# Patient Record
Sex: Female | Born: 1965
Health system: Southern US, Community
[De-identification: ages and names within clinical notes are randomized; demographics above are authoritative.]

## PROBLEM LIST (undated history)

## (undated) DIAGNOSIS — F419 Anxiety disorder, unspecified: Secondary | ICD-10-CM

## (undated) DIAGNOSIS — R059 Cough, unspecified: Secondary | ICD-10-CM

## (undated) DIAGNOSIS — E559 Vitamin D deficiency, unspecified: Secondary | ICD-10-CM

## (undated) DIAGNOSIS — R52 Pain, unspecified: Secondary | ICD-10-CM

## (undated) DIAGNOSIS — R1011 Right upper quadrant pain: Secondary | ICD-10-CM

## (undated) DIAGNOSIS — R599 Enlarged lymph nodes, unspecified: Secondary | ICD-10-CM

## (undated) DIAGNOSIS — R0602 Shortness of breath: Secondary | ICD-10-CM

## (undated) DIAGNOSIS — L299 Pruritus, unspecified: Secondary | ICD-10-CM

## (undated) DIAGNOSIS — M25511 Pain in right shoulder: Secondary | ICD-10-CM

## (undated) DIAGNOSIS — I2510 Atherosclerotic heart disease of native coronary artery without angina pectoris: Secondary | ICD-10-CM

## (undated) DIAGNOSIS — J4520 Mild intermittent asthma, uncomplicated: Secondary | ICD-10-CM

## (undated) DIAGNOSIS — E119 Type 2 diabetes mellitus without complications: Secondary | ICD-10-CM

## (undated) DIAGNOSIS — I749 Embolism and thrombosis of unspecified artery: Secondary | ICD-10-CM

## (undated) DIAGNOSIS — C569 Malignant neoplasm of unspecified ovary: Secondary | ICD-10-CM

## (undated) DIAGNOSIS — I219 Acute myocardial infarction, unspecified: Secondary | ICD-10-CM

## (undated) DIAGNOSIS — N182 Chronic kidney disease, stage 2 (mild): Secondary | ICD-10-CM

## (undated) DIAGNOSIS — J45909 Unspecified asthma, uncomplicated: Secondary | ICD-10-CM

## (undated) HISTORY — PX: APPENDECTOMY: SHX54

## (undated) HISTORY — PX: ABDOMINAL HYSTERECTOMY: SHX81

## (undated) HISTORY — DX: Chronic kidney disease, stage 2 (mild): N18.2

## (undated) HISTORY — DX: Atherosclerotic heart disease of native coronary artery without angina pectoris: I25.10

## (undated) HISTORY — DX: Malignant neoplasm of unspecified ovary: C56.9

---

## 2009-01-09 LAB — STREP THROAT SCREEN: Strep Screen: NEGATIVE

## 2009-10-01 LAB — URINALYSIS W/ RFLX MICROSCOPIC
Bilirubin: NEGATIVE
Glucose: NEGATIVE MG/DL
Ketone: NEGATIVE MG/DL
Leukocyte Esterase: NEGATIVE
Nitrites: NEGATIVE
Protein: NEGATIVE MG/DL
Specific gravity: >1.03 — ABNORMAL HIGH (ref 1.003–1.030)
Urobilinogen: 0.2 EU/DL (ref 0.2–1.0)
pH (UA): 6 (ref 5.0–8.0)

## 2009-10-01 LAB — DRUG SCREEN, URINE
AMPHETAMINES: NEGATIVE
BARBITURATES: NEGATIVE
BENZODIAZEPINES: POSITIVE — AB
COCAINE: NEGATIVE
METHADONE: NEGATIVE
OPIATES: NEGATIVE
PCP(PHENCYCLIDINE): NEGATIVE
THC (TH-CANNABINOL): NEGATIVE

## 2009-10-01 LAB — URINE MICROSCOPIC ONLY
RBC: 4 /HPF (ref 0–5)
WBC: NEGATIVE /HPF (ref 0–4)

## 2011-07-27 ENCOUNTER — Encounter

## 2011-07-27 NOTE — Procedures (Signed)
[facility]  *** FINAL REPORT ***    Name: CASSADEE, VANZANDT  MRN: [id number]  DOB: 1965-12-13  Visit: [id number]  Date: 27 Jul 2011    TYPE OF TEST: Peripheral Venous Testing    REASON FOR TEST  Pain in limb, Limb swelling    Right Leg:-  Deep venous thrombosis:           No  Superficial venous thrombosis:    No  Deep venous insufficiency:        No  Superficial venous insufficiency: No      INTERPRETATION/FINDINGS  PROCEDURE:  Evaluation of lower extremity veins with ultrasound  (B-mode imaging, pulsed Doppler, color Doppler).  Includes the common  femoral, deep femoral, femoral, popliteal, posterior tibial, peroneal,   and great saphenous veins.  Other veins, for example the  gastrocnemius and soleal veins, may also be visualized.    FINDINGS:       Right: Veins are compressible and there is no narrowing of the  flow channel on color Doppler imaging.  Phasic venous flow is  demonstrated.       Left:   The common femoral vein is patent, and phasic venous flow   is demonstrated.  This extremity was not otherwise evaluated.    IMPRESSION:  No evidence of right lower extremity vein  thrombosis.There is an incidental finding of a popliteal fossa fluid  collection.    ADDITIONAL COMMENTS    I have personally reviewed the data relevant to the interpretation of  this  study.    TECHNOLOGIST:  Zenaida Deed. Toone    PHYSICIAN:  Electronically signed by:  Gaetana Michaelis, MD  07/28/2011 06:42 AM

## 2011-07-28 NOTE — Procedures (Signed)
[facility]  *** FINAL REPORT ***    Name: Cathy [name], Cathy [name]  MRN: [id number]  DOB: 14 Sep 1965  Visit: [id number]  Date: 27 Jul 2011    TYPE OF TEST: Peripheral Venous Testing    REASON FOR TEST  Pain in limb, Limb swelling    Right Leg:-  Deep venous thrombosis:           No  Superficial venous thrombosis:    No  Deep venous insufficiency:        No  Superficial venous insufficiency: No      INTERPRETATION/FINDINGS  PROCEDURE:  Evaluation of lower extremity veins with ultrasound  (B-mode imaging, pulsed Doppler, color Doppler).  Includes the common  femoral, deep femoral, femoral, popliteal, posterior tibial, peroneal,   and great saphenous veins.  Other veins, for example the  gastrocnemius and soleal veins, may also be visualized.    FINDINGS:       Right: Veins are compressible and there is no narrowing of the  flow channel on color Doppler imaging.  Phasic venous flow is  demonstrated.       Left:   The common femoral vein is patent, and phasic venous flow   is demonstrated.  This extremity was not otherwise evaluated.    IMPRESSION:  No evidence of right lower extremity vein  thrombosis.There is an incidental finding of a popliteal fossa fluid  collection.    ADDITIONAL COMMENTS    I have personally reviewed the data relevant to the interpretation of  this  study.    TECHNOLOGIST:  Julie A. Toone    PHYSICIAN:  Electronically signed by:  Teddrick Mallari, MD  07/28/2011 06:42 AM

## 2011-10-29 ENCOUNTER — Encounter

## 2011-10-30 NOTE — Procedures (Signed)
[facility]  *** FINAL REPORT ***    Name: Cathy [name], Cathy [name]  MRN: [id number]  DOB: Apr 19, 1965  Visit: [id number]  Date: 29 Oct 2011    TYPE OF TEST: Peripheral Venous Testing    REASON FOR TEST  Pain in limb    Right Leg:-  Deep venous thrombosis:           No  Superficial venous thrombosis:    No  Deep venous insufficiency:        No  Superficial venous insufficiency: No      INTERPRETATION/FINDINGS  PROCEDURE:  Evaluation of lower extremity veins with ultrasound  (B-mode imaging, pulsed Doppler, color Doppler).  Includes the common  femoral, deep femoral, femoral, popliteal, posterior tibial, peroneal,   and great saphenous veins.  Other veins, for example the  gastrocnemius and soleal veins, may also be visualized.    FINDINGS:       Right: Veins are compressible and there is no narrowing of the  flow channel on color Doppler imaging.  Phasic venous flow is  demonstrated.       Left:   The common femoral vein is patent, and phasic venous flow   is demonstrated.  This extremity was not otherwise evaluated.    IMPRESSION:  No evidence of right lower extremity vein  thrombosis.There is an incidental finding of a popliteal fossa fluid  collection.    ADDITIONAL COMMENTS    I have personally reviewed the data relevant to the interpretation of  this  study.    TECHNOLOGIST:  Zenaida Deed. Toone    PHYSICIAN:  Electronically signed by:  Roylene Reason., MD  10/30/2011 06:48 AM

## 2011-10-30 NOTE — Procedures (Signed)
[facility]  *** FINAL REPORT ***    Name: [name], Cathy  MRN: [id number]  DOB: 14 Sep 1965  Visit: [id number]  Date: 29 Oct 2011    TYPE OF TEST: Peripheral Venous Testing    REASON FOR TEST  Pain in limb    Right Leg:-  Deep venous thrombosis:           No  Superficial venous thrombosis:    No  Deep venous insufficiency:        No  Superficial venous insufficiency: No      INTERPRETATION/FINDINGS  PROCEDURE:  Evaluation of lower extremity veins with ultrasound  (B-mode imaging, pulsed Doppler, color Doppler).  Includes the common  femoral, deep femoral, femoral, popliteal, posterior tibial, peroneal,   and great saphenous veins.  Other veins, for example the  gastrocnemius and soleal veins, may also be visualized.    FINDINGS:       Right: Veins are compressible and there is no narrowing of the  flow channel on color Doppler imaging.  Phasic venous flow is  demonstrated.       Left:   The common femoral vein is patent, and phasic venous flow   is demonstrated.  This extremity was not otherwise evaluated.    IMPRESSION:  No evidence of right lower extremity vein  thrombosis.There is an incidental finding of a popliteal fossa fluid  collection.    ADDITIONAL COMMENTS    I have personally reviewed the data relevant to the interpretation of  this  study.    TECHNOLOGIST:  Julie A. Toone    PHYSICIAN:  Electronically signed by:  Jakaylah Schlafer Stoneburner, Jr., MD  10/30/2011 06:48 AM

## 2014-01-03 ENCOUNTER — Encounter

## 2014-01-03 ENCOUNTER — Inpatient Hospital Stay: Admit: 2014-01-03 | Payer: Self-pay | Primary: Internal Medicine

## 2014-01-03 DIAGNOSIS — M19011 Primary osteoarthritis, right shoulder: Secondary | ICD-10-CM

## 2014-03-02 ENCOUNTER — Encounter

## 2014-03-02 ENCOUNTER — Inpatient Hospital Stay: Admit: 2014-03-02 | Payer: Charity | Primary: Internal Medicine

## 2014-03-02 DIAGNOSIS — R05 Cough: Secondary | ICD-10-CM

## 2015-04-06 ENCOUNTER — Inpatient Hospital Stay (HOSPITAL_COMMUNITY)
Admission: EM | Admit: 2015-04-06 | Discharge: 2015-04-15 | DRG: 234 | Disposition: A | Discharge status: Home Health | Payer: Self-pay | Attending: Cardiothoracic Surgery | Admitting: Cardiothoracic Surgery

## 2015-04-06 ENCOUNTER — Emergency Department (HOSPITAL_COMMUNITY): Payer: Self-pay

## 2015-04-06 ENCOUNTER — Encounter (HOSPITAL_COMMUNITY): Payer: Self-pay | Admitting: Nurse Practitioner

## 2015-04-06 DIAGNOSIS — I2510 Atherosclerotic heart disease of native coronary artery without angina pectoris: Secondary | ICD-10-CM

## 2015-04-06 DIAGNOSIS — Z720 Tobacco use: Secondary | ICD-10-CM

## 2015-04-06 DIAGNOSIS — T463X5A Adverse effect of coronary vasodilators, initial encounter: Secondary | ICD-10-CM | POA: Diagnosis not present

## 2015-04-06 DIAGNOSIS — Z9109 Other allergy status, other than to drugs and biological substances: Secondary | ICD-10-CM

## 2015-04-06 DIAGNOSIS — Z79899 Other long term (current) drug therapy: Secondary | ICD-10-CM

## 2015-04-06 DIAGNOSIS — I119 Hypertensive heart disease without heart failure: Secondary | ICD-10-CM | POA: Insufficient documentation

## 2015-04-06 DIAGNOSIS — J45991 Cough variant asthma: Secondary | ICD-10-CM

## 2015-04-06 DIAGNOSIS — I252 Old myocardial infarction: Secondary | ICD-10-CM

## 2015-04-06 DIAGNOSIS — F909 Attention-deficit hyperactivity disorder, unspecified type: Secondary | ICD-10-CM

## 2015-04-06 DIAGNOSIS — I214 Non-ST elevation (NSTEMI) myocardial infarction: Principal | ICD-10-CM | POA: Diagnosis present

## 2015-04-06 DIAGNOSIS — Z794 Long term (current) use of insulin: Secondary | ICD-10-CM

## 2015-04-06 DIAGNOSIS — F419 Anxiety disorder, unspecified: Secondary | ICD-10-CM | POA: Diagnosis present

## 2015-04-06 DIAGNOSIS — F17210 Nicotine dependence, cigarettes, uncomplicated: Secondary | ICD-10-CM

## 2015-04-06 DIAGNOSIS — E785 Hyperlipidemia, unspecified: Secondary | ICD-10-CM

## 2015-04-06 DIAGNOSIS — E669 Obesity, unspecified: Secondary | ICD-10-CM | POA: Diagnosis present

## 2015-04-06 DIAGNOSIS — Z90710 Acquired absence of both cervix and uterus: Secondary | ICD-10-CM

## 2015-04-06 DIAGNOSIS — D649 Anemia, unspecified: Secondary | ICD-10-CM | POA: Diagnosis not present

## 2015-04-06 DIAGNOSIS — J45909 Unspecified asthma, uncomplicated: Secondary | ICD-10-CM | POA: Diagnosis present

## 2015-04-06 DIAGNOSIS — E1165 Type 2 diabetes mellitus with hyperglycemia: Secondary | ICD-10-CM | POA: Diagnosis present

## 2015-04-06 DIAGNOSIS — Z6830 Body mass index (BMI) 30.0-30.9, adult: Secondary | ICD-10-CM

## 2015-04-06 DIAGNOSIS — E119 Type 2 diabetes mellitus without complications: Secondary | ICD-10-CM

## 2015-04-06 DIAGNOSIS — R0789 Other chest pain: Secondary | ICD-10-CM

## 2015-04-06 DIAGNOSIS — Z951 Presence of aortocoronary bypass graft: Secondary | ICD-10-CM

## 2015-04-06 DIAGNOSIS — I200 Unstable angina: Secondary | ICD-10-CM | POA: Insufficient documentation

## 2015-04-06 DIAGNOSIS — Y92239 Unspecified place in hospital as the place of occurrence of the external cause: Secondary | ICD-10-CM | POA: Diagnosis not present

## 2015-04-06 DIAGNOSIS — R079 Chest pain, unspecified: Secondary | ICD-10-CM

## 2015-04-06 DIAGNOSIS — I25110 Atherosclerotic heart disease of native coronary artery with unstable angina pectoris: Secondary | ICD-10-CM | POA: Diagnosis present

## 2015-04-06 DIAGNOSIS — R0602 Shortness of breath: Secondary | ICD-10-CM

## 2015-04-06 DIAGNOSIS — R51 Headache: Secondary | ICD-10-CM | POA: Diagnosis not present

## 2015-04-06 HISTORY — DX: Acute myocardial infarction, unspecified: I21.9

## 2015-04-06 HISTORY — DX: Type 2 diabetes mellitus without complications: E11.9

## 2015-04-06 HISTORY — DX: Atherosclerotic heart disease of native coronary artery without angina pectoris: I25.10

## 2015-04-06 HISTORY — DX: Unspecified asthma, uncomplicated: J45.909

## 2015-04-06 LAB — BASIC METABOLIC PANEL
ANION GAP: 10 (ref 5–15)
BUN: 13 mg/dL (ref 6–20)
CO2: 23 mmol/L (ref 22–32)
Calcium: 9.3 mg/dL (ref 8.9–10.3)
Chloride: 104 mmol/L (ref 101–111)
Creatinine, Ser: 0.71 mg/dL (ref 0.44–1.00)
GFR calc Af Amer: >60 mL/min (ref >60)
GFR calc non Af Amer: >60 mL/min (ref >60)
GLUCOSE: 211 mg/dL — AB (ref 65–99)
POTASSIUM: 4.2 mmol/L (ref 3.5–5.1)
Sodium: 137 mmol/L (ref 135–145)

## 2015-04-06 LAB — CBC
HEMATOCRIT: 39.8 % (ref 36.0–46.0)
Hemoglobin: 13.5 g/dL (ref 12.0–15.0)
MCH: 29.2 pg (ref 26.0–34.0)
MCHC: 33.9 g/dL (ref 30.0–36.0)
MCV: 86 fL (ref 78.0–100.0)
Platelets: 315 10*3/uL (ref 150–400)
RBC: 4.63 MIL/uL (ref 3.87–5.11)
RDW: 12.9 % (ref 11.5–15.5)
WBC: 6.2 10*3/uL (ref 4.0–10.5)

## 2015-04-06 LAB — LIPID PANEL
Cholesterol: 204 mg/dL — ABNORMAL HIGH (ref 0–200)
HDL: 48 mg/dL (ref >40)
LDL Cholesterol: 103 mg/dL — ABNORMAL HIGH (ref 0–99)
Total CHOL/HDL Ratio: 4.3 RATIO
Triglycerides: 266 mg/dL — ABNORMAL HIGH (ref <150)
VLDL: 53 mg/dL — ABNORMAL HIGH (ref 0–40)

## 2015-04-06 LAB — I-STAT TROPONIN, ED: Troponin i, poc: 0.03 ng/mL (ref 0.00–0.08)

## 2015-04-06 LAB — GLUCOSE, CAPILLARY: GLUCOSE-CAPILLARY: 224 mg/dL — AB (ref 65–99)

## 2015-04-06 MED ORDER — ONDANSETRON HCL 4 MG/2ML IJ SOLN: 4.0000 mg | Freq: Four times a day (QID) | INTRAMUSCULAR | Status: DC | PRN

## 2015-04-06 MED ORDER — HYDROCODONE-ACETAMINOPHEN 5-325 MG PO TABS
1.0000 | ORAL_TABLET | ORAL | Status: DC | PRN
  Administered 2015-04-07 (×2): 1 via ORAL
  Filled 2015-04-06 (×3): qty 1

## 2015-04-06 MED ORDER — CYCLOBENZAPRINE HCL 10 MG PO TABS
10.0000 mg | ORAL_TABLET | Freq: Three times a day (TID) | ORAL | Status: DC | PRN
  Filled 2015-04-06: qty 1

## 2015-04-06 MED ORDER — ALBUTEROL SULFATE (2.5 MG/3ML) 0.083% IN NEBU: 2.5000 mg | INHALATION_SOLUTION | Freq: Four times a day (QID) | RESPIRATORY_TRACT | Status: DC | PRN

## 2015-04-06 MED ORDER — NITROGLYCERIN 2 % TD OINT
0.5000 [in_us] | TOPICAL_OINTMENT | Freq: Four times a day (QID) | TRANSDERMAL | Status: DC
  Administered 2015-04-06 – 2015-04-09 (×11): 0.5 [in_us] via TOPICAL
  Filled 2015-04-06 (×2): qty 0.5

## 2015-04-06 MED ORDER — MORPHINE SULFATE (PF) 2 MG/ML IV SOLN
2.0000 mg | INTRAVENOUS | Status: DC | PRN
Start: 2015-04-06 — End: 2015-04-11
  Administered 2015-04-07 – 2015-04-10 (×4): 2 mg via INTRAVENOUS
  Filled 2015-04-06 (×4): qty 1

## 2015-04-06 MED ORDER — ASPIRIN 81 MG PO CHEW
324.0000 mg | CHEWABLE_TABLET | Freq: Once | ORAL | Status: AC
  Administered 2015-04-06: 324 mg via ORAL
  Filled 2015-04-06: qty 4

## 2015-04-06 MED ORDER — INSULIN ASPART 100 UNIT/ML ~~LOC~~ SOLN
0.0000 [IU] | Freq: Three times a day (TID) | SUBCUTANEOUS | Status: DC
  Administered 2015-04-07: 3 [IU] via SUBCUTANEOUS

## 2015-04-06 MED ORDER — ROSUVASTATIN CALCIUM 10 MG PO TABS
10.0000 mg | ORAL_TABLET | Freq: Every day | ORAL | Status: DC
  Administered 2015-04-06 – 2015-04-07 (×2): 10 mg via ORAL
  Filled 2015-04-06 (×2): qty 1

## 2015-04-06 MED ORDER — INSULIN GLARGINE 100 UNIT/ML ~~LOC~~ SOLN
20.0000 [IU] | Freq: Every day | SUBCUTANEOUS | Status: DC
  Administered 2015-04-06 – 2015-04-10 (×5): 20 [IU] via SUBCUTANEOUS
  Filled 2015-04-06 (×6): qty 0.2

## 2015-04-06 MED ORDER — ENOXAPARIN SODIUM 100 MG/ML ~~LOC~~ SOLN
1.0000 mg/kg | Freq: Once | SUBCUTANEOUS | Status: AC
  Administered 2015-04-06: 85 mg via SUBCUTANEOUS
  Filled 2015-04-06: qty 1

## 2015-04-06 MED ORDER — ACETAMINOPHEN 325 MG PO TABS
650.0000 mg | ORAL_TABLET | ORAL | Status: DC | PRN
  Administered 2015-04-06: 650 mg via ORAL
  Filled 2015-04-06: qty 2

## 2015-04-06 MED ORDER — INSULIN ASPART 100 UNIT/ML ~~LOC~~ SOLN
5.0000 [IU] | Freq: Once | SUBCUTANEOUS | Status: AC
  Administered 2015-04-06: 5 [IU] via SUBCUTANEOUS

## 2015-04-06 NOTE — ED Provider Notes (Signed)
CSN: [id number]     Arrival date & time 04/06/15  1314 History   First MD Initiated Contact with Patient 04/06/15 1508     Chief Complaint: Chest tightness    HPI 50 year old female with history of asthma, on home inhalers with which he reports compliance, presenting with chest tightness and shortness of breath with exertion for the last 4 days. Patient reports that symptoms only occur when she is "having sexual relations with my husband" or going upstairs. Pain does not radiate. Not accompanied by nausea or vomiting. She does report "having a mild heart attack" at an outside hospital several years ago and is supposed be taking 81 mg of aspirin daily, but does not take it. She has never seen a cardiologist. As never had a stress test or cardiac catheterization. Denies history of DVT or PE. Pain is not pleuritic. Feels similar to the shortness of breath that she experiences with her asthma but is not improved with home inhalers. She was diagnosed with bronchitis 10 days ago but reports that her cough is now resolved.  Past Medical History  Diagnosis Date  . Asthma   . Diabetes mellitus without complication ([place])   . Myocardial infarction [facility])    Past Surgical History  Procedure Laterality Date  . Abdominal hysterectomy     History reviewed. No pertinent family history. Social History  Substance Use Topics  . Smoking status: Current Every Day Smoker    Types: Cigarettes  . Smokeless tobacco: None  . Alcohol Use: Yes   OB History    No data available     Review of Systems  Constitutional: Negative for fever, chills, activity change and appetite change.  HENT: Negative for congestion, rhinorrhea and sore throat.   Eyes: Negative for visual disturbance.  Respiratory: Positive for chest tightness and shortness of breath. Negative for cough and wheezing.   Cardiovascular: Positive for chest pain. Negative for palpitations.  Gastrointestinal: Negative for nausea, vomiting, abdominal  pain, diarrhea, constipation, blood in stool and abdominal distention.  Genitourinary: Negative for dysuria, frequency and flank pain.  Musculoskeletal: Negative for myalgias, back pain, joint swelling, arthralgias, gait problem, neck pain and neck stiffness.  Skin: Negative for rash.  Neurological: Negative for dizziness, tremors, syncope, facial asymmetry, speech difficulty, weakness, numbness and headaches.  Psychiatric/Behavioral: Negative for behavioral problems, confusion and agitation.      Allergies  Pyridium  Home Medications   Prior to Admission medications   Not on File   BP 152/91 mmHg  Pulse 72  Temp(Src) 98.1 F (36.7 C) (Oral)  Resp 16  SpO2 100% Physical Exam  Constitutional: She is oriented to person, place, and time. She appears well-developed and well-nourished. No distress.  HENT:  Head: Normocephalic and atraumatic.  Right Ear: External ear normal.  Left Ear: External ear normal.  Nose: Nose normal.  Mouth/Throat: Oropharynx is clear and moist. No oropharyngeal exudate.  Eyes: Conjunctivae and EOM are normal. Pupils are equal, round, and reactive to light. Right eye exhibits no discharge. Left eye exhibits no discharge.  Neck: Normal range of motion. Neck supple.  Cardiovascular: Normal rate, regular rhythm and normal heart sounds.  Exam reveals no gallop and no friction rub.   No murmur heard. Pulmonary/Chest: Effort normal and breath sounds normal. No respiratory distress. She has no wheezes. She has no rales. She exhibits no tenderness.  Abdominal: Soft. Bowel sounds are normal. She exhibits no distension and no mass. There is no tenderness. There is no rebound and no  guarding.  Musculoskeletal: Normal range of motion. She exhibits no edema or tenderness.  Neurological: She is alert and oriented to person, place, and time. She exhibits normal muscle tone.  Skin: Skin is warm and dry. No rash noted. She is not diaphoretic.  Psychiatric: She has a normal  mood and affect. Her behavior is normal. Judgment and thought content normal.    ED Course  Procedures (including critical care time) Labs Review Labs Reviewed  BASIC METABOLIC PANEL - Abnormal; Notable for the following:    Glucose, Bld 211 (*)    All other components within normal limits  CBC  I-STAT TROPOININ, ED    Imaging Review Dg Chest 2 View  04/06/2015  CLINICAL DATA:  Pt reports chest tightness x 3-4 days; pt reports h/o DM, asthma, and MI 2 yrs ago; smoker EXAM: CHEST  2 VIEW COMPARISON:  None. FINDINGS: The heart size and mediastinal contours are within normal limits. Both lungs are clear. No pleural effusion or pneumothorax. The visualized skeletal structures are unremarkable. IMPRESSION: No active cardiopulmonary disease. Electronically Signed   By: Lajean Manes M.D.   On: 04/06/2015 15:03   I have personally reviewed and evaluated these images and lab results as part of my medical decision-making.   EKG Interpretation   Date/Time:  Saturday April 06 2015 13:21:51 EDT Ventricular Rate:  70 PR Interval:  146 QRS Duration: 80 QT Interval:  404 QTC Calculation: 436 R Axis:   64 Text Interpretation:  Normal sinus rhythm Normal ECG No old tracing to  compare Confirmed by Winfred Leeds  MD, SAM [phone]) on 04/06/2015 3:52:32 PM      MDM   Final diagnoses:  Chest tightness or pressure   Patient is generally well-appearing. Asymptomatic on arrival. Chest pain is present only with exertion and accompanied by shortness of breath. No pleuritic nature of pain, SPO2 100% on room air, she is not tachycardic, and doubt pulmonary embolism at this time. Given her risk factors, she has a HEAR score of 4. She has not had a stress test within the last year. Will admit to the hospitalist service for chest pain observation, delta troponin, and stress testing. Patient was given 324 mg of aspirin while in the emergency department. CXR unremarkable. Remained hemodynamically stable for  admission.   Jenifer Algernon Huxley, MD 04/06/15 [place], MD 04/06/15 [id number]

## 2015-04-06 NOTE — ED Notes (Signed)
Admitting at bedside 

## 2015-04-06 NOTE — ED Provider Notes (Signed)
Complains of tightness at anterior chest, nonradiating brought on by exertion improved with rest onset 4 days ago. Does not feel like asthma she's had in the past. Presently asymptomatic. No treatment prior to coming here can't risk factors include smoker hyperlipidemia diabetes Chest x-ray viewed by me. Heart score equals 4  Orlie Dakin, MD 04/06/15 [phone]

## 2015-04-06 NOTE — ED Notes (Addendum)
She c/o 3 day history chest pain and tightness with exertion. She has asthma, shes tried her breathing txs with no relief. She denies cough, fevers, congestion. Pain increased severely during sex yesterday. She is A&Ox4 and breathing easily now

## 2015-04-06 NOTE — ED Notes (Signed)
MD at bedside. 

## 2015-04-06 NOTE — Consult Note (Signed)
Cardiology Consult    Patient ID: Kimberly [name] MRN: [id number], DOB/AGE: 02-19-1965   Admit date: 04/06/2015 Date of Consult: 04/06/2015  Primary Physician: No primary care provider on file. Primary Cardiologist: None Requesting Provider: Dr. Eulas Post  Patient Profile  The patient is a 50 year old woman with a history of diabetes, hypercholesterolemia, and current tobacco use presenting with episodic chest discomfort 1 week.   Past Medical History   Past Medical History  Diagnosis Date  . Asthma   . Diabetes mellitus without complication ([place])   . Myocardial infarction [facility])     Past Surgical History  Procedure Laterality Date  . Abdominal hysterectomy    . Appendectomy       Allergies  Allergies  Allergen Reactions  . Pyridium [Phenazopyridine Hcl] Itching    History of Present Illness    The patient is a 50 year old woman with a history of diabetes on insulin, hypercholesterolemia, and current smoker who presents with 1 week of episodic chest discomfort.  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  She has noticed episodic chest discomfort, which she describes as squeezing sensation, for the past 6 days or so.  Her symptoms seem to get worse with exertion and improved with rest.  She also has symptoms in the setting of emotional stress as well.  She reports that her worst symptoms occurred yesterday after sexual intercourse.  Given the fact that she continued to have symptoms, she reported to the emergency room for further evaluation and management.  Regarding risk factors, she has a history of diabetes for which she is being treated with insulin, dyslipidemia, and current smoker.  She smokes about half pack per day currently though she has smoked close to a pack a day in the past.  She has a 30 year smoking history.  Given her symptoms, she was admitted to the hospitalist service.  She is being treated with enoxaparin, statin, and  Nitropaste.  Her first set of cardiac biomarkers was negative.  EKG demonstrated normal sinus rhythm without acute ST-T wave changes concerning for ischemia.  We are consulted regarding additional evaluation and management of her chest discomfort.  Inpatient Medications    . [START ON 04/07/2015] insulin aspart  0-9 Units Subcutaneous TID WC  . insulin glargine  20 Units Subcutaneous QHS  . nitroGLYCERIN  0.5 inch Topical 4 times per day  . rosuvastatin  10 mg Oral Daily    Family History    Family History  Problem Relation Age of Onset  . Heart disease      grandfather  . Diabetes      mother    Social History    Social History   Social History  . Marital Status: Single    Spouse Name: N/A  . Number of Children: N/A  . Years of Education: N/A   Occupational History  . Not on file.   Social History Main Topics  . Smoking status: Current Every Day Smoker    Types: Cigarettes  . Smokeless tobacco: Not on file  . Alcohol Use: Yes  . Drug Use: No  . Sexual Activity: Not on file   Other Topics Concern  . Not on file   Social History Narrative  . No narrative on file     Review of Systems    General:  No chills, fever, night sweats or weight changes.  Cardiovascular:  +chest pain,+ dyspnea on exertion, -edema,- orthopnea, -palpitations, -paroxysmal nocturnal dyspnea. Dermatological: No rash,  lesions/masses Respiratory: No cough, dyspnea Urologic: No hematuria, dysuria Abdominal:   No nausea, vomiting, diarrhea, bright red blood per rectum, melena, or hematemesis Neurologic:  No visual changes, wkns, changes in mental status. All other systems reviewed and are otherwise negative except as noted above.  Physical Exam    Blood pressure 115/58, pulse 72, temperature 98.2 F (36.8 C), temperature source Oral, resp. rate 18, weight 188 lb 4.4 oz (85.4 kg), SpO2 100 %.  General: Pleasant, NAD Psych: Normal affect. Neuro: Alert and oriented X 3. Moves all extremities  spontaneously. HEENT: Normal  Neck: Supple without bruits or JVD. Lungs:  Resp regular and unlabored, CTA. Heart: RRR no s3, s4, or murmurs. Abdomen: Soft, non-tender, non-distended, BS + x 4.  Extremities: No clubbing, cyanosis or edema. DP/PT/Radials 2+ and equal bilaterally.  Labs    Troponin [facility] of Care Test)  Recent Labs  04/06/15 1444  TROPIPOC 0.03   No results for input(s): CKTOTAL, CKMB, TROPONINI in the last 72 hours. Lab Results  Component Value Date   WBC 6.2 04/06/2015   HGB 13.5 04/06/2015   HCT 39.8 04/06/2015   MCV 86.0 04/06/2015   PLT 315 04/06/2015    Recent Labs Lab 04/06/15 1420  NA 137  K 4.2  CL 104  CO2 23  BUN 13  CREATININE 0.71  CALCIUM 9.3  GLUCOSE 211*   Lab Results  Component Value Date   CHOL 204* 04/06/2015   HDL 48 04/06/2015   LDLCALC 103* 04/06/2015   TRIG 266* 04/06/2015      Radiology Studies    Dg Chest 2 View  04/06/2015  CLINICAL DATA:  Pt reports chest tightness x 3-4 days; pt reports h/o DM, asthma, and MI 2 yrs ago; smoker EXAM: CHEST  2 VIEW COMPARISON:  None. FINDINGS: The heart size and mediastinal contours are within normal limits. Both lungs are clear. No pleural effusion or pneumothorax. The visualized skeletal structures are unremarkable. IMPRESSION: No active cardiopulmonary disease. Electronically Signed   By: Lajean Manes M.D.   On: 04/06/2015 15:03    ECG & Cardiac Imaging    Normal sinus rhythm  Assessment & Plan   The patient is a 50 year old woman with a history of diabetes, hypercholesterolemia, and current tobacco use presenting with episodic chest discomfort 1 week.  Differential diagnosis includes musculoskeletal pain, GERD, esophageal spasm, coronary artery disease, pneumonia, atypical chest pain, pulmonary embolism, and other misc etiologies. Her symptoms are concerning for cardiac etiology.She does have typical anginal symptoms.  Since her symptoms are brought on more by emotional factors  then by physical stress.  She has multiple risk factors including diabetes, hypercholesterolemia, and current active smoking.  If her biomarkers continue to be negative, can consider noninvasive ischemia assessment first, but may also be reasonable to proceed directly with cath given her multiple risk factors.  - aspirin 81 mg PO daily - please anticoagulate with heparin gtt  - defer P2Y12 inhibitor for now - atorvastatin 80 mg qHS  - tsh, BNP, hba1c, lipid in AM, Mg2+, update CMP - please obtain TTE in AM - NPO after MN tomorrow for possible LHC vs stress testing  - please continue to cycle cardiac biomarkers  Signed, Raliegh Ip, MD MPH 04/06/2015, 9:54 PM

## 2015-04-06 NOTE — H&P (Signed)
[facility] History and Physical  Dary Kretschmer [id number] DOB: 04-07-1965 DOA: 04/06/2015  Referring physician: Emergency Department PCP: No primary care provider on file.   CHIEF COMPLAINT:    Chest pain               HPI: Kimberly [name] is a 50 y.o. female  who recently relocated here from [place]. Patient in the ED with a four-day history of substernal chest pain and tightness radiating through / around to back. Pain occurs with light activity such as walking, it resolves at rest. She has a history of asthma but not having any associated SOB or coughing. Pain not affected by deep inspiration. Never had this chest pain before. No associated nausea or diaphoresis.  Patient gives a history of a "light heart attack" a few years ago.        ED COURSE:           Workup:  Troponin normal Labs normal except glucose of 211. No acute findings on CXR. NSR on EKG.   Medications  aspirin chewable tablet 324 mg (324 mg Oral Given 04/06/15 1606)   Review of Systems  Constitutional: Negative.   HENT: Negative.   Eyes: Negative.   Respiratory: Negative.   Cardiovascular: Positive for chest pain.  Gastrointestinal: Negative.   Genitourinary: Negative.   Musculoskeletal: Negative.   Skin: Negative.   Neurological: Negative.   Endo/Heme/Allergies: Negative.   Psychiatric/Behavioral: Negative.     Past Medical History  Diagnosis Date  . Asthma   . Diabetes mellitus without complication ([place])   . Myocardial infarction [facility])    Past Surgical History  Procedure Laterality Date  . Abdominal hysterectomy      SOCIAL HISTORY:  reports that she has been smoking Cigarettes.  She does not have any smokeless tobacco history on file. She reports that she drinks alcohol. She reports that she does not use illicit drugs. Lives:  At home with husband   Assistive devices:   None needed for ambulation.   Allergies  Allergen Reactions  . Pyridium [Phenazopyridine Hcl] Itching    FMH:   Mother -diabetes.            Grandfather - Heart disease  PHYSICAL EXAM: Filed Vitals:   04/06/15 1402 04/06/15 1542 04/06/15 1543  BP: 154/71 152/91   Pulse: 72  72  Temp: 98.1 F (36.7 C)    TempSrc: Oral    Resp: 18 14 16   SpO2: 100%  100%    Wt Readings from Last 3 Encounters:  No data found for Wt    General:  Pleasant  black  female. Appears calm and comfortable Eyes: PER, normal lids, irises & conjunctiva ENT: grossly normal hearing, lips & tongue Neck: no LAD, no masses Cardiovascular: RRR, no murmurs. No LE edema.  Respiratory: Respirations even and unlabored. Normal respiratory effort. Lungs CTA bilaterally, no wheezes / rales .   Abdomen: soft, non-distended, non-tender, active bowel sounds. No obvious masses.  Skin: no rash seen on limited exam Musculoskeletal: grossly normal tone BUE/BLE Psychiatric: grossly normal mood and affect, speech fluent and appropriate Neurologic: grossly non-focal.         LABS ON ADMISSION:    Basic Metabolic Panel:  Recent Labs Lab 04/06/15 1420  NA 137  K 4.2  CL 104  CO2 23  GLUCOSE 211*  BUN 13  CREATININE 0.71  CALCIUM 9.3   CBC:  Recent Labs Lab 04/06/15 1420  WBC 6.2  HGB 13.5  HCT 39.8  MCV 86.0  PLT 315  Creatinine clearance cannot be calculated (Unknown ideal weight.)  Radiological Exams on Admission: Dg Chest 2 View  04/06/2015  CLINICAL DATA:  Pt reports chest tightness x 3-4 days; pt reports h/o DM, asthma, and MI 2 yrs ago; smoker EXAM: CHEST  2 VIEW COMPARISON:  None. FINDINGS: The heart size and mediastinal contours are within normal limits. Both lungs are clear. No pleural effusion or pneumothorax. The visualized skeletal structures are unremarkable. IMPRESSION: No active cardiopulmonary disease. Electronically Signed   By: Lajean Manes M.D.   On: 04/06/2015 15:03    ASSESSMENT / PLAN   Substernal chest tightness / pain with minimal exertion. Heart score 5. Risk factors for CAD: diabetes,  smoking, hyperlipidemia. CXR, EKG, and initial troponin normal. Rule out ACS. Musculoskeletal pain? GERD less likely.  -admit for Observation - Telemetry. Chest pain order set used.  -Given symptoms / risk factors will give Lovenox 1mg /kg x 1 dose now pending Cardiology   evaluation. She received a full ASA in ED -Nitro paste Q6 hours -Cardiology consult -cycle troponins -repeat EKG in am  Diabetes mellitus, type 2.  -obtain Hgb A1c -Hold home basal insulin in case Cardiology wants to do stress test tomorrow. -CBGs, SSI  Hyperlipidemia, out of home Crestor.  -Resume Crestor today. Her home dose is unknown, will start her at 10mg  daily and get    -fasting am lipids .  ADHD (attention deficit hyperactivity disorder). Out of Ritalin at present.      Tobacco abuse.  -Smoking cessation by RN prior to discharge  Asthma, Stable. Asymptomatic.  -continue home prn Proventil  CONSULTANTS:   Cardiology   Code Status: full code DVT Prophylaxis: Will give dose of Lovenox 1mg /kg for chest pain now. Cards to see. SCDs Family Communication:  Patient alert, oriented and understands plan of care.  Disposition Plan: Discharge to home in 24-48 hours   Time spent: 60 minutes Tye Savoy  NP [facility] Pager [phone]

## 2015-04-06 NOTE — ED Notes (Signed)
Patient came up to me and stated "I'm going to get me something to eat because I haven't eaten today"; Tressia Miners, RN aware

## 2015-04-07 DIAGNOSIS — E785 Hyperlipidemia, unspecified: Secondary | ICD-10-CM

## 2015-04-07 DIAGNOSIS — I209 Angina pectoris, unspecified: Secondary | ICD-10-CM

## 2015-04-07 DIAGNOSIS — I119 Hypertensive heart disease without heart failure: Secondary | ICD-10-CM | POA: Insufficient documentation

## 2015-04-07 DIAGNOSIS — Z720 Tobacco use: Secondary | ICD-10-CM

## 2015-04-07 DIAGNOSIS — E1100 Type 2 diabetes mellitus with hyperosmolarity without nonketotic hyperglycemic-hyperosmolar coma (NKHHC): Secondary | ICD-10-CM

## 2015-04-07 LAB — PROTIME-INR
INR: 1.01 (ref 0.00–1.49)
PROTHROMBIN TIME: 13.5 s (ref 11.6–15.2)

## 2015-04-07 LAB — GLUCOSE, CAPILLARY
GLUCOSE-CAPILLARY: 166 mg/dL — AB (ref 65–99)
GLUCOSE-CAPILLARY: 146 mg/dL — AB (ref 65–99)
Glucose-Capillary: 224 mg/dL — ABNORMAL HIGH (ref 65–99)
Glucose-Capillary: 170 mg/dL — ABNORMAL HIGH (ref 65–99)

## 2015-04-07 LAB — TROPONIN I
TROPONIN I: 0.03 ng/mL (ref <0.031)
TROPONIN I: 0.03 ng/mL (ref <0.031)
Troponin I: <0.03 ng/mL (ref <0.031)

## 2015-04-07 MED ORDER — INSULIN ASPART 100 UNIT/ML ~~LOC~~ SOLN
0.0000 [IU] | Freq: Three times a day (TID) | SUBCUTANEOUS | Status: DC
  Administered 2015-04-07 (×2): 2 [IU] via SUBCUTANEOUS
  Administered 2015-04-08 – 2015-04-09 (×2): 1 [IU] via SUBCUTANEOUS
  Administered 2015-04-09 – 2015-04-10 (×5): 2 [IU] via SUBCUTANEOUS

## 2015-04-07 MED ORDER — SODIUM CHLORIDE 0.9 % WEIGHT BASED INFUSION: 1.0000 mL/kg/h | INTRAVENOUS | Status: DC

## 2015-04-07 MED ORDER — HYDROCODONE-ACETAMINOPHEN 5-325 MG PO TABS
1.0000 | ORAL_TABLET | ORAL | Status: DC | PRN
  Administered 2015-04-07: 1 via ORAL
  Administered 2015-04-07 – 2015-04-09 (×7): 2 via ORAL
  Administered 2015-04-10: 1 via ORAL
  Filled 2015-04-07 (×2): qty 2
  Filled 2015-04-07: qty 1
  Filled 2015-04-07 (×6): qty 2

## 2015-04-07 MED ORDER — INSULIN ASPART 100 UNIT/ML ~~LOC~~ SOLN
5.0000 [IU] | Freq: Three times a day (TID) | SUBCUTANEOUS | Status: DC
  Administered 2015-04-07 – 2015-04-10 (×8): 5 [IU] via SUBCUTANEOUS

## 2015-04-07 MED ORDER — SODIUM CHLORIDE 0.9% FLUSH: 3.0000 mL | INTRAVENOUS | Status: DC | PRN

## 2015-04-07 MED ORDER — SODIUM CHLORIDE 0.9% FLUSH
3.0000 mL | Freq: Two times a day (BID) | INTRAVENOUS | Status: DC
  Administered 2015-04-07: 3 mL via INTRAVENOUS

## 2015-04-07 MED ORDER — SODIUM CHLORIDE 0.9 % IV SOLN: 250.0000 mL | INTRAVENOUS | Status: DC | PRN

## 2015-04-07 MED ORDER — CARVEDILOL 3.125 MG PO TABS
3.1250 mg | ORAL_TABLET | Freq: Two times a day (BID) | ORAL | Status: DC
  Administered 2015-04-07 – 2015-04-09 (×3): 3.125 mg via ORAL
  Filled 2015-04-07 (×4): qty 1

## 2015-04-07 MED ORDER — SODIUM CHLORIDE 0.9 % WEIGHT BASED INFUSION
3.0000 mL/kg/h | INTRAVENOUS | Status: DC
  Administered 2015-04-08: 3 mL/kg/h via INTRAVENOUS

## 2015-04-07 MED ORDER — ASPIRIN 81 MG PO CHEW
81.0000 mg | CHEWABLE_TABLET | Freq: Every day | ORAL | Status: DC
  Administered 2015-04-07 – 2015-04-10 (×4): 81 mg via ORAL
  Filled 2015-04-07 (×4): qty 1

## 2015-04-07 MED ORDER — ROSUVASTATIN CALCIUM 10 MG PO TABS
20.0000 mg | ORAL_TABLET | Freq: Every day | ORAL | Status: DC
  Administered 2015-04-08: 20 mg via ORAL
  Filled 2015-04-07: qty 2

## 2015-04-07 MED ORDER — INSULIN ASPART 100 UNIT/ML ~~LOC~~ SOLN
0.0000 [IU] | Freq: Every day | SUBCUTANEOUS | Status: DC
  Administered 2015-04-10: 2 [IU] via SUBCUTANEOUS

## 2015-04-07 NOTE — Progress Notes (Signed)
Dr. Candiss Norse in to see pt, since she has already finished her breakfast, he advised to resume her previous diet order and to defer to cardiology to update diet as needed for procedures.

## 2015-04-07 NOTE — Progress Notes (Signed)
Dr. Candiss Norse called to notify that he is changing pt's diet status to NPO, she was finishing breakfast when I came into room to let her know.  She is now NPO as of 0755.

## 2015-04-07 NOTE — Progress Notes (Signed)
PATIENT ID: Kimberly [name] is a 77F with hypertension, hyperlipidemia, diabetes and ongoing tobacco abuse who presents with one week of typical angina.   SUBJECTIVE:  Kimberly [name] reports continued episodes of left-sided chest discomfort with ambulation. This is associated with shortness of breath and occasionally with diaphoresis. She denies nausea.    PHYSICAL EXAM Filed Vitals:   04/06/15 1845 04/06/15 1954 04/06/15 2122 04/07/15 0616  BP: 144/86 115/58  138/76  Pulse:  72  67  Temp:  98.2 F (36.8 C)  98.1 F (36.7 C)  TempSrc:  Oral  Oral  Resp:  18  18  Height:   5\' 6"  (1.676 m)   Weight:      SpO2:  100%  100%   General:  Well-appearing. No acute distress.  Neck: No JVD. No carotid bruits. Lungs: Clear to auscultation bilaterally. No crackles, rhonchi, or wheezes.   Heart: Regular rate and rhythm. No murmurs, rubs, or gallops. Normal S1/S2. Abdomen: Soft. Nontender, nondistended. Active bowel sounds. Extremities: Warm and well-perfused. No edema.   LABS: No results found for: TROPONINI Results for orders placed or performed during the hospital encounter of 04/06/15 (from the past 24 hour(s))  Basic metabolic panel     Status: Abnormal   Collection Time: 04/06/15  2:20 PM  Result Value Ref Range   Sodium 137 135 - 145 mmol/L   Potassium 4.2 3.5 - 5.1 mmol/L   Chloride 104 101 - 111 mmol/L   CO2 23 22 - 32 mmol/L   Glucose, Bld 211 (H) 65 - 99 mg/dL   BUN 13 6 - 20 mg/dL   Creatinine, Ser 0.71 0.44 - 1.00 mg/dL   Calcium 9.3 8.9 - 10.3 mg/dL   GFR calc non Af Amer >60 >60 mL/min   GFR calc Af Amer >60 >60 mL/min   Anion gap 10 5 - 15  CBC     Status: None   Collection Time: 04/06/15  2:20 PM  Result Value Ref Range   WBC 6.2 4.0 - 10.5 K/uL   RBC 4.63 3.87 - 5.11 MIL/uL   Hemoglobin 13.5 12.0 - 15.0 g/dL   HCT 39.8 36.0 - 46.0 %   MCV 86.0 78.0 - 100.0 fL   MCH 29.2 26.0 - 34.0 pg   MCHC 33.9 30.0 - 36.0 g/dL   RDW 12.9 11.5 - 15.5 %   Platelets 315 150 -  400 K/uL  I-stat troponin, ED (not at [facility], [facility])     Status: None   Collection Time: 04/06/15  2:44 PM  Result Value Ref Range   Troponin i, poc 0.03 0.00 - 0.08 ng/mL   Comment 3          Lipid panel     Status: Abnormal   Collection Time: 04/06/15  6:16 PM  Result Value Ref Range   Cholesterol 204 (H) 0 - 200 mg/dL   Triglycerides 266 (H) <150 mg/dL   HDL 48 >40 mg/dL   Total CHOL/HDL Ratio 4.3 RATIO   VLDL 53 (H) 0 - 40 mg/dL   LDL Cholesterol 103 (H) 0 - 99 mg/dL  Glucose, capillary     Status: Abnormal   Collection Time: 04/06/15  9:01 PM  Result Value Ref Range   Glucose-Capillary 224 (H) 65 - 99 mg/dL  Glucose, capillary     Status: Abnormal   Collection Time: 04/07/15  6:06 AM  Result Value Ref Range   Glucose-Capillary 224 (H) 65 - 99 mg/dL    Intake/Output  Summary (Last 24 hours) at 04/07/15 0924 Last data filed at 04/07/15 0800  Gross per 24 hour  Intake    360 ml  Output      0 ml  Net    360 ml    Telemetry: sinus rhythm. No events  ASSESSMENT AND PLAN:  Active Problems:   Chest tightness or pressure   Chest pain   Diabetes mellitus, type 2 (HCC)   Hyperlipidemia   ADHD (attention deficit hyperactivity disorder)   Tobacco abuse   Asthma   # CAD, unstable angina:  Kimberly [name] continues to have episodes of typical angina. She also reports having elevated cardiac enzymes in the setting of her blood sugars being 700 approximately 3 years ago. She has not ever undergone any cardiac stress testing. Given her typical symptoms and several cardiac risk factors, we will pursue cardiac catheterization tomorrow. She only had one set of cardiac enzymes thus far. We will repeat troponin this morning.  Continue aspirin, carvedilol and increase her simvastatin to 20 mg daily.  Continue nitropaste.   # Hypertensive heart disease: Blood pressure well-controlled. Continue carvedilol.   # Hyperlipidemia:  LDL was 103. She was on rosuvastatin 10 mg daily at home. We will  increase that to 20 today. If she has obstructive coronary disease we will increase it to 40.   # DM: Per primary team.  # Tobacco abuse: I encouraged smoking cessation.     Time spent: 25 minutes-Greater than 50% of this time was spent in counseling, explanation of diagnosis, planning of further management, and coordination of care.    Wael Maestas C. Oval Linsey, MD, [facility] 04/07/2015 9:24 AM

## 2015-04-07 NOTE — Progress Notes (Signed)
[facility] Progress Note                                            Patient Demographics:    Kimberly [name], is a 50 y.o. female, DOB - 12-26-1965, [id number]  Admit date - 04/06/2015   Admitting Physician Lily Kocher, MD  Outpatient Primary MD for the patient is No primary care provider on file.  LOS -   Outpatient Specialists:   No chief complaint on file.       Subjective:    Kimberly [name] today has, No headache, +ve mild chest pressure, No abdominal pain - No Nausea, No new weakness tingling or numbness, No Cough - SOB.     Assessment  & Plan :     1.Chest pain in a patient with multiple risk factors. EKG nonacute, first troponin negative, cycle troponin, seen by cardiology, currently having mild chest pressure, continue aspirin, statin, beta blocker, Nitropaste was secondly prevention. Currently on full dose Lovenox per cardiology, May require left heart catheterization was a stress test will defer to cardiology.  2. Ongoing smoking. Counseled to quit.  3. Essential hypertension. Added Coreg to Nitropaste will monitor.  4. Dyslipidemia. On statin continue.  5. DM type II. Currently on Lantus and sliding scale will continue to titrate.  No results found for: HGBA1C  CBG (last 3)   Recent Labs  04/06/15 2101 04/07/15 0606  GLUCAP 224* 224*      Code Status : Full  Family Communication  : None  Disposition Plan  : Discharge home in 1-2 days  Barriers For Discharge : Chest pain evaluation per cardiology  Consults  :  Cards  Procedures  :    DVT Prophylaxis  :  Lovenox    Lab Results  Component Value Date   PLT 315  04/06/2015    Antibiotics  :    Anti-infectives    None        Objective:   Filed Vitals:   04/06/15 1845 04/06/15 1954 04/06/15 2122 04/07/15 0616  BP: 144/86 115/58  138/76  Pulse:  72  67  Temp:  98.2 F (36.8 C)  98.1 F (36.7 C)  TempSrc:  Oral  Oral  Resp:  18  18  Height:   5\' 6"  (1.676 m)   Weight:      SpO2:  100%  100%    Wt Readings from Last 3 Encounters:  04/06/15 85.4 kg (188 lb 4.4 oz)     Intake/Output Summary (Last 24 hours) at 04/07/15 0939 Last data filed at 04/07/15 0800  Gross per 24 hour  Intake    360 ml  Output      0 ml  Net    360 ml     Physical Exam  Awake Alert, Oriented X 3, No new F.N deficits, Normal affect Kerr.AT,PERRAL Supple Neck,No JVD, No cervical lymphadenopathy appriciated.  Symmetrical Chest wall movement, Good air movement bilaterally, CTAB RRR,No Gallops,Rubs or new Murmurs, No Parasternal Heave +ve B.Sounds, Abd Soft, No tenderness, No organomegaly appriciated, No rebound - guarding or rigidity. No Cyanosis, Clubbing or edema, No new Rash or bruise       Data Review:    CBC  Recent Labs Lab 04/06/15 1420  WBC 6.2  HGB 13.5  HCT 39.8  PLT 315  MCV 86.0  MCH 29.2  MCHC 33.9  RDW 12.9    Chemistries   Recent Labs Lab 04/06/15 1420  NA 137  K 4.2  CL 104  CO2 23  GLUCOSE 211*  BUN 13  CREATININE 0.71  CALCIUM 9.3   ------------------------------------------------------------------------------------------------------------------  Recent Labs  04/06/15 1816  CHOL 204*  HDL 48  LDLCALC 103*  TRIG 266*  CHOLHDL 4.3    No results found for: HGBA1C ------------------------------------------------------------------------------------------------------------------ No results for input(s): TSH, T4TOTAL, T3FREE, THYROIDAB in the last 72 hours.  Invalid input(s): FREET3 ------------------------------------------------------------------------------------------------------------------ No  results for input(s): VITAMINB12, FOLATE, FERRITIN, TIBC, IRON, RETICCTPCT in the last 72 hours.  Coagulation profile No results for input(s): INR, PROTIME in the last 168 hours.  No results for input(s): DDIMER in the last 72 hours.  Cardiac Enzymes No results for input(s): CKMB, TROPONINI, MYOGLOBIN in the last 168 hours.  Invalid input(s): CK ------------------------------------------------------------------------------------------------------------------ No results found for: BNP  Inpatient Medications  Scheduled Meds: . aspirin  81 mg Oral Daily  . carvedilol  3.125 mg Oral BID WC  . insulin aspart  0-9 Units Subcutaneous TID WC  . insulin glargine  20 Units Subcutaneous QHS  . nitroGLYCERIN  0.5 inch Topical 4 times per day  . rosuvastatin  10 mg Oral Daily   Continuous Infusions:  PRN Meds:.acetaminophen, albuterol, cyclobenzaprine, HYDROcodone-acetaminophen, morphine injection, ondansetron (ZOFRAN) IV  Micro Results No results found for this or any previous visit (from the past 240 hour(s)).  Radiology Reports Dg Chest 2 View  04/06/2015  CLINICAL DATA:  Pt reports chest tightness x 3-4 days; pt reports h/o DM, asthma, and MI 2 yrs ago; smoker EXAM: CHEST  2 VIEW COMPARISON:  None. FINDINGS: The heart size and mediastinal contours are within normal limits. Both lungs are clear. No pleural effusion or pneumothorax. The visualized skeletal structures are unremarkable. IMPRESSION: No active cardiopulmonary disease. Electronically Signed   By: Lajean Manes M.D.   On: 04/06/2015 15:03    Time Spent in minutes 35   SINGH,PRASHANT K M.D on 04/07/2015 at 9:39 AM  Between 7am to 7pm - Pager - [phone]  After 7pm go to [email or web address] - password [facility]  [facility] -  Office  [phone]

## 2015-04-08 ENCOUNTER — Other Ambulatory Visit: Payer: Self-pay | Admitting: *Deleted

## 2015-04-08 ENCOUNTER — Inpatient Hospital Stay (HOSPITAL_COMMUNITY): Payer: Self-pay

## 2015-04-08 ENCOUNTER — Encounter (HOSPITAL_COMMUNITY)
Admission: EM | Disposition: A | Discharge status: Home Health | Payer: Self-pay | Source: Home / Self Care | Attending: Cardiothoracic Surgery

## 2015-04-08 DIAGNOSIS — I2510 Atherosclerotic heart disease of native coronary artery without angina pectoris: Secondary | ICD-10-CM

## 2015-04-08 DIAGNOSIS — I25110 Atherosclerotic heart disease of native coronary artery with unstable angina pectoris: Secondary | ICD-10-CM

## 2015-04-08 DIAGNOSIS — R0789 Other chest pain: Secondary | ICD-10-CM

## 2015-04-08 DIAGNOSIS — I200 Unstable angina: Secondary | ICD-10-CM | POA: Insufficient documentation

## 2015-04-08 HISTORY — PX: CARDIAC CATHETERIZATION: SHX172

## 2015-04-08 LAB — URINALYSIS, ROUTINE W REFLEX MICROSCOPIC
Bilirubin Urine: NEGATIVE
Glucose, UA: NEGATIVE mg/dL
Hgb urine dipstick: NEGATIVE
Ketones, ur: NEGATIVE mg/dL
Leukocytes, UA: NEGATIVE
Nitrite: NEGATIVE
Protein, ur: NEGATIVE mg/dL
Specific Gravity, Urine: 1.035 — ABNORMAL HIGH (ref 1.005–1.030)
pH: 6 (ref 5.0–8.0)

## 2015-04-08 LAB — CBC
HEMATOCRIT: 38 % (ref 36.0–46.0)
Hemoglobin: 13 g/dL (ref 12.0–15.0)
MCH: 28.9 pg (ref 26.0–34.0)
MCHC: 34.2 g/dL (ref 30.0–36.0)
MCV: 84.4 fL (ref 78.0–100.0)
PLATELETS: 268 10*3/uL (ref 150–400)
RBC: 4.5 MIL/uL (ref 3.87–5.11)
RDW: 12.6 % (ref 11.5–15.5)
WBC: 5.9 10*3/uL (ref 4.0–10.5)

## 2015-04-08 LAB — GLUCOSE, CAPILLARY
GLUCOSE-CAPILLARY: 99 mg/dL (ref 65–99)
GLUCOSE-CAPILLARY: 170 mg/dL — AB (ref 65–99)
Glucose-Capillary: 134 mg/dL — ABNORMAL HIGH (ref 65–99)
Glucose-Capillary: 114 mg/dL — ABNORMAL HIGH (ref 65–99)
Glucose-Capillary: 86 mg/dL (ref 65–99)
Glucose-Capillary: 139 mg/dL — ABNORMAL HIGH (ref 65–99)

## 2015-04-08 LAB — ECHOCARDIOGRAM COMPLETE
HEIGHTINCHES: 66 in
Weight: 2988.8 oz

## 2015-04-08 LAB — HEMOGLOBIN A1C
HEMOGLOBIN A1C: 7.9 % — AB (ref 4.8–5.6)
HEMOGLOBIN A1C: 8 % — AB (ref 4.8–5.6)
MEAN PLASMA GLUCOSE: 180 mg/dL
MEAN PLASMA GLUCOSE: 183 mg/dL

## 2015-04-08 LAB — MRSA PCR SCREENING: MRSA BY PCR: NEGATIVE

## 2015-04-08 LAB — SURGICAL PCR SCREEN
MRSA, PCR: NEGATIVE
Staphylococcus aureus: NEGATIVE

## 2015-04-08 LAB — HEPARIN LEVEL (UNFRACTIONATED): Heparin Unfractionated: 0.55 IU/mL (ref 0.30–0.70)

## 2015-04-08 SURGERY — LEFT HEART CATH AND CORONARY ANGIOGRAPHY

## 2015-04-08 MED ORDER — HEPARIN (PORCINE) IN NACL 2-0.9 UNIT/ML-% IJ SOLN
INTRAMUSCULAR | Status: DC | PRN
  Administered 2015-04-08: 1000 mL

## 2015-04-08 MED ORDER — ALPRAZOLAM 0.5 MG PO TABS
1.0000 mg | ORAL_TABLET | Freq: Two times a day (BID) | ORAL | Status: DC
  Administered 2015-04-08 – 2015-04-15 (×11): 1 mg via ORAL
  Filled 2015-04-08 (×11): qty 2

## 2015-04-08 MED ORDER — FENTANYL CITRATE (PF) 100 MCG/2ML IJ SOLN
INTRAMUSCULAR | Status: AC
  Filled 2015-04-08: qty 2

## 2015-04-08 MED ORDER — FLUTICASONE FUROATE-VILANTEROL 200-25 MCG/INH IN AEPB
1.0000 | INHALATION_SPRAY | Freq: Every day | RESPIRATORY_TRACT | Status: DC
  Administered 2015-04-09 – 2015-04-15 (×4): 1 via RESPIRATORY_TRACT
  Filled 2015-04-08 (×2): qty 28

## 2015-04-08 MED ORDER — ASPIRIN 81 MG PO CHEW: 81.0000 mg | CHEWABLE_TABLET | Freq: Every day | ORAL | Status: DC

## 2015-04-08 MED ORDER — IOPAMIDOL (ISOVUE-370) INJECTION 76%
INTRAVENOUS | Status: DC | PRN
  Administered 2015-04-08: 75 mL via INTRAVENOUS

## 2015-04-08 MED ORDER — TRAZODONE HCL 100 MG PO TABS: 100.0000 mg | ORAL_TABLET | Freq: Every evening | ORAL | Status: DC | PRN

## 2015-04-08 MED ORDER — ATORVASTATIN CALCIUM 80 MG PO TABS
80.0000 mg | ORAL_TABLET | Freq: Every day | ORAL | Status: DC
  Administered 2015-04-08 – 2015-04-14 (×6): 80 mg via ORAL
  Filled 2015-04-08 (×6): qty 1

## 2015-04-08 MED ORDER — LIDOCAINE HCL (PF) 1 % IJ SOLN
INTRAMUSCULAR | Status: DC | PRN
  Administered 2015-04-08: 2 mL

## 2015-04-08 MED ORDER — HEPARIN (PORCINE) IN NACL 100-0.45 UNIT/ML-% IJ SOLN
1250.0000 [IU]/h | INTRAMUSCULAR | Status: DC
  Administered 2015-04-08 (×2): 1100 [IU]/h via INTRAVENOUS
  Administered 2015-04-10: 1250 [IU]/h via INTRAVENOUS
  Filled 2015-04-08 (×4): qty 250

## 2015-04-08 MED ORDER — SODIUM CHLORIDE 0.9% FLUSH
3.0000 mL | Freq: Two times a day (BID) | INTRAVENOUS | Status: DC
  Administered 2015-04-08 – 2015-04-09 (×2): 3 mL via INTRAVENOUS

## 2015-04-08 MED ORDER — HYDROXYZINE HCL 25 MG PO TABS: 25.0000 mg | ORAL_TABLET | Freq: Three times a day (TID) | ORAL | Status: DC | PRN

## 2015-04-08 MED ORDER — HEPARIN SODIUM (PORCINE) 1000 UNIT/ML IJ SOLN
INTRAMUSCULAR | Status: AC
  Filled 2015-04-08: qty 1

## 2015-04-08 MED ORDER — SODIUM CHLORIDE 0.9 % WEIGHT BASED INFUSION: 1.0000 mL/kg/h | INTRAVENOUS | Status: AC

## 2015-04-08 MED ORDER — HEPARIN SODIUM (PORCINE) 1000 UNIT/ML IJ SOLN
INTRAMUSCULAR | Status: DC | PRN
  Administered 2015-04-08: 4500 [IU] via INTRAVENOUS

## 2015-04-08 MED ORDER — ATORVASTATIN CALCIUM 40 MG PO TABS: 40.0000 mg | ORAL_TABLET | Freq: Every day | ORAL | Status: DC

## 2015-04-08 MED ORDER — ALPRAZOLAM 0.25 MG PO TABS
0.2500 mg | ORAL_TABLET | Freq: Two times a day (BID) | ORAL | Status: DC | PRN
  Administered 2015-04-08: 0.25 mg via ORAL
  Filled 2015-04-08: qty 1

## 2015-04-08 MED ORDER — HEPARIN BOLUS VIA INFUSION
4000.0000 [IU] | Freq: Once | INTRAVENOUS | Status: AC
  Administered 2015-04-08: 4000 [IU] via INTRAVENOUS
  Filled 2015-04-08: qty 4000

## 2015-04-08 MED ORDER — HEPARIN (PORCINE) IN NACL 2-0.9 UNIT/ML-% IJ SOLN
INTRAMUSCULAR | Status: AC
  Filled 2015-04-08: qty 1500

## 2015-04-08 MED ORDER — ALPRAZOLAM 0.5 MG PO TABS: 1.0000 mg | ORAL_TABLET | Freq: Three times a day (TID) | ORAL | Status: DC | PRN

## 2015-04-08 MED ORDER — LIDOCAINE HCL (PF) 1 % IJ SOLN
INTRAMUSCULAR | Status: AC
  Filled 2015-04-08: qty 30

## 2015-04-08 MED ORDER — ACETAMINOPHEN 325 MG PO TABS
650.0000 mg | ORAL_TABLET | ORAL | Status: DC | PRN
  Administered 2015-04-09 – 2015-04-10 (×2): 650 mg via ORAL
  Filled 2015-04-08 (×2): qty 2

## 2015-04-08 MED ORDER — SODIUM CHLORIDE 0.9% FLUSH: 3.0000 mL | INTRAVENOUS | Status: DC | PRN

## 2015-04-08 MED ORDER — IOPAMIDOL (ISOVUE-370) INJECTION 76%
INTRAVENOUS | Status: AC
  Filled 2015-04-08: qty 50

## 2015-04-08 MED ORDER — NICOTINE 14 MG/24HR TD PT24
14.0000 mg | MEDICATED_PATCH | Freq: Every day | TRANSDERMAL | Status: DC
  Administered 2015-04-08 – 2015-04-10 (×3): 14 mg via TRANSDERMAL
  Filled 2015-04-08 (×3): qty 1

## 2015-04-08 MED ORDER — IOPAMIDOL (ISOVUE-370) INJECTION 76%
INTRAVENOUS | Status: AC
  Filled 2015-04-08: qty 100

## 2015-04-08 MED ORDER — FENTANYL CITRATE (PF) 100 MCG/2ML IJ SOLN
INTRAMUSCULAR | Status: DC | PRN
  Administered 2015-04-08: 25 ug via INTRAVENOUS
  Administered 2015-04-08: 50 ug via INTRAVENOUS

## 2015-04-08 MED ORDER — VERAPAMIL HCL 2.5 MG/ML IV SOLN
INTRAVENOUS | Status: DC | PRN
  Administered 2015-04-08: 10 mL via INTRA_ARTERIAL

## 2015-04-08 MED ORDER — HEPARIN (PORCINE) IN NACL 100-0.45 UNIT/ML-% IJ SOLN
1100.0000 [IU]/h | INTRAMUSCULAR | Status: DC
  Administered 2015-04-08: 1100 [IU]/h via INTRAVENOUS
  Filled 2015-04-08: qty 250

## 2015-04-08 MED ORDER — MIDAZOLAM HCL 2 MG/2ML IJ SOLN
INTRAMUSCULAR | Status: AC
  Filled 2015-04-08: qty 2

## 2015-04-08 MED ORDER — VERAPAMIL HCL 2.5 MG/ML IV SOLN
INTRAVENOUS | Status: AC
  Filled 2015-04-08: qty 2

## 2015-04-08 MED ORDER — SODIUM CHLORIDE 0.9 % IV SOLN: 250.0000 mL | INTRAVENOUS | Status: DC | PRN

## 2015-04-08 MED ORDER — ASPIRIN EC 81 MG PO TBEC: 81.0000 mg | DELAYED_RELEASE_TABLET | Freq: Every day | ORAL | Status: DC

## 2015-04-08 MED ORDER — MIDAZOLAM HCL 2 MG/2ML IJ SOLN
INTRAMUSCULAR | Status: DC | PRN
  Administered 2015-04-08: 1 mg via INTRAVENOUS
  Administered 2015-04-08: 2 mg via INTRAVENOUS

## 2015-04-08 MED ORDER — ONDANSETRON HCL 4 MG/2ML IJ SOLN: 4.0000 mg | Freq: Four times a day (QID) | INTRAMUSCULAR | Status: DC | PRN

## 2015-04-08 SURGICAL SUPPLY — 12 items
CATH INFINITI 5 FR JL3.5 (CATHETERS) ×2 IMPLANT
CATH INFINITI 5FR ANG PIGTAIL (CATHETERS) ×2 IMPLANT
CATH INFINITI JR4 5F (CATHETERS) ×2 IMPLANT
CATH LAUNCHER 5F EBU3.0 (CATHETERS) ×1 IMPLANT
CATHETER LAUNCHER 5F EBU3.0 (CATHETERS) ×2
DEVICE RAD COMP TR BAND LRG (VASCULAR PRODUCTS) ×2 IMPLANT
GLIDESHEATH SLEND SS 6F .021 (SHEATH) ×2 IMPLANT
KIT HEART LEFT (KITS) ×2 IMPLANT
PACK CARDIAC CATHETERIZATION (CUSTOM PROCEDURE TRAY) ×2 IMPLANT
TRANSDUCER W/STOPCOCK (MISCELLANEOUS) ×2 IMPLANT
TUBING CIL FLEX 10 FLL-RA (TUBING) ×2 IMPLANT
WIRE J 3MM .035X260CM (WIRE) ×2 IMPLANT

## 2015-04-08 NOTE — Progress Notes (Signed)
ANTICOAGULATION CONSULT NOTE - Follow Up Consult  Pharmacy Consult for heparin Indication: chest pain/ACS  Allergies  Allergen Reactions  . Pyridium [Phenazopyridine Hcl] Itching    Patient Measurements: Height: 5\' 6"  (167.6 cm) Weight: 186 lb 12.8 oz (84.732 kg) IBW/kg (Calculated) : 59.3 Heparin Dosing Weight: 80kg  Vital Signs: Temp: 98.6 F (37 C) (03/27 0510) Temp Source: Oral (03/27 0510) BP: 149/65 mmHg (03/27 0510) Pulse Rate: 73 (03/27 0510)  Labs:  Recent Labs  04/06/15 1420 04/07/15 1042 04/07/15 1503 04/07/15 2135 04/08/15 0708  HGB 13.5  --   --   --  13.0  HCT 39.8  --   --   --  38.0  PLT 315  --   --   --  268  LABPROT  --   --  13.5  --   --   INR  --   --  1.01  --   --   HEPARINUNFRC  --   --   --   --  0.55  CREATININE 0.71  --   --   --   --   TROPONINI  --  0.03 0.03 <0.03  --     Estimated Creatinine Clearance: 91.3 mL/min (by C-G formula based on Cr of 0.71).   Medical History: Past Medical History  Diagnosis Date  . Asthma   . Diabetes mellitus without complication ([place])   . Myocardial infarction [facility])     Medications:  Prescriptions prior to admission  Medication Sig Dispense Refill Last Dose  . albuterol (PROVENTIL HFA;VENTOLIN HFA) 108 (90 Base) MCG/ACT inhaler Inhale 1 puff into the lungs every 6 (six) hours as needed for wheezing or shortness of breath.   04/06/2015 at am  . albuterol (PROVENTIL) (2.5 MG/3ML) 0.083% nebulizer solution Take 2.5 mg by nebulization every 6 (six) hours as needed for wheezing or shortness of breath.   04/05/2015 at Unknown time  . amphetamine-dextroamphetamine (ADDERALL) 20 MG tablet Take 20 mg by mouth 2 (two) times daily.   couple days ago  . cyclobenzaprine (FLEXERIL) 10 MG tablet Take 10 mg by mouth 3 (three) times daily as needed for muscle spasms.   couple days ago  . HYDROXYZINE HCL PO Take 1 tablet by mouth.   few days ago  . insulin glargine (LANTUS) 100 unit/mL SOPN Inject 30 Units into the  skin daily after breakfast.   04/05/2015 at Unknown time  . Insulin Glulisine (APIDRA SOLOSTAR) 100 UNIT/ML Solostar Pen Inject 22 Units into the skin 2 (two) times daily after a meal.   04/05/2015 at Unknown time  . Menthol-Methyl Salicylate (MUSCLE RUB EX) Apply 1 application topically 2 (two) times daily as needed (pain).   couple days ago  . Rosuvastatin Calcium (CRESTOR PO) Take 1 tablet by mouth daily.   few days ago   Scheduled:  . aspirin  81 mg Oral Daily  . carvedilol  3.125 mg Oral BID WC  . insulin aspart  0-5 Units Subcutaneous QHS  . insulin aspart  0-9 Units Subcutaneous TID WC  . insulin aspart  5 Units Subcutaneous TID WC  . insulin glargine  20 Units Subcutaneous QHS  . nitroGLYCERIN  0.5 inch Topical 4 times per day  . rosuvastatin  20 mg Oral Daily  . sodium chloride flush  3 mL Intravenous Q12H   Infusions:  . sodium chloride 1 mL/kg/hr (04/08/15 0646)  . heparin 1,100 Units/hr (04/08/15 0038)    Assessment: 50yo female c/o chest discomfort x1wk, awaiting LHC  planned for noon. On IV heparin at 1100 units/hr. Initial HL therapeutic at 0.55. CBC wnl.    Goal of Therapy:  Heparin level 0.3-0.7 units/ml Monitor platelets by anticoagulation protocol: Yes   Plan:  -Continue IV heparin at 1100 units/hr -F/u after cath   Albertina Parr, PharmD., BCPS Clinical Pharmacist Pager [phone]

## 2015-04-08 NOTE — Progress Notes (Signed)
Day of Surgery Procedure(s) (LRB): Left Heart Cath and Coronary Angiography (N/A) Subjective: Unstable angina 90% ostial LAD, good LV fx Plan CABG 3-30  0600  Objective: Vital signs in last 24 hours: Temp:  [97.7 F (36.5 C)-98.6 F (37 C)] 97.7 F (36.5 C) (03/27 1744) Pulse Rate:  [52-81] 71 (03/27 1800) Cardiac Rhythm:  [-] Normal sinus rhythm (03/27 1601) Resp:  [10-35] 10 (03/27 1800) BP: (129-167)/(65-97) 147/79 mmHg (03/27 1800) SpO2:  [0 %-100 %] 98 % (03/27 1800) Weight:  [186 lb 12.8 oz (84.732 kg)] 186 lb 12.8 oz (84.732 kg) (03/27 0510)  Hemodynamic parameters for last 24 hours:  nsr  Intake/Output from previous day: 03/26 0701 - 03/27 0700 In: 840 [P.O.:840] Out: -  Intake/Output this shift:    No hematoma R wrist  Lab Results:  Recent Labs  04/06/15 1420 04/08/15 0708  WBC 6.2 5.9  HGB 13.5 13.0  HCT 39.8 38.0  PLT 315 268   BMET:  Recent Labs  04/06/15 1420  NA 137  K 4.2  CL 104  CO2 23  GLUCOSE 211*  BUN 13  CREATININE 0.71  CALCIUM 9.3    PT/INR:  Recent Labs  04/07/15 1503  LABPROT 13.5  INR 1.01   ABG No results found for: PHART, HCO3, TCO2, ACIDBASEDEF, O2SAT CBG (last 3)   Recent Labs  04/08/15 1329 04/08/15 1512 04/08/15 1747  GLUCAP 114* 86 170*    Assessment/Plan: S/P Procedure(s) (LRB): Left Heart Cath and Coronary Angiography (N/A) CABG this thurs am Full consult to follow   LOS: 0 days    Kimberly [name] 04/08/2015

## 2015-04-08 NOTE — H&P (View-Only) (Signed)
Patient Name: Kimberly [name] Date of Encounter: 04/08/2015  Active Problems:   Chest tightness or pressure   Chest pain   Diabetes mellitus, type 2 ([place])   Hyperlipidemia   ADHD (attention deficit hyperactivity disorder)   Tobacco abuse   Asthma   Hypertensive heart disease without heart failure   Primary Cardiologist: New  Patient Profile: Ms. Kimberly [name] is a 73F with hypertension, hyperlipidemia, diabetes and ongoing tobacco abuse who presents with one week of typical angina.  Her troponin has been negative x 3, but she continues to have pain associated with SOB and occasionally diaphoresis. She has a left heart cath scheduled for today.   SUBJECTIVE: She is feeling well, she endorses chest pain with walking. No chest pain at rest.  Very anxious about upcoming heart cath.   OBJECTIVE Filed Vitals:   04/07/15 0616 04/07/15 1356 04/07/15 2039 04/08/15 0510  BP: 138/76 143/76 139/73 149/65  Pulse: 67 59 72 73  Temp: 98.1 F (36.7 C) 98 F (36.7 C) 98.4 F (36.9 C) 98.6 F (37 C)  TempSrc: Oral Oral Oral Oral  Resp: 18 20 18    Height:      Weight:    186 lb 12.8 oz (84.732 kg)  SpO2: 100% 99% 97% 100%    Intake/Output Summary (Last 24 hours) at 04/08/15 0738 Last data filed at 04/07/15 2300  Gross per 24 hour  Intake    840 ml  Output      0 ml  Net    840 ml   Filed Weights   04/06/15 1807 04/08/15 0510  Weight: 188 lb 4.4 oz (85.4 kg) 186 lb 12.8 oz (84.732 kg)    PHYSICAL EXAM General: Well developed, well nourished, female in no acute distress. Head: Normocephalic, atraumatic.  Neck: Supple without bruits, No JVD. Lungs:  Resp regular and unlabored, CTA. Heart: RRR, S1, S2, no S3, S4, or murmur; no rub. Abdomen: Soft, non-tender, non-distended, BS + x 4.  Extremities: No clubbing, cyanosis, No edema.  Neuro: Alert and oriented X 3. Moves all extremities spontaneously. Psych: Normal affect.  LABS: CBC: Recent Labs  04/06/15 1420 04/08/15 0708    WBC 6.2 5.9  HGB 13.5 13.0  HCT 39.8 38.0  MCV 86.0 84.4  PLT 315 268   INR: Recent Labs  04/07/15 1503  INR [id number]   Basic Metabolic Panel: Recent Labs  04/06/15 1420  NA 137  K 4.2  CL 104  CO2 23  GLUCOSE 211*  BUN 13  CREATININE 0.71  CALCIUM 9.3   Cardiac Enzymes: Recent Labs  04/07/15 1042 04/07/15 1503 04/07/15 2135  TROPONINI 0.03 0.03 <0.03    Recent Labs  04/06/15 1444  TROPIPOC 0.03   Fasting Lipid Panel: Recent Labs  04/06/15 1816  CHOL 204*  HDL 48  LDLCALC 103*  TRIG 266*  CHOLHDL 4.3    TELE:  NSR       ECG:  NSR  Radiology/Studies: Dg Chest 2 View  04/06/2015  CLINICAL DATA:  Pt reports chest tightness x 3-4 days; pt reports h/o DM, asthma, and MI 2 yrs ago; smoker EXAM: CHEST  2 VIEW COMPARISON:  None. FINDINGS: The heart size and mediastinal contours are within normal limits. Both lungs are clear. No pleural effusion or pneumothorax. The visualized skeletal structures are unremarkable. IMPRESSION: No active cardiopulmonary disease. Electronically Signed   By: Lajean Manes M.D.   On: 04/06/2015 15:03     Current Medications:  . aspirin  81  mg Oral Daily  . carvedilol  3.125 mg Oral BID WC  . insulin aspart  0-5 Units Subcutaneous QHS  . insulin aspart  0-9 Units Subcutaneous TID WC  . insulin aspart  5 Units Subcutaneous TID WC  . insulin glargine  20 Units Subcutaneous QHS  . nitroGLYCERIN  0.5 inch Topical 4 times per day  . rosuvastatin  20 mg Oral Daily  . sodium chloride flush  3 mL Intravenous Q12H   . sodium chloride 1 mL/kg/hr (04/08/15 0646)  . heparin 1,100 Units/hr (04/08/15 0038)    ASSESSMENT AND PLAN: Active Problems:   Chest tightness or pressure   Chest pain   Diabetes mellitus, type 2 ([facility])   Hyperlipidemia   ADHD (attention deficit hyperactivity disorder)   Tobacco abuse   Asthma   Hypertensive heart disease without heart failure  1. Unstable Angina - Kimberly [name] has several cardiac risk  factors, and continues to have unstable angina. She is for cath today.  She is on a beta blocker, ASA, and moderate dose statin.    2. Hypertensive heart disease-  BP well controlled with beta blocker.   3. Hyperlipidemia - LDL was 103. She was on rosuvastatin 10 mg daily at home, her dose has been increased to 20mg .  If she has obstructive coronary disease we will increase it to 40.   4. DM - Per primary team.  A1C pending.   5. Tobacco abuse- Cessation encouraged.    6. Anxiety - Patient is anxious about procedure and reports serious anxiety issues.  Will give Alprazolam 0.25mg  for anxiety pre cath.    Signed, Arbutus Leas , NP 7:38 AM 04/08/2015 Pager [phone]  Patient seen and examined. Agree with assessment and plan. No recurrent chest pain. Symptom complex worrisome for ischemic chest pain with exertional precipitation of vice like chest pressure.  I have reviewed the risks, indications, and alternatives to cardiac catheterization, possible angioplasty, and stenting with the patient. Risks include but are not limited to bleeding, infection, vascular injury, stroke, myocardial infection, arrhythmia, kidney injury, radiation-related injury in the case of prolonged fluoroscopy use, emergency cardiac surgery, and death. The patient understands the risks of serious complication is 1-2 in 123XX123 with diagnostic cardiac cath and 1-2% or less with angioplasty/stenting.  Pt agrees to undergo procedure today. I discussed smoking cessation.    Troy Sine, MD, [facility] 04/08/2015 9:28 AM

## 2015-04-08 NOTE — Progress Notes (Signed)
ANTICOAGULATION CONSULT NOTE - Follow Up Consult  Pharmacy Consult for heparin Indication: chest pain/ACS  Allergies  Allergen Reactions  . Pyridium [Phenazopyridine Hcl] Itching    Patient Measurements: Height: 5\' 6"  (167.6 cm) Weight: 186 lb 12.8 oz (84.732 kg) IBW/kg (Calculated) : 59.3 Heparin Dosing Weight: 80kg  Vital Signs: Temp: 98 F (36.7 C) (03/27 1318) Temp Source: Oral (03/27 1318) BP: 164/86 mmHg (03/27 1530) Pulse Rate: 65 (03/27 1530)  Labs:  Recent Labs  04/06/15 1420 04/07/15 1042 04/07/15 1503 04/07/15 2135 04/08/15 0708  HGB 13.5  --   --   --  13.0  HCT 39.8  --   --   --  38.0  PLT 315  --   --   --  268  LABPROT  --   --  13.5  --   --   INR  --   --  1.01  --   --   HEPARINUNFRC  --   --   --   --  0.55  CREATININE 0.71  --   --   --   --   TROPONINI  --  0.03 0.03 <0.03  --     Estimated Creatinine Clearance: 91.3 mL/min (by C-G formula based on Cr of 0.71).   Medical History: Past Medical History  Diagnosis Date  . Asthma   . Diabetes mellitus without complication ([place])   . Myocardial infarction [facility])     Medications:  Prescriptions prior to admission  Medication Sig Dispense Refill Last Dose  . albuterol (PROVENTIL HFA;VENTOLIN HFA) 108 (90 Base) MCG/ACT inhaler Inhale 1 puff into the lungs every 6 (six) hours as needed for wheezing or shortness of breath.   04/06/2015 at am  . albuterol (PROVENTIL) (2.5 MG/3ML) 0.083% nebulizer solution Take 2.5 mg by nebulization every 6 (six) hours as needed for wheezing or shortness of breath.   04/05/2015 at Unknown time  . amphetamine-dextroamphetamine (ADDERALL) 20 MG tablet Take 20 mg by mouth 2 (two) times daily.   couple days ago  . aspirin EC 81 MG tablet Take 81 mg by mouth daily.   several weeks ago  . cyclobenzaprine (FLEXERIL) 10 MG tablet Take 10 mg by mouth 3 (three) times daily as needed for muscle spasms.   couple days ago  . hydrOXYzine (ATARAX/VISTARIL) 25 MG tablet Take 25 mg  by mouth 3 (three) times daily as needed for anxiety.   few days ago  . insulin glargine (LANTUS) 100 unit/mL SOPN Inject 30 Units into the skin daily after breakfast.   04/05/2015 at Unknown time  . Insulin Glulisine (APIDRA SOLOSTAR) 100 UNIT/ML Solostar Pen Inject 22 Units into the skin 2 (two) times daily after a meal.   04/05/2015 at Unknown time  . lovastatin (MEVACOR) 20 MG tablet Take 20 mg by mouth daily at 12 noon.   2 weeks ago  . Menthol-Methyl Salicylate (MUSCLE RUB EX) Apply 1 application topically 2 (two) times daily as needed (pain).   couple days ago  . traZODone (DESYREL) 100 MG tablet Take 100 mg by mouth at bedtime as needed for sleep.   couple weeks ago   Scheduled:  . aspirin  81 mg Oral Daily  . atorvastatin  80 mg Oral q1800  . carvedilol  3.125 mg Oral BID WC  . insulin aspart  0-5 Units Subcutaneous QHS  . insulin aspart  0-9 Units Subcutaneous TID WC  . insulin aspart  5 Units Subcutaneous TID WC  . insulin glargine  20 Units Subcutaneous QHS  . nitroGLYCERIN  0.5 inch Topical 4 times per day   Infusions:  . sodium chloride 1 mL/kg/hr (04/08/15 1534)  . heparin      Assessment: 49yo female c/o chest discomfort x1wk,  S/p LHC - with 1 vessel disease but unable to stent due to location - needs single vessel CABG.  Was previously therapeutic at 1100 units/hr  Sheath pulled at 1452 - resume in 6 hrs  Goal of Therapy:  Heparin level 0.3-0.7 units/ml Monitor platelets by anticoagulation protocol: Yes   Plan:  -Resume IV heparin at 1100 units/hr @ 2100 -Initial HL 0300 -Daily HL, CBC -Monitor for s/sx of bleeding  Levester Fresh, PharmD, BCPS, [facility] Clinical Pharmacist Pager [phone] 04/08/2015 3:54 PM

## 2015-04-08 NOTE — Progress Notes (Signed)
ANTICOAGULATION CONSULT NOTE - Initial Consult  Pharmacy Consult for heparin Indication: chest pain/ACS  Allergies  Allergen Reactions  . Pyridium [Phenazopyridine Hcl] Itching    Patient Measurements: Height: 5\' 6"  (167.6 cm) Weight: 188 lb 4.4 oz (85.4 kg) IBW/kg (Calculated) : 59.3 Heparin Dosing Weight: 80kg  Vital Signs: Temp: 98.4 F (36.9 C) (03/26 2039) Temp Source: Oral (03/26 2039) BP: 139/73 mmHg (03/26 2039) Pulse Rate: 72 (03/26 2039)  Labs:  Recent Labs  04/06/15 1420 04/07/15 1042 04/07/15 1503 04/07/15 2135  HGB 13.5  --   --   --   HCT 39.8  --   --   --   PLT 315  --   --   --   LABPROT  --   --  13.5  --   INR  --   --  1.01  --   CREATININE 0.71  --   --   --   TROPONINI  --  0.03 0.03 <0.03    Estimated Creatinine Clearance: 91.5 mL/min (by C-G formula based on Cr of 0.71).   Medical History: Past Medical History  Diagnosis Date  . Asthma   . Diabetes mellitus without complication ([place])   . Myocardial infarction [facility])     Medications:  Prescriptions prior to admission  Medication Sig Dispense Refill Last Dose  . albuterol (PROVENTIL HFA;VENTOLIN HFA) 108 (90 Base) MCG/ACT inhaler Inhale 1 puff into the lungs every 6 (six) hours as needed for wheezing or shortness of breath.   04/06/2015 at am  . albuterol (PROVENTIL) (2.5 MG/3ML) 0.083% nebulizer solution Take 2.5 mg by nebulization every 6 (six) hours as needed for wheezing or shortness of breath.   04/05/2015 at Unknown time  . amphetamine-dextroamphetamine (ADDERALL) 20 MG tablet Take 20 mg by mouth 2 (two) times daily.   couple days ago  . cyclobenzaprine (FLEXERIL) 10 MG tablet Take 10 mg by mouth 3 (three) times daily as needed for muscle spasms.   couple days ago  . HYDROXYZINE HCL PO Take 1 tablet by mouth.   few days ago  . insulin glargine (LANTUS) 100 unit/mL SOPN Inject 30 Units into the skin daily after breakfast.   04/05/2015 at Unknown time  . Insulin Glulisine (APIDRA  SOLOSTAR) 100 UNIT/ML Solostar Pen Inject 22 Units into the skin 2 (two) times daily after a meal.   04/05/2015 at Unknown time  . Menthol-Methyl Salicylate (MUSCLE RUB EX) Apply 1 application topically 2 (two) times daily as needed (pain).   couple days ago  . Rosuvastatin Calcium (CRESTOR PO) Take 1 tablet by mouth daily.   few days ago   Scheduled:  . aspirin  81 mg Oral Daily  . carvedilol  3.125 mg Oral BID WC  . insulin aspart  0-5 Units Subcutaneous QHS  . insulin aspart  0-9 Units Subcutaneous TID WC  . insulin aspart  5 Units Subcutaneous TID WC  . insulin glargine  20 Units Subcutaneous QHS  . nitroGLYCERIN  0.5 inch Topical 4 times per day  . rosuvastatin  20 mg Oral Daily  . sodium chloride flush  3 mL Intravenous Q12H   Infusions:  . sodium chloride     Followed by  . sodium chloride      Assessment: 50yo female c/o chest discomfort x1wk, awaiting LHC, to begin heparin.  Goal of Therapy:  Heparin level 0.3-0.7 units/ml Monitor platelets by anticoagulation protocol: Yes   Plan:  Will give heparin 4000 units IV bolus x1 followed by  gtt at 1100 units/hr and monitor heparin levels and CBC.  Wynona Neat, PharmD, BCPS  04/08/2015,12:18 AM

## 2015-04-08 NOTE — Progress Notes (Addendum)
Patient Name: Kimberly [name] Date of Encounter: 04/08/2015  Active Problems:   Chest tightness or pressure   Chest pain   Diabetes mellitus, type 2 ([place])   Hyperlipidemia   ADHD (attention deficit hyperactivity disorder)   Tobacco abuse   Asthma   Hypertensive heart disease without heart failure   Primary Cardiologist: New  Patient Profile: Kimberly [name] is a 71F with hypertension, hyperlipidemia, diabetes and ongoing tobacco abuse who presents with one week of typical angina.  Her troponin has been negative x 3, but she continues to have pain associated with SOB and occasionally diaphoresis. She has a left heart cath scheduled for today.   SUBJECTIVE: She is feeling well, she endorses chest pain with walking. No chest pain at rest.  Very anxious about upcoming heart cath.   OBJECTIVE Filed Vitals:   04/07/15 0616 04/07/15 1356 04/07/15 2039 04/08/15 0510  BP: 138/76 143/76 139/73 149/65  Pulse: 67 59 72 73  Temp: 98.1 F (36.7 C) 98 F (36.7 C) 98.4 F (36.9 C) 98.6 F (37 C)  TempSrc: Oral Oral Oral Oral  Resp: 18 20 18    Height:      Weight:    186 lb 12.8 oz (84.732 kg)  SpO2: 100% 99% 97% 100%    Intake/Output Summary (Last 24 hours) at 04/08/15 0738 Last data filed at 04/07/15 2300  Gross per 24 hour  Intake    840 ml  Output      0 ml  Net    840 ml   Filed Weights   04/06/15 1807 04/08/15 0510  Weight: 188 lb 4.4 oz (85.4 kg) 186 lb 12.8 oz (84.732 kg)    PHYSICAL EXAM General: Well developed, well nourished, female in no acute distress. Head: Normocephalic, atraumatic.  Neck: Supple without bruits, No JVD. Lungs:  Resp regular and unlabored, CTA. Heart: RRR, S1, S2, no S3, S4, or murmur; no rub. Abdomen: Soft, non-tender, non-distended, BS + x 4.  Extremities: No clubbing, cyanosis, No edema.  Neuro: Alert and oriented X 3. Moves all extremities spontaneously. Psych: Normal affect.  LABS: CBC: Recent Labs  04/06/15 1420 04/08/15 0708    WBC 6.2 5.9  HGB 13.5 13.0  HCT 39.8 38.0  MCV 86.0 84.4  PLT 315 268   INR: Recent Labs  04/07/15 1503  INR [id number]   Basic Metabolic Panel: Recent Labs  04/06/15 1420  NA 137  K 4.2  CL 104  CO2 23  GLUCOSE 211*  BUN 13  CREATININE 0.71  CALCIUM 9.3   Cardiac Enzymes: Recent Labs  04/07/15 1042 04/07/15 1503 04/07/15 2135  TROPONINI 0.03 0.03 <0.03    Recent Labs  04/06/15 1444  TROPIPOC 0.03   Fasting Lipid Panel: Recent Labs  04/06/15 1816  CHOL 204*  HDL 48  LDLCALC 103*  TRIG 266*  CHOLHDL 4.3    TELE:  NSR       ECG:  NSR  Radiology/Studies: Dg Chest 2 View  04/06/2015  CLINICAL DATA:  Pt reports chest tightness x 3-4 days; pt reports h/o DM, asthma, and MI 2 yrs ago; smoker EXAM: CHEST  2 VIEW COMPARISON:  None. FINDINGS: The heart size and mediastinal contours are within normal limits. Both lungs are clear. No pleural effusion or pneumothorax. The visualized skeletal structures are unremarkable. IMPRESSION: No active cardiopulmonary disease. Electronically Signed   By: Lajean Manes M.D.   On: 04/06/2015 15:03     Current Medications:  . aspirin  81  mg Oral Daily  . carvedilol  3.125 mg Oral BID WC  . insulin aspart  0-5 Units Subcutaneous QHS  . insulin aspart  0-9 Units Subcutaneous TID WC  . insulin aspart  5 Units Subcutaneous TID WC  . insulin glargine  20 Units Subcutaneous QHS  . nitroGLYCERIN  0.5 inch Topical 4 times per day  . rosuvastatin  20 mg Oral Daily  . sodium chloride flush  3 mL Intravenous Q12H   . sodium chloride 1 mL/kg/hr (04/08/15 0646)  . heparin 1,100 Units/hr (04/08/15 0038)    ASSESSMENT AND PLAN: Active Problems:   Chest tightness or pressure   Chest pain   Diabetes mellitus, type 2 ([facility])   Hyperlipidemia   ADHD (attention deficit hyperactivity disorder)   Tobacco abuse   Asthma   Hypertensive heart disease without heart failure  1. Unstable Angina - Kimberly [name] has several cardiac risk  factors, and continues to have unstable angina. She is for cath today.  She is on a beta blocker, ASA, and moderate dose statin.    2. Hypertensive heart disease-  BP well controlled with beta blocker.   3. Hyperlipidemia - LDL was 103. She was on rosuvastatin 10 mg daily at home, her dose has been increased to 20mg .  If she has obstructive coronary disease we will increase it to 40.   4. DM - Per primary team.  A1C pending.   5. Tobacco abuse- Cessation encouraged.    6. Anxiety - Patient is anxious about procedure and reports serious anxiety issues.  Will give Alprazolam 0.25mg  for anxiety pre cath.    Signed, Arbutus Leas , NP 7:38 AM 04/08/2015 Pager [phone]  Patient seen and examined. Agree with assessment and plan. No recurrent chest pain. Symptom complex worrisome for ischemic chest pain with exertional precipitation of vice like chest pressure.  I have reviewed the risks, indications, and alternatives to cardiac catheterization, possible angioplasty, and stenting with the patient. Risks include but are not limited to bleeding, infection, vascular injury, stroke, myocardial infection, arrhythmia, kidney injury, radiation-related injury in the case of prolonged fluoroscopy use, emergency cardiac surgery, and death. The patient understands the risks of serious complication is 1-2 in 123XX123 with diagnostic cardiac cath and 1-2% or less with angioplasty/stenting.  Pt agrees to undergo procedure today. I discussed smoking cessation.    Troy Sine, MD, [facility] 04/08/2015 9:28 AM

## 2015-04-08 NOTE — Progress Notes (Signed)
TR[facility] Progress Note                                            Patient Demographics:    Kimberly [name], is a 50 y.o. female, DOB - 07/10/1965, [id number]  Admit date - 04/06/2015   Admitting Physician Lily Kocher, MD  Outpatient Primary MD for the patient is No primary care provider on file.  LOS -   Outpatient Specialists:   No chief complaint on file.       Subjective:    Stormy Card today has, No headache, +ve mild chest pressure, No abdominal pain - No Nausea, No new weakness tingling or numbness, No Cough - SOB.     Assessment  & Plan :     1.Chest pain in a patient with multiple risk factors. EKG nonacute, -ve troponin x 3, seen by cardiology, L Heart cath with 90% LAD lesion, now on Hep gtt, continue aspirin, statin, beta blocker, Nitropaste was secondly prevention. Further Rx per Cards.  2. Ongoing smoking. Counseled to quit.  3. Essential hypertension. Added Coreg to Nitropaste will monitor.  4. Dyslipidemia. On statin dose increased as LDL > 70.  5. DM type II. Currently on Lantus and sliding scale will continue to titrate.  Lab Results  Component Value Date   HGBA1C 8.0* 04/07/2015    CBG (last 3)   Recent Labs  04/08/15 1114 04/08/15 1329 04/08/15 1512  GLUCAP 99 114* 86      Code Status : Full  Family Communication  : None  Disposition Plan  : Discharge home in 1-2 days  Barriers For Discharge : Chest pain evaluation per cardiology  Consults  :  Cards  Procedures  :    L Heart Cath   Ost LAD to Prox LAD lesion, 95% stenosed.  Severe tortuosity in the right subclavian made torquing catheters  quite difficult. Would not use the right radial in the future for access site for cardiac cath.  Severe ostial LAD stenosis. There is no landing zone for stent. Fixing this percutaneously would require leaving stents in the distal left main and putting a large ramus and circumflex at risk to be jailed. She would also likely have to be on lifelong clopidogrel. I think a better option would be to have a surgical consult for possible LIMA to LAD. She has a good target vessel in the mid to distal LAD.  We'll restart heparin in 6 hours. Transfer to stepdown unit. Obtain echocardiogram. This was discussed with Dr. Claiborne Billings.  DVT Prophylaxis  :  Lovenox    Lab Results  Component Value Date   PLT 268 04/08/2015    Antibiotics  :    Anti-infectives  None        Objective:   Filed Vitals:   04/08/15 1447 04/08/15 1452 04/08/15 1456 04/08/15 1530  BP: 134/90 162/95  164/86  Pulse: 62 64 58 65  Temp:      TempSrc:      Resp: 10 14 35 16  Height:      Weight:      SpO2: 99% 100% 0% 100%    Wt Readings from Last 3 Encounters:  04/08/15 84.732 kg (186 lb 12.8 oz)     Intake/Output Summary (Last 24 hours) at 04/08/15 1545 Last data filed at 04/08/15 0700  Gross per 24 hour  Intake    480 ml  Output      0 ml  Net    480 ml     Physical Exam  Awake Alert, Oriented X 3, No new F.N deficits, Normal affect [place].AT,PERRAL Supple Neck,No JVD, No cervical lymphadenopathy appriciated.  Symmetrical Chest wall movement, Good air movement bilaterally, CTAB RRR,No Gallops,Rubs or new Murmurs, No Parasternal Heave +ve B.Sounds, Abd Soft, No tenderness, No organomegaly appriciated, No rebound - guarding or rigidity. No Cyanosis, Clubbing or edema, No new Rash or bruise       Data Review:    CBC  Recent Labs Lab 04/06/15 1420 04/08/15 0708  WBC 6.2 5.9  HGB 13.5 13.0  HCT 39.8 38.0  PLT 315 268  MCV 86.0 84.4  MCH 29.2 28.9  MCHC 33.9 34.2  RDW 12.9 12.6    Chemistries    Recent Labs Lab 04/06/15 1420  NA 137  K 4.2  CL 104  CO2 23  GLUCOSE 211*  BUN 13  CREATININE 0.71  CALCIUM 9.3   ------------------------------------------------------------------------------------------------------------------  Recent Labs  04/06/15 1816  CHOL 204*  HDL 48  LDLCALC 103*  TRIG 266*  CHOLHDL 4.3    Lab Results  Component Value Date   HGBA1C 8.0* 04/07/2015   ------------------------------------------------------------------------------------------------------------------ No results for input(s): TSH, T4TOTAL, T3FREE, THYROIDAB in the last 72 hours.  Invalid input(s): FREET3 ------------------------------------------------------------------------------------------------------------------ No results for input(s): VITAMINB12, FOLATE, FERRITIN, TIBC, IRON, RETICCTPCT in the last 72 hours.  Coagulation profile  Recent Labs Lab 04/07/15 1503  INR 1.01    No results for input(s): DDIMER in the last 72 hours.  Cardiac Enzymes  Recent Labs Lab 04/07/15 1042 04/07/15 1503 04/07/15 2135  TROPONINI 0.03 0.03 <0.03   ------------------------------------------------------------------------------------------------------------------ No results found for: BNP  Inpatient Medications  Scheduled Meds: . [MAR Hold] aspirin  81 mg Oral Daily  . [MAR Hold] carvedilol  3.125 mg Oral BID WC  . [MAR Hold] insulin aspart  0-5 Units Subcutaneous QHS  . [MAR Hold] insulin aspart  0-9 Units Subcutaneous TID WC  . [MAR Hold] insulin aspart  5 Units Subcutaneous TID WC  . [MAR Hold] insulin glargine  20 Units Subcutaneous QHS  . [MAR Hold] nitroGLYCERIN  0.5 inch Topical 4 times per day  . sodium chloride flush  3 mL Intravenous Q12H   Continuous Infusions: . sodium chloride 1 mL/kg/hr (04/08/15 0646)  . sodium chloride 1 mL/kg/hr (04/08/15 1534)  . heparin 1,100 Units/hr (04/08/15 0038)   PRN Meds:.sodium chloride, [MAR Hold] acetaminophen, [MAR Hold]  albuterol, [MAR Hold] ALPRAZolam, [MAR Hold] cyclobenzaprine, [MAR Hold] HYDROcodone-acetaminophen, [MAR Hold]  morphine injection, [MAR Hold] ondansetron (ZOFRAN) IV, sodium chloride flush  Micro Results No results found for this or any previous visit (from the past 240 hour(s)).  Radiology Reports Dg Chest 2 View  04/06/2015  CLINICAL DATA:  Pt reports chest tightness x 3-4 days; pt reports h/o DM, asthma, and MI 2 yrs ago; smoker EXAM: CHEST  2 VIEW COMPARISON:  None. FINDINGS: The heart size and mediastinal contours are within normal limits. Both lungs are clear. No pleural effusion or pneumothorax. The visualized skeletal structures are unremarkable. IMPRESSION: No active cardiopulmonary disease. Electronically Signed   By: Lajean Manes M.D.   On: 04/06/2015 15:03    Time Spent in minutes 35   Orly Quimby K M.D on 04/08/2015 at 3:45 PM  Between 7am to 7pm - Pager - [phone]  After 7pm go to [email or web address] - password [facility]  [facility] -  Office  [phone]

## 2015-04-08 NOTE — Interval H&P Note (Signed)
Cath Lab Visit (complete for each Cath Lab visit)  Clinical Evaluation Leading to the Procedure:   ACS: Yes.    Non-ACS:    Anginal Classification: CCS IV  Anti-ischemic medical therapy: Maximal Therapy (2 or more classes of medications)  Non-Invasive Test Results: No non-invasive testing performed  Prior CABG: No previous CABG      History and Physical Interval Note:  04/08/2015 2:16 PM  Kimberly [name]  has presented today for surgery, with the diagnosis of cp  The various methods of treatment have been discussed with the patient and family. After consideration of risks, benefits and other options for treatment, the patient has consented to  Procedure(s): Left Heart Cath and Coronary Angiography (N/A) as a surgical intervention .  The patient's history has been reviewed, patient examined, no change in status, stable for surgery.  I have reviewed the patient's chart and labs.  Questions were answered to the patient's satisfaction.     Yamin Swingler S.

## 2015-04-08 NOTE — Plan of Care (Signed)
     Varshini Kieu was admitted to the Hospital on 04/06/2015 and Discharged  04/08/2015 and should be excused from work/school   for 5  days starting 04/06/2015 , may return to work/school without any restrictions.  Call Lala Lund MD, [facility]  [phone] with questions.  Thurnell Lose M.D on 04/08/2015,at 8:26 AM  [facility]   Office  [phone]

## 2015-04-08 NOTE — Progress Notes (Signed)
Echocardiogram 2D Echocardiogram has been performed.  Tresa Res 04/08/2015, 4:39 PM

## 2015-04-09 ENCOUNTER — Inpatient Hospital Stay (HOSPITAL_COMMUNITY): Payer: Self-pay

## 2015-04-09 ENCOUNTER — Encounter (HOSPITAL_COMMUNITY): Payer: Self-pay | Admitting: Interventional Cardiology

## 2015-04-09 DIAGNOSIS — I200 Unstable angina: Secondary | ICD-10-CM

## 2015-04-09 DIAGNOSIS — I2510 Atherosclerotic heart disease of native coronary artery without angina pectoris: Secondary | ICD-10-CM

## 2015-04-09 LAB — CBC
HCT: 38.7 % (ref 36.0–46.0)
Hemoglobin: 12.9 g/dL (ref 12.0–15.0)
MCH: 28.7 pg (ref 26.0–34.0)
MCHC: 33.3 g/dL (ref 30.0–36.0)
MCV: 86 fL (ref 78.0–100.0)
PLATELETS: 274 10*3/uL (ref 150–400)
RBC: 4.5 MIL/uL (ref 3.87–5.11)
RDW: 12.8 % (ref 11.5–15.5)
WBC: 6.2 10*3/uL (ref 4.0–10.5)

## 2015-04-09 LAB — PULMONARY FUNCTION TEST
FEF 25-75 Post: 0.34 L/sec
FEF 25-75 Pre: 1.85 L/sec
FEF2575-%Change-Post: -81 %
FEF2575-%Pred-Post: 13 %
FEF2575-%Pred-Pre: 73 %
FEV1-%Change-Post: -64 %
FEV1-%Pred-Post: 30 %
FEV1-%Pred-Pre: 84 %
FEV1-Post: 0.74 L
FEV1-Pre: 2.1 L
FEV1FVC-%Change-Post: -65 %
FEV1FVC-%Pred-Pre: 93 %
FEV6-%Change-Post: -15 %
FEV6-%Pred-Post: 78 %
FEV6-%Pred-Pre: 92 %
FEV6-Post: 2.35 L
FEV6-Pre: 2.78 L
FEV6FVC-%Change-Post: -17 %
FEV6FVC-%Pred-Post: 85 %
FEV6FVC-%Pred-Pre: 103 %
FVC-%Change-Post: 3 %
FVC-%Pred-Post: 92 %
FVC-%Pred-Pre: 90 %
FVC-Post: 2.87 L
FVC-Pre: 2.78 L
Post FEV1/FVC ratio: 26 %
Post FEV6/FVC ratio: 83 %
Pre FEV1/FVC ratio: 75 %
Pre FEV6/FVC Ratio: 100 %

## 2015-04-09 LAB — PROTIME-INR
INR: 1 (ref 0.00–1.49)
PROTHROMBIN TIME: 13.4 s (ref 11.6–15.2)

## 2015-04-09 LAB — GLUCOSE, CAPILLARY
GLUCOSE-CAPILLARY: 185 mg/dL — AB (ref 65–99)
GLUCOSE-CAPILLARY: 165 mg/dL — AB (ref 65–99)
Glucose-Capillary: 125 mg/dL — ABNORMAL HIGH (ref 65–99)
Glucose-Capillary: 151 mg/dL — ABNORMAL HIGH (ref 65–99)

## 2015-04-09 LAB — TSH: TSH: 0.654 u[IU]/mL (ref 0.350–4.500)

## 2015-04-09 LAB — HEPARIN LEVEL (UNFRACTIONATED)
HEPARIN UNFRACTIONATED: 0.27 [IU]/mL — AB (ref 0.30–0.70)
HEPARIN UNFRACTIONATED: 0.21 [IU]/mL — AB (ref 0.30–0.70)
HEPARIN UNFRACTIONATED: 0.47 [IU]/mL (ref 0.30–0.70)

## 2015-04-09 MED ORDER — ALBUTEROL SULFATE (2.5 MG/3ML) 0.083% IN NEBU
2.5000 mg | INHALATION_SOLUTION | Freq: Once | RESPIRATORY_TRACT | Status: AC
  Administered 2015-04-09: 2.5 mg via RESPIRATORY_TRACT

## 2015-04-09 MED ORDER — CARVEDILOL 6.25 MG PO TABS
6.2500 mg | ORAL_TABLET | Freq: Two times a day (BID) | ORAL | Status: DC
  Administered 2015-04-09 – 2015-04-10 (×3): 6.25 mg via ORAL
  Filled 2015-04-09 (×3): qty 1

## 2015-04-09 MED ORDER — NITROGLYCERIN IN D5W 200-5 MCG/ML-% IV SOLN
0.0000 ug/min | INTRAVENOUS | Status: DC
  Administered 2015-04-09 – 2015-04-10 (×2): 20 ug/min via INTRAVENOUS
  Filled 2015-04-09 (×2): qty 250

## 2015-04-09 NOTE — Progress Notes (Signed)
Subjective:  No chest pain now but had some recurrent discomfort earlier.  Objective:   Vital Signs : Filed Vitals:   04/08/15 2000 04/08/15 2328 04/09/15 0312 04/09/15 0755  BP: 135/101 120/71 149/96 145/83  Pulse: 91 89 74 67  Temp: 98.1 F (36.7 C) 97.7 F (36.5 C) 97.9 F (36.6 C) 97.7 F (36.5 C)  TempSrc: Oral Oral Oral Oral  Resp: 18 24 22 21   Height:      Weight:      SpO2: 97% 94% 100% 94%    Intake/Output from previous day:  Intake/Output Summary (Last 24 hours) at 04/09/15 0846 Last data filed at 04/09/15 0800  Gross per 24 hour  Intake 433.97 ml  Output    150 ml  Net 283.97 ml    I/O since admission: +1364  Wt Readings from Last 3 Encounters:  04/08/15 186 lb 12.8 oz (84.732 kg)    Medications: . ALPRAZolam  1 mg Oral BID  . aspirin  81 mg Oral Daily  . atorvastatin  80 mg Oral q1800  . carvedilol  3.125 mg Oral BID WC  . fluticasone furoate-vilanterol  1 puff Inhalation Daily  . insulin aspart  0-5 Units Subcutaneous QHS  . insulin aspart  0-9 Units Subcutaneous TID WC  . insulin aspart  5 Units Subcutaneous TID WC  . insulin glargine  20 Units Subcutaneous QHS  . nicotine  14 mg Transdermal Daily  . nitroGLYCERIN  0.5 inch Topical 4 times per day  . sodium chloride flush  3 mL Intravenous Q12H    . heparin 1,100 Units/hr (04/08/15 2334)    Physical Exam:   General appearance: alert, cooperative and no distress Neck: no adenopathy, no carotid bruit, no JVD, supple, symmetrical, trachea midline and thyroid not enlarged, symmetric, no tenderness/mass/nodules Lungs: slightly decreased BS at bases Heart: regular rate and rhythm; 1/6 Abdomen: soft, non-tender; bowel sounds normal; no masses,  no organomegaly Extremities: extremities normal, atraumatic, no cyanosis or edema Pulses: 2+ and symmetric Skin: Skin color, texture, turgor normal. No rashes or lesions Neurologic: Grossly normal   Rate: 63-70  Rhythm: normal sinus rhythm  ECG  (independently read by me): NSR at 64 withiout significant STT changes  Lab Results:   Recent Labs  04/06/15 1420  NA 137  K 4.2  CL 104  CO2 23  GLUCOSE 211*  BUN 13  CREATININE 0.71  CALCIUM 9.3    No flowsheet data found.   Recent Labs  04/06/15 1420 04/08/15 0708 04/09/15 0250  WBC 6.2 5.9 6.2  HGB 13.5 13.0 12.9  HCT 39.8 38.0 38.7  MCV 86.0 84.4 86.0  PLT 315 268 274     Recent Labs  04/07/15 1042 04/07/15 1503 04/07/15 2135  TROPONINI 0.03 0.03 <0.03    Lab Results  Component Value Date   TSH 0.654 04/09/2015    Recent Labs  04/07/15 1042  HGBA1C 8.0*    No results for input(s): PROT, ALBUMIN, AST, ALT, ALKPHOS, BILITOT, BILIDIR, IBILI in the last 72 hours.  Recent Labs  04/09/15 0250  INR 1.00   BNP (last 3 results) No results for input(s): BNP in the last 8760 hours.  ProBNP (last 3 results) No results for input(s): PROBNP in the last 8760 hours.   Lipid Panel     Component Value Date/Time   CHOL 204* 04/06/2015 1816   TRIG 266* 04/06/2015 1816   HDL 48 04/06/2015 1816   CHOLHDL 4.3 04/06/2015 1816   VLDL 53*  04/06/2015 1816   LDLCALC 103* 04/06/2015 1816      Imaging:  Cardiac Cath 04/08/15: Procedures    Left Heart Cath and Coronary Angiography    Conclusion     Ost LAD to Prox LAD lesion, 95% stenosed.  Severe tortuosity in the right subclavian made torquing catheters quite difficult. Would not use the right radial in the future for access site for cardiac cath.  Severe ostial LAD stenosis. There is no landing zone for stent. Fixing this percutaneously would require leaving stents in the distal left main and putting a large ramus and circumflex at risk to be jailed. She would also likely have to be on lifelong clopidogrel. I think a better option would be to have a surgical consult for possible LIMA to LAD. She has a good target vessel in the mid to distal LAD.    ------------------------------------------------------------------- ECHO Study Conclusions  - Left ventricle: Distal septal hypokinesis The cavity size was  mildly dilated. Wall thickness was normal. Systolic function was  normal. The estimated ejection fraction was in the range of 50%  to 55%. Doppler parameters are consistent with elevated  ventricular end-diastolic filling pressure. - Aortic valve: There was mild regurgitation. - Atrial septum: No defect or patent foramen ovale was identified.   Assessment/Plan:   Active Problems:   Chest tightness or pressure   Chest pain   Diabetes mellitus, type 2 (HCC)   Hyperlipidemia   ADHD (attention deficit hyperactivity disorder)   Tobacco abuse   Asthma   Hypertensive heart disease without heart failure   Unstable angina ([place])  1. Unstable Angina secondary to 95% ostial LAD stenosis: Reviewed cath data in detail with patient. Now on iv heparin. With some recurrent chest pain earlier, repeat ECG, dc topical nitrates and start iv NTG.  Will also increase carvedilol to 6.25 mg bid. No good landing site for PCI.  Pt was seen by Dr. Lawson Fiscal who tentatively plans CABG Thursday am.   2. Hypertensive heart disease- Now on BB, nitrates.  3. Hyperlipidemia - LDL was 103. She was on rosuvastatin 10 mg daily at home; now on atorvastatin at 80 mg.  4. DM -   5. Tobacco abuse- Another long discussion concerning discontinuance    Troy Sine, MD, [facility] 04/09/2015, 8:46 AM

## 2015-04-09 NOTE — Progress Notes (Signed)
ANTICOAGULATION CONSULT NOTE - Follow Up Consult  Pharmacy Consult for heparin Indication: CAD awaiting CABG   Labs:  Recent Labs  04/06/15 1420 04/07/15 1042 04/07/15 1503 04/07/15 2135 04/08/15 0708 04/09/15 0250  HGB 13.5  --   --   --  13.0  --   HCT 39.8  --   --   --  38.0  --   PLT 315  --   --   --  268  --   LABPROT  --   --  13.5  --   --  13.4  INR  --   --  1.01  --   --  1.00  HEPARINUNFRC  --   --   --   --  0.55 0.27*  CREATININE 0.71  --   --   --   --   --   TROPONINI  --  0.03 0.03 <0.03  --   --      Assessment/Plan: 50yo female slightly subtherapeutic on heparin after resumed post cath though was previously therapeutic at this rate, may need more time to accumulate.  Will continue heparin gtt at current rate for now and check another level.   Wynona Neat, PharmD, BCPS  04/09/2015,3:34 AM

## 2015-04-09 NOTE — Progress Notes (Signed)
CARDIAC REHAB PHASE I   Pt in bed, dopplers being performed at bedside. Pt reports chest discomfort with ambulation today to and from bathroom. MD to please advise if pt appropriate for ambulation in hallway pre surgery. Will follow-up tomorrow for pre-op education.  Lenna Sciara, RN, BSN 04/09/2015 3:06 PM

## 2015-04-09 NOTE — Progress Notes (Deleted)
Penis is swollen and puffy, elevated with rolled towel. Wife refused condom cath to be inserted.

## 2015-04-09 NOTE — Progress Notes (Signed)
[facility] Progress Note                                            Patient Demographics:    Kimberly [name], is a 50 y.o. female, DOB - 04-Nov-1965, [id number]  Admit date - 04/06/2015   Admitting Physician Lily Kocher, MD  Outpatient Primary MD for the patient is No primary care provider on file.  LOS -   Outpatient Specialists:   No chief complaint on file.       Subjective:    Stormy Card today has, No headache, +ve mild chest pressure, No abdominal pain - No Nausea, No new weakness tingling or numbness, No Cough - SOB.     Assessment  & Plan :     1.Chest pain in a patient with multiple risk factors. EKG nonacute, -ve troponin x 3, seen by cardiology, L Heart cath with 90% LAD lesion, now on Hep gtt, continue aspirin, statin, beta blocker, Nitropaste Her cardiology cardiothoracic surgery has been consulted, patient due for CABG on 04/11/2015.  2. Ongoing smoking. Counseled to quit.  3. Essential hypertension. Added Coreg to Nitropaste will monitor.  4. Dyslipidemia. On statin dose increased as LDL > 70.  5. DM type II. Currently on Lantus and sliding scale will continue to titrate.  Lab Results  Component Value Date   HGBA1C 8.0* 04/07/2015    CBG (last 3)   Recent Labs  04/08/15 1747 04/08/15 2139 04/09/15 0754  GLUCAP 170* 139* 125*      Code Status : Full  Family Communication  : husband  Disposition Plan  : stay inpt  Barriers For Discharge : CABG due  Consults  :  Cards, TCTS  Procedures  :    L Heart Cath   Ost LAD to Prox LAD lesion, 95% stenosed.  Severe tortuosity in the right subclavian made  torquing catheters quite difficult. Would not use the right radial in the future for access site for cardiac cath.  Severe ostial LAD stenosis. There is no landing zone for stent. Fixing this percutaneously would require leaving stents in the distal left main and putting a large ramus and circumflex at risk to be jailed. She would also likely have to be on lifelong clopidogrel. I think a better option would be to have a surgical consult for possible LIMA to LAD. She has a good target vessel in the mid to distal LAD.  We'll restart heparin in 6 hours. Transfer to stepdown unit. Obtain echocardiogram. This was discussed with Dr. Claiborne Billings.   DVT Prophylaxis  :  Heparin  Lab Results  Component Value Date   PLT 274 04/09/2015    Antibiotics  :    Anti-infectives  None        Objective:   Filed Vitals:   04/09/15 0312 04/09/15 0755 04/09/15 0928 04/09/15 1000  BP: 149/96 145/83  110/71  Pulse: 74 67    Temp: 97.9 F (36.6 C) 97.7 F (36.5 C)    TempSrc: Oral Oral    Resp: 22 21  25   Height:      Weight:      SpO2: 100% 94% 98%     Wt Readings from Last 3 Encounters:  04/08/15 84.732 kg (186 lb 12.8 oz)     Intake/Output Summary (Last 24 hours) at 04/09/15 1052 Last data filed at 04/09/15 1000  Gross per 24 hour  Intake 450.97 ml  Output    150 ml  Net 300.97 ml     Physical Exam  Awake Alert, Oriented X 3, No new F.N deficits, Normal affect [place].AT,PERRAL Supple Neck,No JVD, No cervical lymphadenopathy appriciated.  Symmetrical Chest wall movement, Good air movement bilaterally, CTAB RRR,No Gallops,Rubs or new Murmurs, No Parasternal Heave +ve B.Sounds, Abd Soft, No tenderness, No organomegaly appriciated, No rebound - guarding or rigidity. No Cyanosis, Clubbing or edema, No new Rash or bruise       Data Review:    CBC  Recent Labs Lab 04/06/15 1420 04/08/15 0708 04/09/15 0250  WBC 6.2 5.9 6.2  HGB 13.5 13.0 12.9  HCT 39.8 38.0 38.7  PLT 315 268  274  MCV 86.0 84.4 86.0  MCH 29.2 28.9 28.7  MCHC 33.9 34.2 33.3  RDW 12.9 12.6 12.8    Chemistries   Recent Labs Lab 04/06/15 1420  NA 137  K 4.2  CL 104  CO2 23  GLUCOSE 211*  BUN 13  CREATININE 0.71  CALCIUM 9.3   ------------------------------------------------------------------------------------------------------------------  Recent Labs  04/06/15 1816  CHOL 204*  HDL 48  LDLCALC 103*  TRIG 266*  CHOLHDL 4.3    Lab Results  Component Value Date   HGBA1C 8.0* 04/07/2015   ------------------------------------------------------------------------------------------------------------------  Recent Labs  04/09/15 0250  TSH 0.654   ------------------------------------------------------------------------------------------------------------------ No results for input(s): VITAMINB12, FOLATE, FERRITIN, TIBC, IRON, RETICCTPCT in the last 72 hours.  Coagulation profile  Recent Labs Lab 04/07/15 1503 04/09/15 0250  INR 1.01 1.00    No results for input(s): DDIMER in the last 72 hours.  Cardiac Enzymes  Recent Labs Lab 04/07/15 1042 04/07/15 1503 04/07/15 2135  TROPONINI 0.03 0.03 <0.03   ------------------------------------------------------------------------------------------------------------------ No results found for: BNP  Inpatient Medications  Scheduled Meds: . ALPRAZolam  1 mg Oral BID  . aspirin  81 mg Oral Daily  . atorvastatin  80 mg Oral q1800  . carvedilol  6.25 mg Oral BID WC  . fluticasone furoate-vilanterol  1 puff Inhalation Daily  . insulin aspart  0-5 Units Subcutaneous QHS  . insulin aspart  0-9 Units Subcutaneous TID WC  . insulin aspart  5 Units Subcutaneous TID WC  . insulin glargine  20 Units Subcutaneous QHS  . nicotine  14 mg Transdermal Daily  . sodium chloride flush  3 mL Intravenous Q12H   Continuous Infusions: . heparin 1,250 Units/hr (04/09/15 1036)  . nitroGLYCERIN 20 mcg/min (04/09/15 0934)   PRN  Meds:.sodium chloride, acetaminophen, albuterol, cyclobenzaprine, HYDROcodone-acetaminophen, hydrOXYzine, morphine injection, ondansetron (ZOFRAN) IV, sodium chloride flush, traZODone  Micro Results Recent Results (from the past 240 hour(s))  MRSA PCR Screening     Status: None   Collection Time: 04/08/15  4:00 PM  Result Value Ref Range Status   MRSA by PCR  NEGATIVE NEGATIVE Final    Comment:        The GeneXpert MRSA Assay (FDA approved for NASAL specimens only), is one component of a comprehensive MRSA colonization surveillance program. It is not intended to diagnose MRSA infection nor to guide or monitor treatment for MRSA infections.   Surgical pcr screen     Status: None   Collection Time: 04/08/15  7:43 PM  Result Value Ref Range Status   MRSA, PCR NEGATIVE NEGATIVE Final   Staphylococcus aureus NEGATIVE NEGATIVE Final    Comment:        The Xpert SA Assay (FDA approved for NASAL specimens in patients over 86 years of age), is one component of a comprehensive surveillance program.  Test performance has been validated by [facility] for patients greater than or equal to 28 year old. It is not intended to diagnose infection nor to guide or monitor treatment.     Radiology Reports Dg Chest 2 View  04/06/2015  CLINICAL DATA:  Pt reports chest tightness x 3-4 days; pt reports h/o DM, asthma, and MI 2 yrs ago; smoker EXAM: CHEST  2 VIEW COMPARISON:  None. FINDINGS: The heart size and mediastinal contours are within normal limits. Both lungs are clear. No pleural effusion or pneumothorax. The visualized skeletal structures are unremarkable. IMPRESSION: No active cardiopulmonary disease. Electronically Signed   By: Lajean Manes M.D.   On: 04/06/2015 15:03    Time Spent in minutes 35   Bernardine Langworthy K M.D on 04/09/2015 at 10:52 AM  Between 7am to 7pm - Pager - [phone]  After 7pm go to [email or web address] - password [facility]  [facility] -  Office   [phone]

## 2015-04-09 NOTE — Progress Notes (Signed)
ANTICOAGULATION CONSULT NOTE - Follow Up Consult  Pharmacy Consult for heparin Indication: chest pain/ACS  Allergies  Allergen Reactions  . Pyridium [Phenazopyridine Hcl] Itching    Patient Measurements: Height: 5\' 6"  (167.6 cm) Weight: 186 lb 12.8 oz (84.732 kg) IBW/kg (Calculated) : 59.3 Heparin Dosing Weight: 80kg  Vital Signs: Temp: 97.7 F (36.5 C) (03/28 0755) Temp Source: Oral (03/28 0755) BP: 110/71 mmHg (03/28 1000) Pulse Rate: 67 (03/28 0755)  Labs:  Recent Labs  04/06/15 1420 04/07/15 1042 04/07/15 1503 04/07/15 2135 04/08/15 0708 04/09/15 0250 04/09/15 0808  HGB 13.5  --   --   --  13.0 12.9  --   HCT 39.8  --   --   --  38.0 38.7  --   PLT 315  --   --   --  268 274  --   LABPROT  --   --  13.5  --   --  13.4  --   INR  --   --  1.01  --   --  1.00  --   HEPARINUNFRC  --   --   --   --  0.55 0.27* 0.21*  CREATININE 0.71  --   --   --   --   --   --   TROPONINI  --  0.03 0.03 <0.03  --   --   --     Estimated Creatinine Clearance: 91.3 mL/min (by C-G formula based on Cr of 0.71).   Medical History: Past Medical History  Diagnosis Date  . Asthma   . Diabetes mellitus without complication ([place])   . Myocardial infarction ([place])    Infusions:  . heparin 1,100 Units/hr (04/08/15 2334)  . nitroGLYCERIN 20 mcg/min (04/09/15 0934)    Assessment: 50yo female c/o chest discomfort x1wk,  S/p LHC - with 1 vessel disease but unable to stent due to location - needs single vessel CABG.  Was previously therapeutic at 1100 units/hr, HL low this am at 0.21. CBC appears stable, no bleeding noted.  CABG planned for 04/11/15  Goal of Therapy:  Heparin level 0.3-0.7 units/ml Monitor platelets by anticoagulation protocol: Yes   Plan:  -Increase heparin to 1250 units/hr -Daily HL, CBC -Monitor for s/sx of bleeding  Erin Hearing PharmD., BCPS Clinical Pharmacist Pager [phone] 04/09/2015 10:32 AM

## 2015-04-09 NOTE — Consult Note (Signed)
[address]       [place] [address]             [phone]        Kimberly [name] [place] Medical Record [id number] Date of Birth: [birth year]  Referring: Corky Downs MD Primary Care: No primary care provider on file.  Chief Complaint:   Chest pain  History of Present Illness:     Patient examined, cardiac catheterization in 2-D echocardiogram personally reviewed  50 year old obese diabetic smoker AA female recently moved from [place]. While at work carrying heavy objects she has experienced progressive angina increasing intensity and frequency and relieved by rest. She presented to the emergency department and was admitted with a diagnosis of unstable angina. Cardiac enzymes were minimally elevated. Echocardiogram showed normal LV systolic function with LVH from her history of hypertension. She underwent cardiac catheterization yesterday which shows a 90-95% ostial LAD stenosis. The left main, circumflex was, and RCA had no significant disease. LVEDP is 18. No evidence of valvular disease on cath or echocardiogram. She is recommended for surgical coronary revascularization.   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   Zubrod Score: At the time of surgery this patient's most appropriate activity status/level should be described as: []     0    Normal activity, no symptoms []     1    Restricted in physical strenuous activity but ambulatory, able to do out light work [x]     2    Ambulatory and capable of self care, unable to do work activities, up and about                 more than 50%  Of the time                            []     3    Only limited self care, in bed greater than 50% of waking hours []     4    Completely disabled, no self care, confined to bed or chair []     5    Moribund  Past Medical History  Diagnosis Date  . Asthma   .  Diabetes mellitus without complication ([place])   . Myocardial infarction [facility])     Past Surgical History  Procedure Laterality Date  . Abdominal hysterectomy    . Appendectomy    . Cardiac catheterization N/A 04/08/2015    Procedure: Left Heart Cath and Coronary Angiography;  Surgeon: Jettie Booze, MD;  Location: [facility];  Service: Cardiovascular;  Laterality: N/A;    History  Smoking status  . Current Every Day Smoker  . Types: Cigarettes  Smokeless tobacco  . Not on file    History  Alcohol Use  . Yes    Social History   Social History  . Marital Status: Single    Spouse Name: N/A  . Number of Children: N/A  . Years of Education: N/A   Occupational History  . Not on file.   Social History Main Topics  . Smoking status: Current Every Day Smoker    Types: Cigarettes  . Smokeless tobacco: Not on file  . Alcohol Use: Yes  . Drug Use: No  . Sexual Activity: Not on file   Other Topics Concern  . Not on file   Social History  Narrative    Allergies  Allergen Reactions  . Pyridium [Phenazopyridine Hcl] Itching    Current Facility-Administered Medications  Medication Dose Route Frequency Provider Last Rate Last Dose  . 0.9 %  sodium chloride infusion  250 mL Intravenous PRN Jettie Booze, MD      . acetaminophen (TYLENOL) tablet 650 mg  650 mg Oral Q4H PRN Jettie Booze, MD      . albuterol (PROVENTIL) (2.5 MG/3ML) 0.083% nebulizer solution 2.5 mg  2.5 mg Nebulization Q6H PRN Willia Craze, NP      . ALPRAZolam Duanne Moron) tablet 1 mg  1 mg Oral BID Ivin Poot, MD   1 mg at 04/09/15 0910  . aspirin chewable tablet 81 mg  81 mg Oral Daily Thurnell Lose, MD   81 mg at 04/09/15 0909  . atorvastatin (LIPITOR) tablet 80 mg  80 mg Oral q1800 Thurnell Lose, MD   80 mg at 04/08/15 1758  . carvedilol (COREG) tablet 6.25 mg  6.25 mg Oral BID WC Troy Sine, MD      . cyclobenzaprine (FLEXERIL) tablet 10 mg  10 mg Oral TID PRN Willia Craze, NP      . fluticasone furoate-vilanterol (BREO ELLIPTA) 200-25 MCG/INH 1 puff  1 puff Inhalation Daily Ivin Poot, MD   1 puff at 04/09/15 0927  . heparin ADULT infusion 100 units/mL (25000 units/250 mL)  1,100 Units/hr Intravenous Continuous Wynell Balloon, RPH 11 mL/hr at 04/08/15 2334 1,100 Units/hr at 04/08/15 2334  . HYDROcodone-acetaminophen (NORCO/VICODIN) 5-325 MG per tablet 1-2 tablet  1-2 tablet Oral Q4H PRN Thurnell Lose, MD   2 tablet at 04/09/15 [phone]  . hydrOXYzine (ATARAX/VISTARIL) tablet 25 mg  25 mg Oral TID PRN Jettie Booze, MD      . insulin aspart (novoLOG) injection 0-5 Units  0-5 Units Subcutaneous QHS Thurnell Lose, MD   0 Units at 04/07/15 2131  . insulin aspart (novoLOG) injection 0-9 Units  0-9 Units Subcutaneous TID WC Thurnell Lose, MD   1 Units at 04/09/15 0910  . insulin aspart (novoLOG) injection 5 Units  5 Units Subcutaneous TID WC Thurnell Lose, MD   5 Units at 04/08/15 1857  . insulin glargine (LANTUS) injection 20 Units  20 Units Subcutaneous QHS Lily Kocher, MD   20 Units at 04/08/15 2142  . morphine 2 MG/ML injection 2 mg  2 mg Intravenous Q3H PRN Willia Craze, NP   2 mg at 04/07/15 1038  . nicotine (NICODERM CQ - dosed in mg/24 hours) patch 14 mg  14 mg Transdermal Daily Ivin Poot, MD   14 mg at 04/09/15 0913  . nitroGLYCERIN 50 mg in dextrose 5 % 250 mL (0.2 mg/mL) infusion  0-200 mcg/min Intravenous Titrated Troy Sine, MD      . ondansetron [facility]) injection 4 mg  4 mg Intravenous Q6H PRN Jettie Booze, MD      . sodium chloride flush (NS) 0.9 % injection 3 mL  3 mL Intravenous Q12H Jettie Booze, MD   3 mL at 04/09/15 1000  . sodium chloride flush (NS) 0.9 % injection 3 mL  3 mL Intravenous PRN Jettie Booze, MD      . traZODone (DESYREL) tablet 100 mg  100 mg Oral QHS PRN Jettie Booze, MD        Prescriptions prior to admission  Medication Sig Dispense Refill Last Dose  .  albuterol (PROVENTIL HFA;VENTOLIN HFA) 108 (90 Base) MCG/ACT inhaler Inhale 1 puff into the lungs every 6 (six) hours as needed for wheezing or shortness of breath.   04/06/2015 at am  . albuterol (PROVENTIL) (2.5 MG/3ML) 0.083% nebulizer solution Take 2.5 mg by nebulization every 6 (six) hours as needed for wheezing or shortness of breath.   04/05/2015 at Unknown time  . amphetamine-dextroamphetamine (ADDERALL) 20 MG tablet Take 20 mg by mouth 2 (two) times daily.   couple days ago  . aspirin EC 81 MG tablet Take 81 mg by mouth daily.   several weeks ago  . cyclobenzaprine (FLEXERIL) 10 MG tablet Take 10 mg by mouth 3 (three) times daily as needed for muscle spasms.   couple days ago  . hydrOXYzine (ATARAX/VISTARIL) 25 MG tablet Take 25 mg by mouth 3 (three) times daily as needed for anxiety.   few days ago  . insulin glargine (LANTUS) 100 unit/mL SOPN Inject 30 Units into the skin daily after breakfast.   04/05/2015 at Unknown time  . Insulin Glulisine (APIDRA SOLOSTAR) 100 UNIT/ML Solostar Pen Inject 22 Units into the skin 2 (two) times daily after a meal.   04/05/2015 at Unknown time  . lovastatin (MEVACOR) 20 MG tablet Take 20 mg by mouth daily at 12 noon.   2 weeks ago  . Menthol-Methyl Salicylate (MUSCLE RUB EX) Apply 1 application topically 2 (two) times daily as needed (pain).   couple days ago  . traZODone (DESYREL) 100 MG tablet Take 100 mg by mouth at bedtime as needed for sleep.   couple weeks ago    Family History  Problem Relation Age of Onset  . Heart disease      grandfather  . Diabetes      mother     Review of Systems:       Cardiac Review of Systems: Y or N  Chest Pain [   yes ]  Resting SOB [  no ] Exertional SOB  [place]  ]  Orthopnea [no  ]   Pedal Edema [ no  ]    Palpitations [ no ] Syncope  [no  ]   Presyncope [  no]  General Review of Systems: [Y] = yes [  ]=no Constitional: recent weight change [  ]; anorexia [  ]; fatigue [  ]; nausea [  ]; night sweats [  ]; fever  [  ]; or chills [  ]                                                               Dental: poor dentition[ yes, no loose teeth ]; Last Dentist visit: Greater than one year  Eye : blurred vision [  ]; diplopia [   ]; vision changes [  ];  Amaurosis fugax[  ]; Resp: cough [  ];  wheezing[yes  ];  hemoptysis[  ]; shortness of breath[  ]; paroxysmal nocturnal dyspnea[  ]; dyspnea on exertion[yes  ]; or orthopnea[  ];  GI:  gallstones[  ], vomiting[  ];  dysphagia[  ]; melena[  ];  hematochezia [  ]; heartburn[  ];   Hx of  Colonoscopy[  ]; GU: kidney stones [  ]; hematuria[  ];   dysuria [  ];  nocturia[  ];  history of     obstruction [  ]; urinary frequency [  ]             Skin: rash, swelling[  ];, hair loss[  ];  peripheral edema[  ];  or itching[  ]; Musculosketetal: myalgias[  ];  joint swelling[  ];  joint erythema[  ];  joint pain[  ];  back pain[  ];  Heme/Lymph: bruising[  ];  bleeding[  ];  anemia[  ];  Neuro: TIA[  ];  headaches[  ];  stroke[  ];  vertigo[  ];  seizures[  ];   paresthesias[  ];  difficulty walking[  ];  Psych:depression[  ]; anxiety[  ];  Endocrine: diabetes[ yes ];  thyroid dysfunction[  ];  Immunizations: Flu [  ]; Pneumococcal[  ];  Other: Left-hand-dominant, no history of thoracic trauma pneumothorax or rib fracture  Physical Exam: BP 145/83 mmHg  Pulse 67  Temp(Src) 97.7 F (36.5 C) (Oral)  Resp 21  Ht 5\' 6"  (1.676 m)  Wt 186 lb 12.8 oz (84.732 kg)  BMI 30.16 kg/m2  SpO2 98%       Physical Exam  General: Middle-aged overweight AA female the CCU no acute distress HEENT: Normocephalic pupils equal , dentition suboptimal Neck: Supple without JVD, adenopathy, or bruit Chest: Clear to auscultation, symmetrical breath sounds, no rhonchi, no tenderness             or deformity Cardiovascular: Regular rate and rhythm, no murmur, no gallop, peripheral pulses             palpable in all extremities Abdomen:  Soft, nontender, no palpable mass or  organomegaly Extremities: Warm, well-perfused, no clubbing cyanosis edema or tenderness, no hematoma and right wrist radial artery cath site              no venous stasis changes of the legs Rectal/GU: Deferred Neuro: Grossly non--focal and symmetrical throughout Skin: Clean and dry without rash or ulceration   Diagnostic Studies & Laboratory data:     Recent Radiology Findings:   No results found.   I have independently reviewed the above radiologic studies.  Recent Lab Findings: Lab Results  Component Value Date   WBC 6.2 04/09/2015   HGB 12.9 04/09/2015   HCT 38.7 04/09/2015   PLT 274 04/09/2015   GLUCOSE 211* 04/06/2015   CHOL 204* 04/06/2015   TRIG 266* 04/06/2015   HDL 48 04/06/2015   LDLCALC 103* 04/06/2015   NA 137 04/06/2015   K 4.2 04/06/2015   CL 104 04/06/2015   CREATININE 0.71 04/06/2015   BUN 13 04/06/2015   CO2 23 04/06/2015   TSH 0.654 04/09/2015   INR 1.00 04/09/2015   HGBA1C 8.0* 04/07/2015      Assessment / Plan:     Unstable angina with severe ostial LAD stenosis greater than 90%, non-ST elevation MI with preserved LV systolic function  Poorly controlled diabetes with a Hb A1c greater than 8.0 Active smoking, bronchitis  Plan: Surgical coronary revascularization with left IMA to LAD planned a.m. March 30. Procedure indications benefits and risks discussed with patient and she indicates her understanding and agrees to proceed with surgery.

## 2015-04-09 NOTE — Progress Notes (Signed)
Pre-op Cardiac Surgery  Carotid Findings:  Bilateral: No significant (1-39%) ICA stenosis. Antegrade vertebral flow.    Upper Extremity Right Left  Brachial Pressures 127 115  Radial Waveforms Triphasic Triphasic  Ulnar Waveforms Triphasic Triphasic  Palmar Arch (Allen's Test) Decreases >50% with radial compression, obliterates with ulnar compression Decreases >50% with radial compression, normal with ulnar compression       Lower  Extremity Right Left      Anterior Tibial Triphasic  Triphasic  Posterior Tibial Triphasic Triphasic        Landry Mellow, RDMS, RVT 04/09/2015

## 2015-04-09 NOTE — Progress Notes (Signed)
ANTICOAGULATION CONSULT NOTE - Follow Up Consult  Pharmacy Consult for heparin Indication: chest pain/ACS  Allergies  Allergen Reactions  . Pyridium [Phenazopyridine Hcl] Itching    Patient Measurements: Height: 5\' 6"  (167.6 cm) Weight: 186 lb 12.8 oz (84.732 kg) IBW/kg (Calculated) : 59.3 Heparin Dosing Weight: 80kg  Vital Signs: Temp: 97.6 F (36.4 C) (03/28 1608) Temp Source: Oral (03/28 1608) BP: 127/86 mmHg (03/28 1608) Pulse Rate: 89 (03/28 1608)  Labs:  Recent Labs  04/07/15 1042 04/07/15 1503 04/07/15 2135  04/08/15 0708 04/09/15 0250 04/09/15 0808 04/09/15 1650  HGB  --   --   --   --  13.0 12.9  --   --   HCT  --   --   --   --  38.0 38.7  --   --   PLT  --   --   --   --  268 274  --   --   LABPROT  --  13.5  --   --   --  13.4  --   --   INR  --  1.01  --   --   --  1.00  --   --   HEPARINUNFRC  --   --   --   < > 0.55 0.27* 0.21* 0.47  TROPONINI 0.03 0.03 <0.03  --   --   --   --   --   < > = values in this interval not displayed.  Estimated Creatinine Clearance: 91.3 mL/min (by C-G formula based on Cr of 0.71).   Medical History: Past Medical History  Diagnosis Date  . Asthma   . Diabetes mellitus without complication ([place])   . Myocardial infarction ([place])    Infusions:  . heparin 1,250 Units/hr (04/09/15 1036)  . nitroGLYCERIN 20 mcg/min (04/09/15 0934)   Assessment: 50yo female c/o chest discomfort x1wk,  S/p LHC - with 1 vessel disease but unable to stent due to location - needs single vessel CABG.  HL 0.47, therapeutic on 1250 units/hr  CABG planned for 04/11/15  Goal of Therapy:  Heparin level 0.3-0.7 units/ml Monitor platelets by anticoagulation protocol: Yes   Plan:  - Continue heparin to 1250 units/hr - Daily HL, CBC - Monitor for s/sx of bleeding  Vincenza Hews, PharmD, BCPS 04/09/2015, 8:28 PM Pager: [phone]

## 2015-04-10 DIAGNOSIS — G4440 Drug-induced headache, not elsewhere classified, not intractable: Secondary | ICD-10-CM

## 2015-04-10 LAB — PREPARE RBC (CROSSMATCH)

## 2015-04-10 LAB — GLUCOSE, CAPILLARY
GLUCOSE-CAPILLARY: 193 mg/dL — AB (ref 65–99)
Glucose-Capillary: 198 mg/dL — ABNORMAL HIGH (ref 65–99)
Glucose-Capillary: 180 mg/dL — ABNORMAL HIGH (ref 65–99)
Glucose-Capillary: 211 mg/dL — ABNORMAL HIGH (ref 65–99)

## 2015-04-10 LAB — CBC
HEMATOCRIT: 37.5 % (ref 36.0–46.0)
HEMOGLOBIN: 13.3 g/dL (ref 12.0–15.0)
MCH: 30 pg (ref 26.0–34.0)
MCHC: 35.5 g/dL (ref 30.0–36.0)
MCV: 84.7 fL (ref 78.0–100.0)
Platelets: 262 10*3/uL (ref 150–400)
RBC: 4.43 MIL/uL (ref 3.87–5.11)
RDW: 12.6 % (ref 11.5–15.5)
WBC: 5.6 10*3/uL (ref 4.0–10.5)

## 2015-04-10 LAB — HEPARIN LEVEL (UNFRACTIONATED): Heparin Unfractionated: 0.55 IU/mL (ref 0.30–0.70)

## 2015-04-10 LAB — ABO/RH: ABO/RH(D): B POS

## 2015-04-10 MED ORDER — PHENYLEPHRINE HCL 10 MG/ML IJ SOLN
30.0000 ug/min | INTRAVENOUS | Status: DC
  Filled 2015-04-10: qty 2

## 2015-04-10 MED ORDER — METOPROLOL TARTRATE 12.5 MG HALF TABLET
12.5000 mg | ORAL_TABLET | Freq: Once | ORAL | Status: AC
  Administered 2015-04-11: 12.5 mg via ORAL
  Filled 2015-04-10: qty 1

## 2015-04-10 MED ORDER — PLASMA-LYTE 148 IV SOLN
INTRAVENOUS | Status: AC
  Administered 2015-04-11: 500 mL
  Filled 2015-04-10: qty 2.5

## 2015-04-10 MED ORDER — CHLORHEXIDINE GLUCONATE CLOTH 2 % EX PADS: 6.0000 | MEDICATED_PAD | Freq: Once | CUTANEOUS | Status: DC

## 2015-04-10 MED ORDER — SODIUM CHLORIDE 0.9 % IV SOLN
INTRAVENOUS | Status: AC
  Administered 2015-04-11: 1.9 [IU]/h via INTRAVENOUS
  Filled 2015-04-10: qty 2.5

## 2015-04-10 MED ORDER — CHLORHEXIDINE GLUCONATE 0.12 % MT SOLN
15.0000 mL | Freq: Once | OROMUCOSAL | Status: AC
  Administered 2015-04-11: 15 mL via OROMUCOSAL
  Filled 2015-04-10: qty 15

## 2015-04-10 MED ORDER — CEFUROXIME SODIUM 1.5 G IJ SOLR
1.5000 g | INTRAMUSCULAR | Status: AC
  Administered 2015-04-11: 1.5 mg via INTRAVENOUS
  Administered 2015-04-11: 750 mg via INTRAVENOUS
  Filled 2015-04-10: qty 1.5

## 2015-04-10 MED ORDER — POTASSIUM CHLORIDE 2 MEQ/ML IV SOLN
80.0000 meq | INTRAVENOUS | Status: DC
  Filled 2015-04-10: qty 40

## 2015-04-10 MED ORDER — SODIUM CHLORIDE 0.9 % IV SOLN
INTRAVENOUS | Status: DC
  Filled 2015-04-10: qty 30

## 2015-04-10 MED ORDER — MAGNESIUM SULFATE 50 % IJ SOLN
40.0000 meq | INTRAMUSCULAR | Status: DC
  Filled 2015-04-10: qty 10

## 2015-04-10 MED ORDER — NITROGLYCERIN IN D5W 200-5 MCG/ML-% IV SOLN
2.0000 ug/min | INTRAVENOUS | Status: AC
  Administered 2015-04-11: 15 ug/min via INTRAVENOUS
  Filled 2015-04-10: qty 250

## 2015-04-10 MED ORDER — DIAZEPAM 5 MG PO TABS
5.0000 mg | ORAL_TABLET | Freq: Once | ORAL | Status: AC
  Administered 2015-04-11: 5 mg via ORAL
  Filled 2015-04-10: qty 1

## 2015-04-10 MED ORDER — VANCOMYCIN HCL 10 G IV SOLR
1250.0000 mg | INTRAVENOUS | Status: AC
  Administered 2015-04-11: 1250 mg via INTRAVENOUS
  Filled 2015-04-10: qty 1250

## 2015-04-10 MED ORDER — CHLORHEXIDINE GLUCONATE CLOTH 2 % EX PADS
6.0000 | MEDICATED_PAD | Freq: Once | CUTANEOUS | Status: AC
  Administered 2015-04-10: 6 via TOPICAL

## 2015-04-10 MED ORDER — DEXTROSE 5 % IV SOLN
750.0000 mg | INTRAVENOUS | Status: DC
  Filled 2015-04-10: qty 750

## 2015-04-10 MED ORDER — SORBITOL 70 % SOLN
60.0000 mL | Freq: Once | Status: AC
  Administered 2015-04-10: 60 mL via ORAL

## 2015-04-10 MED ORDER — DEXTROSE 5 % IV SOLN
0.0000 ug/min | INTRAVENOUS | Status: DC
  Filled 2015-04-10: qty 4

## 2015-04-10 MED ORDER — DOPAMINE-DEXTROSE 3.2-5 MG/ML-% IV SOLN
0.0000 ug/kg/min | INTRAVENOUS | Status: DC
  Filled 2015-04-10: qty 250

## 2015-04-10 MED ORDER — SODIUM CHLORIDE 0.9 % IV SOLN
INTRAVENOUS | Status: AC
  Administered 2015-04-11: 69.8 mL/h via INTRAVENOUS
  Filled 2015-04-10: qty 40

## 2015-04-10 MED ORDER — ACETAMINOPHEN 325 MG PO TABS: 650.0000 mg | ORAL_TABLET | Freq: Four times a day (QID) | ORAL | Status: DC | PRN

## 2015-04-10 MED ORDER — TEMAZEPAM 15 MG PO CAPS: 15.0000 mg | ORAL_CAPSULE | Freq: Once | ORAL | Status: DC | PRN

## 2015-04-10 MED ORDER — BISACODYL 5 MG PO TBEC
5.0000 mg | DELAYED_RELEASE_TABLET | Freq: Once | ORAL | Status: AC
  Administered 2015-04-10: 5 mg via ORAL
  Filled 2015-04-10: qty 1

## 2015-04-10 MED ORDER — DEXMEDETOMIDINE HCL IN NACL 400 MCG/100ML IV SOLN
0.1000 ug/kg/h | INTRAVENOUS | Status: AC
  Administered 2015-04-11: .3 ug/kg/h via INTRAVENOUS
  Filled 2015-04-10: qty 100

## 2015-04-10 NOTE — Anesthesia Preprocedure Evaluation (Addendum)
Anesthesia Evaluation  Patient identified by MRN, date of birth, ID band Patient awake    Reviewed: Allergy & Precautions, NPO status , Patient's Chart, lab work & pertinent test results  Airway Mallampati: II  TM Distance: >3 FB Neck ROM: Full    Dental  (+) Missing, Poor Dentition, Chipped, Dental Advisory Given,    Pulmonary asthma , Current Smoker,    breath sounds clear to auscultation       Cardiovascular hypertension, + angina + Past MI   Rhythm:Regular Rate:Normal     Neuro/Psych    GI/Hepatic   Endo/Other  diabetes, Poorly Controlled, Type 2, Insulin Dependent  Renal/GU      Musculoskeletal   Abdominal   Peds  Hematology   Anesthesia Other Findings   Reproductive/Obstetrics                           Anesthesia Physical Anesthesia Plan  ASA: IV  Anesthesia Plan: General   Post-op Pain Management:    Induction: Intravenous  Airway Management Planned: Oral ETT  Additional Equipment: Arterial line, CVP, PA Cath, 3D TEE and Ultrasound Guidance Line Placement  Intra-op Plan:   Post-operative Plan: Post-operative intubation/ventilation  Informed Consent: I have reviewed the patients History and Physical, chart, labs and discussed the procedure including the risks, benefits and alternatives for the proposed anesthesia with the patient or authorized representative who has indicated his/her understanding and acceptance.   Dental advisory given  Plan Discussed with: CRNA, Anesthesiologist and Surgeon  Anesthesia Plan Comments:         Anesthesia Quick Evaluation

## 2015-04-10 NOTE — Progress Notes (Signed)
2 Days Post-Op Procedure(s) (LRB): Left Heart Cath and Coronary Angiography (N/A) Subjective: Patient remained stable without angina on IV heparin and nitroglycerin for 95% ostial LAD stenosis. Preoperative Dopplers and PFTs reviewed and are satisfactory Plan CABG in a.m. Procedure discussed in detail with patient including indications benefits alternatives and risks.  Objective: Vital signs in last 24 hours: Temp:  [97.4 F (36.3 C)-98.3 F (36.8 C)] 97.9 F (36.6 C) (03/29 0800) Pulse Rate:  [77-89] 77 (03/29 0800) Cardiac Rhythm:  [-] Normal sinus rhythm (03/29 0701) Resp:  [9-23] 16 (03/29 0800) BP: (107-127)/(66-86) 107/70 mmHg (03/29 0800) SpO2:  [98 %] 98 % (03/29 0912)  Hemodynamic parameters for last 24 hours:  stable  Intake/Output from previous day: 03/28 0701 - 03/29 0700 In: 779.5 [P.O.:400; I.V.:379.5] Out: -  Intake/Output this shift: Total I/O In: 120 [P.O.:120] Out: -        Exam    General- alert and comfortable   Lungs- clear without rales, wheezes   Cor- regular rate and rhythm, no murmur , gallop   Abdomen- soft, non-tender   Extremities - warm, non-tender, minimal edema   Neuro- oriented, appropriate, no focal weakness   Lab Results:  Recent Labs  04/09/15 0250 04/10/15 0420  WBC 6.2 5.6  HGB 12.9 13.3  HCT 38.7 37.5  PLT 274 262   BMET: No results for input(s): NA, K, CL, CO2, GLUCOSE, BUN, CREATININE, CALCIUM in the last 72 hours.  PT/INR:  Recent Labs  04/09/15 0250  LABPROT 13.4  INR 1.00   ABG No results found for: PHART, HCO3, TCO2, ACIDBASEDEF, O2SAT CBG (last 3)   Recent Labs  04/09/15 1607 04/09/15 2149 04/10/15 0804  GLUCAP 151* 165* 193*    Assessment/Plan: S/P Procedure(s) (LRB): Left Heart Cath and Coronary Angiography (N/A) CABG in a.m.   LOS: 2 days    [name] 04/10/2015

## 2015-04-10 NOTE — Progress Notes (Addendum)
Spoke w husband. Pt and husband moving to [place] from [place]. husb to start new job.they are staying at motel at present. Husband not sure who will be with pt after dsich. 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 Will speak w pt again later today. Pt sleeping on return visit. Left [facility]  List.

## 2015-04-10 NOTE — Progress Notes (Signed)
CARDIAC REHAB PHASE I   Pt with intermittent chest pain, holding ambulation until after surgery per MD. Pre-op education completed with pt and husband at bedside. Reviewed IS, sternal precautions, activity progression, cardiac surgery booklet and cardiac surgery guidelines. Provided instructions to view cardiac surgery videos. Pt verbalized understanding. Pt states she is not certain she will have 24 hour care upon discharge from the hospital and is concerned as she is currently living in a [address]. CM and CSW have been consulted per orders. Pt also requesting information to complete advanced directive prior to surgery, RN notified. Pt in bed, call bell within reach. Will follow post-op.   [place], RN, BSN 04/10/2015 12:12 PM

## 2015-04-10 NOTE — Progress Notes (Signed)
   04/10/15 1703  Clinical Encounter Type  Visited With Patient and family together  Visit Type Initial;Critical Care  Referral From [place] responded to and complete a request for an advanced directive. Copy of advance directive is now in patient's chart.

## 2015-04-10 NOTE — Progress Notes (Signed)
ANTICOAGULATION CONSULT NOTE - Follow Up Consult  Pharmacy Consult for heparin Indication: chest pain/ACS  Allergies  Allergen Reactions  . Pyridium [Phenazopyridine Hcl] Itching    Patient Measurements: Height: 5\' 6"  (167.6 cm) Weight: 186 lb 12.8 oz (84.732 kg) IBW/kg (Calculated) : 59.3 Heparin Dosing Weight: 80kg  Vital Signs: Temp: 97.3 F (36.3 C) (03/29 1149) Temp Source: Oral (03/29 1149) BP: 107/86 mmHg (03/29 1149) Pulse Rate: 86 (03/29 1149)  Labs:  Recent Labs  04/07/15 1503 04/07/15 2135  04/08/15 0708 04/09/15 0250 04/09/15 0808 04/09/15 1650 04/10/15 0420  HGB  --   --   < > 13.0 12.9  --   --  13.3  HCT  --   --   --  38.0 38.7  --   --  37.5  PLT  --   --   --  268 274  --   --  262  LABPROT 13.5  --   --   --  13.4  --   --   --   INR 1.01  --   --   --  1.00  --   --   --   HEPARINUNFRC  --   --   < > 0.55 0.27* 0.21* 0.47 0.55  TROPONINI 0.03 <0.03  --   --   --   --   --   --   < > = values in this interval not displayed.  Estimated Creatinine Clearance: 91.3 mL/min (by C-G formula based on Cr of 0.71).   Medical History: Past Medical History  Diagnosis Date  . Asthma   . Diabetes mellitus without complication ([place])   . Myocardial infarction ([place])    Infusions:  . heparin 1,250 Units (04/09/15 2000)  . nitroGLYCERIN 6 mcg/min (04/10/15 1000)   Assessment: 50yo female c/o chest discomfort x1wk,  S/p LHC - with 1 vessel disease but unable to stent due to location - needs single vessel CABG (planned for 3/30) -HL 0.55, therapeutic on 1250 units/hr   Goal of Therapy:  Heparin level 0.3-0.7 units/ml Monitor platelets by anticoagulation protocol: Yes   Plan:  - Continue heparin to 1250 units/hr - Daily HL, CBC  Hildred Laser, Pharm D 04/10/2015 1:17 PM

## 2015-04-10 NOTE — Progress Notes (Signed)
Subjective:  No chest pain on iv NTG and heparin; c/o mild headache  Objective:   Vital Signs : Filed Vitals:   04/10/15 0447 04/10/15 0500 04/10/15 0600 04/10/15 0800  BP: 121/79   107/70  Pulse:    77  Temp: 97.8 F (36.6 C)   97.9 F (36.6 C)  TempSrc: Oral   Oral  Resp: 12 23 11 16   Height:      Weight:      SpO2: 98%   98%    Intake/Output from previous day:  Intake/Output Summary (Last 24 hours) at 04/10/15 0833 Last data filed at 04/10/15 0600  Gross per 24 hour  Intake  768.5 ml  Output      0 ml  Net  768.5 ml    I/O since admission: +2132     Wt Readings from Last 3 Encounters:  04/08/15 186 lb 12.8 oz (84.732 kg)    Medications: . ALPRAZolam  1 mg Oral BID  . aspirin  81 mg Oral Daily  . atorvastatin  80 mg Oral q1800  . carvedilol  6.25 mg Oral BID WC  . fluticasone furoate-vilanterol  1 puff Inhalation Daily  . insulin aspart  0-5 Units Subcutaneous QHS  . insulin aspart  0-9 Units Subcutaneous TID WC  . insulin aspart  5 Units Subcutaneous TID WC  . insulin glargine  20 Units Subcutaneous QHS  . nicotine  14 mg Transdermal Daily  . sodium chloride flush  3 mL Intravenous Q12H    . heparin 1,250 Units (04/09/15 2000)  . nitroGLYCERIN 6 mcg/min (04/09/15 2000)    Physical Exam:   General appearance: alert, cooperative and no distress Neck: no adenopathy, no carotid bruit, no JVD, supple, symmetrical, trachea midline and thyroid not enlarged, symmetric, no tenderness/mass/nodules Lungs: slightly decreased BS at bases Heart: regular rate and rhythm; 1/6 Abdomen: soft, non-tender; bowel sounds normal; no masses,  no organomegaly Extremities: extremities normal, atraumatic, no cyanosis or edema Pulses: 2+ and symmetric Skin: Skin color, texture, turgor normal. No rashes or lesions Neurologic: Grossly normal   Rate: 70  Rhythm: normal sinus rhythm  ECG (independently read by me): NSR at 64 withiout significant STT changes  Lab  Results:  No results for input(s): NA, K, CL, CO2, GLUCOSE, BUN, CREATININE, CALCIUM, MG, PHOS in the last 72 hours.  No flowsheet data found.   Recent Labs  04/08/15 0708 04/09/15 0250 04/10/15 0420  WBC 5.9 6.2 5.6  HGB 13.0 12.9 13.3  HCT 38.0 38.7 37.5  MCV 84.4 86.0 84.7  PLT 268 274 262   BMP Latest Ref Rng 04/06/2015  Glucose 65 - 99 mg/dL 211(H)  BUN 6 - 20 mg/dL 13  Creatinine 0.44 - 1.00 mg/dL 0.71  Sodium 135 - 145 mmol/L 137  Potassium 3.5 - 5.1 mmol/L 4.2  Chloride 101 - 111 mmol/L 104  CO2 22 - 32 mmol/L 23  Calcium 8.9 - 10.3 mg/dL 9.3     Recent Labs  04/07/15 1042 04/07/15 1503 04/07/15 2135  TROPONINI 0.03 0.03 <0.03    Lab Results  Component Value Date   TSH 0.654 04/09/2015    Recent Labs  04/07/15 1042  HGBA1C 8.0*    No results for input(s): PROT, ALBUMIN, AST, ALT, ALKPHOS, BILITOT, BILIDIR, IBILI in the last 72 hours.  Recent Labs  04/09/15 0250  INR 1.00   BNP (last 3 results) No results for input(s): BNP in the last 8760 hours.  ProBNP (last 3 results) No  results for input(s): PROBNP in the last 8760 hours.   Lipid Panel     Component Value Date/Time   CHOL 204* 04/06/2015 1816   TRIG 266* 04/06/2015 1816   HDL 48 04/06/2015 1816   CHOLHDL 4.3 04/06/2015 1816   VLDL 53* 04/06/2015 1816   LDLCALC 103* 04/06/2015 1816      Imaging:  Cardiac Cath 04/08/15: Procedures    Left Heart Cath and Coronary Angiography    Conclusion     Ost LAD to Prox LAD lesion, 95% stenosed.  Severe tortuosity in the right subclavian made torquing catheters quite difficult. Would not use the right radial in the future for access site for cardiac cath.  Severe ostial LAD stenosis. There is no landing zone for stent. Fixing this percutaneously would require leaving stents in the distal left main and putting a large ramus and circumflex at risk to be jailed. She would also likely have to be on lifelong clopidogrel. I think a better  option would be to have a surgical consult for possible LIMA to LAD. She has a good target vessel in the mid to distal LAD.   ------------------------------------------------------------------- ECHO Study Conclusions  - Left ventricle: Distal septal hypokinesis The cavity size was  mildly dilated. Wall thickness was normal. Systolic function was  normal. The estimated ejection fraction was in the range of 50%  to 55%. Doppler parameters are consistent with elevated  ventricular end-diastolic filling pressure. - Aortic valve: There was mild regurgitation. - Atrial septum: No defect or patent foramen ovale was identified.   Assessment/Plan:   Active Problems:   Chest tightness or pressure   Chest pain   Diabetes mellitus, type 2 (HCC)   Hyperlipidemia   ADHD (attention deficit hyperactivity disorder)   Tobacco abuse   Asthma   Hypertensive heart disease without heart failure   Unstable angina ([place])  1. Unstable Angina secondary to 95% ostial LAD stenosis: Reviewed cath data again with patient. No recurrent angina now on now on iv heparin,  iv NTG and increased carvedilol dose yesterday to 6.25 mg bid. No good landing site for PCI.  Dr. Lawson Fiscal plans CABG Thursday am with LIMA to LAD.   2. Hypertensive heart disease- Now on BB, nitrates. BP now 107 70.  3. Hyperlipidemia - LDL was 103. She was on rosuvastatin 10 mg daily at home; now on atorvastatin at 80 mg. No myalgias  4. Headache: nitrate mediated, improved with oral meds  5. DM - per IM  6. Tobacco abuse- Another long discussion concerning discontinuance and increased graft attrition if she resumes. Her husband will also need to dc tobacco  Should initiate cardiac rehab post CABG.    Troy Sine, MD, [facility] 04/10/2015, 8:33 AM

## 2015-04-10 NOTE — Progress Notes (Signed)
TRH Progress Note                                            Patient Demographics:    Kimberly [name], is a 50 y.o. female, DOB - Aug 30, 1965, PH:[id number]  Admit date - 04/06/2015   Admitting Physician Lily Kocher, MD  Outpatient Primary MD for the patient is No primary care provider on file.  LOS -   Outpatient Specialists:   No chief complaint on file.     Summary  Kimberly [name] is a 49 y.o. female who recently relocated here from [place]. Patient in the ED with a four-day history of substernal chest pain and tightness radiating through / around to back. Patient had several risk factors for CAD therefore cardiology was consulted, she underwent left heart catheterization which was positive for 90% LAD lesion, subsequently cardiothoracic surgery was consulted and patient now is due for a CABG on 04/11/2015, currently in stepdown being followed by cardiology on heparin and nitroglycerin drip.    Subjective:    Kimberly [name] today has, Mild generalized headache but no chest pain, No abdominal pain - No Nausea, No new weakness tingling or numbness, No Cough - SOB.     Assessment  & Plan :     1.Chest pain in a patient with multiple risk factors. EKG nonacute, -ve troponin x 3, seen by cardiology, L Heart cath with 90% LAD lesion, now on Hep gtt, continue aspirin, statin, beta blocker, Heparin and nitroglycerin drip per cardiology, cardiothoracic surgery has been consulted, patient due for CABG on 04/11/2015.  2. Ongoing smoking. Counseled to quit.  3. Essential hypertension. Added Coreg to Nitropaste will monitor.  4. Dyslipidemia. On statin dose increased as LDL > 70.  5. DM type II. Currently on Lantus and sliding scale will  continue to titrate.  Lab Results  Component Value Date   HGBA1C 8.0* 04/07/2015    CBG (last 3)   Recent Labs  04/09/15 1607 04/09/15 2149 04/10/15 0804  GLUCAP 151* 165* 193*      Code Status : Full  Family Communication  : husband  Disposition Plan  : stay inpt  Barriers For Discharge : CABG due  Consults  :  Cards, TCTS  Procedures  :    L Heart Cath   Ost LAD to Prox LAD lesion, 95% stenosed.  Severe tortuosity in the right subclavian made torquing catheters quite difficult. Would not use the right radial in the future for access site for cardiac cath.  Severe ostial LAD stenosis. There is no landing zone for stent. Fixing this percutaneously would require leaving stents in the distal left main and putting a large ramus and circumflex at risk to be jailed. She would also likely have to be on lifelong clopidogrel. I think a better option would be to have a surgical consult for possible LIMA to LAD. She has a good target vessel in the mid to distal LAD.  We'll restart heparin in 6 hours. Transfer to stepdown unit. Obtain echocardiogram. This was discussed with Dr. Claiborne Billings.   DVT Prophylaxis  :  Heparin  Lab Results  Component Value Date   PLT 262 04/10/2015    Antibiotics  :    Anti-infectives    None  Objective:   Filed Vitals:   04/10/15 0500 04/10/15 0600 04/10/15 0800 04/10/15 0912  BP:   107/70   Pulse:   77   Temp:   97.9 F (36.6 C)   TempSrc:   Oral   Resp: 23 11 16    Height:      Weight:      SpO2:   98% 98%    Wt Readings from Last 3 Encounters:  04/08/15 84.732 kg (186 lb 12.8 oz)     Intake/Output Summary (Last 24 hours) at 04/10/15 1000 Last data filed at 04/10/15 0900  Gross per 24 hour  Intake  871.5 ml  Output      0 ml  Net  871.5 ml     Physical Exam  Awake Alert, Oriented X 3, No new F.N deficits, Normal affect [place].AT,PERRAL Supple Neck,No JVD, No cervical lymphadenopathy appriciated.  Symmetrical  Chest wall movement, Good air movement bilaterally, CTAB RRR,No Gallops,Rubs or new Murmurs, No Parasternal Heave +ve B.Sounds, Abd Soft, No tenderness, No organomegaly appriciated, No rebound - guarding or rigidity. No Cyanosis, Clubbing or edema, No new Rash or bruise       Data Review:    CBC  Recent Labs Lab 04/06/15 1420 04/08/15 0708 04/09/15 0250 04/10/15 0420  WBC 6.2 5.9 6.2 5.6  HGB 13.5 13.0 12.9 13.3  HCT 39.8 38.0 38.7 37.5  PLT 315 268 274 262  MCV 86.0 84.4 86.0 84.7  MCH 29.2 28.9 28.7 30.0  MCHC 33.9 34.2 33.3 35.5  RDW 12.9 12.6 12.8 12.6    Chemistries   Recent Labs Lab 04/06/15 1420  NA 137  K 4.2  CL 104  CO2 23  GLUCOSE 211*  BUN 13  CREATININE 0.71  CALCIUM 9.3   ------------------------------------------------------------------------------------------------------------------ No results for input(s): CHOL, HDL, LDLCALC, TRIG, CHOLHDL, LDLDIRECT in the last 72 hours.  Lab Results  Component Value Date   HGBA1C 8.0* 04/07/2015   ------------------------------------------------------------------------------------------------------------------  Recent Labs  04/09/15 0250  TSH 0.654   ------------------------------------------------------------------------------------------------------------------ No results for input(s): VITAMINB12, FOLATE, FERRITIN, TIBC, IRON, RETICCTPCT in the last 72 hours.  Coagulation profile  Recent Labs Lab 04/07/15 1503 04/09/15 0250  INR 1.01 1.00    No results for input(s): DDIMER in the last 72 hours.  Cardiac Enzymes  Recent Labs Lab 04/07/15 1042 04/07/15 1503 04/07/15 2135  TROPONINI 0.03 0.03 <0.03   ------------------------------------------------------------------------------------------------------------------ No results found for: BNP  Inpatient Medications  Scheduled Meds: . ALPRAZolam  1 mg Oral BID  . aspirin  81 mg Oral Daily  . atorvastatin  80 mg Oral q1800  .  carvedilol  6.25 mg Oral BID WC  . fluticasone furoate-vilanterol  1 puff Inhalation Daily  . insulin aspart  0-5 Units Subcutaneous QHS  . insulin aspart  0-9 Units Subcutaneous TID WC  . insulin aspart  5 Units Subcutaneous TID WC  . insulin glargine  20 Units Subcutaneous QHS  . nicotine  14 mg Transdermal Daily   Continuous Infusions: . heparin 1,250 Units (04/09/15 2000)  . nitroGLYCERIN 6 mcg/min (04/09/15 2000)   PRN Meds:.acetaminophen, albuterol, cyclobenzaprine, hydrOXYzine, morphine injection, ondansetron (ZOFRAN) IV, traZODone  Micro Results Recent Results (from the past 240 hour(s))  MRSA PCR Screening     Status: None   Collection Time: 04/08/15  4:00 PM  Result Value Ref Range Status   MRSA by PCR NEGATIVE NEGATIVE Final    Comment:        The GeneXpert MRSA Assay (FDA approved for  NASAL specimens only), is one component of a comprehensive MRSA colonization surveillance program. It is not intended to diagnose MRSA infection nor to guide or monitor treatment for MRSA infections.   Surgical pcr screen     Status: None   Collection Time: 04/08/15  7:43 PM  Result Value Ref Range Status   MRSA, PCR NEGATIVE NEGATIVE Final   Staphylococcus aureus NEGATIVE NEGATIVE Final    Comment:        The Xpert SA Assay (FDA approved for NASAL specimens in patients over 73 years of age), is one component of a comprehensive surveillance program.  Test performance has been validated by [facility] for patients greater than or equal to 21 year old. It is not intended to diagnose infection nor to guide or monitor treatment.     Radiology Reports Dg Chest 2 View  04/06/2015  CLINICAL DATA:  Pt reports chest tightness x 3-4 days; pt reports h/o DM, asthma, and MI 2 yrs ago; smoker EXAM: CHEST  2 VIEW COMPARISON:  None. FINDINGS: The heart size and mediastinal contours are within normal limits. Both lungs are clear. No pleural effusion or pneumothorax. The visualized  skeletal structures are unremarkable. IMPRESSION: No active cardiopulmonary disease. Electronically Signed   By: Lajean Manes M.D.   On: 04/06/2015 15:03    Time Spent in minutes 35   SINGH,PRASHANT K M.D on 04/10/2015 at 10:00 AM  Between 7am to 7pm - Pager - [phone]  After 7pm go to [email or web address] - password [facility]  [facility] -  Office  [phone]

## 2015-04-11 ENCOUNTER — Inpatient Hospital Stay (HOSPITAL_COMMUNITY): Payer: Self-pay

## 2015-04-11 ENCOUNTER — Inpatient Hospital Stay (HOSPITAL_COMMUNITY): Payer: MEDICAID | Admitting: Anesthesiology

## 2015-04-11 ENCOUNTER — Encounter (HOSPITAL_COMMUNITY)
Admission: EM | Disposition: A | Discharge status: Home Health | Payer: Self-pay | Source: Home / Self Care | Attending: Cardiothoracic Surgery

## 2015-04-11 ENCOUNTER — Encounter (HOSPITAL_COMMUNITY): Payer: Self-pay | Admitting: Certified Registered Nurse Anesthetist

## 2015-04-11 ENCOUNTER — Inpatient Hospital Stay (HOSPITAL_COMMUNITY): Payer: Self-pay | Admitting: Anesthesiology

## 2015-04-11 DIAGNOSIS — Z951 Presence of aortocoronary bypass graft: Secondary | ICD-10-CM

## 2015-04-11 DIAGNOSIS — I25110 Atherosclerotic heart disease of native coronary artery with unstable angina pectoris: Secondary | ICD-10-CM

## 2015-04-11 HISTORY — PX: TEE WITHOUT CARDIOVERSION: SHX5443

## 2015-04-11 HISTORY — PX: CORONARY ARTERY BYPASS GRAFT: SHX141

## 2015-04-11 LAB — POCT I-STAT, CHEM 8
BUN: 5 mg/dL — ABNORMAL LOW (ref 6–20)
BUN: 4 mg/dL — ABNORMAL LOW (ref 6–20)
BUN: 3 mg/dL — AB (ref 6–20)
BUN: 4 mg/dL — ABNORMAL LOW (ref 6–20)
BUN: 5 mg/dL — AB (ref 6–20)
BUN: 3 mg/dL — ABNORMAL LOW (ref 6–20)
BUN: 4 mg/dL — ABNORMAL LOW (ref 6–20)
CALCIUM ION: 1.24 mmol/L — AB (ref 1.12–1.23)
CALCIUM ION: 1.23 mmol/L (ref 1.12–1.23)
CALCIUM ION: 1.24 mmol/L — AB (ref 1.12–1.23)
CALCIUM ION: 1.04 mmol/L — AB (ref 1.12–1.23)
CALCIUM ION: 1.18 mmol/L (ref 1.12–1.23)
CHLORIDE: 104 mmol/L (ref 101–111)
CHLORIDE: 104 mmol/L (ref 101–111)
CHLORIDE: 99 mmol/L — AB (ref 101–111)
CHLORIDE: 106 mmol/L (ref 101–111)
CREATININE: 0.4 mg/dL — AB (ref 0.44–1.00)
CREATININE: 0.3 mg/dL — AB (ref 0.44–1.00)
CREATININE: 0.4 mg/dL — AB (ref 0.44–1.00)
CREATININE: 0.6 mg/dL (ref 0.44–1.00)
Calcium, Ion: 0.98 mmol/L — ABNORMAL LOW (ref 1.12–1.23)
Calcium, Ion: 1.07 mmol/L — ABNORMAL LOW (ref 1.12–1.23)
Chloride: 103 mmol/L (ref 101–111)
Chloride: 103 mmol/L (ref 101–111)
Chloride: 98 mmol/L — ABNORMAL LOW (ref 101–111)
Creatinine, Ser: 0.6 mg/dL (ref 0.44–1.00)
Creatinine, Ser: 0.4 mg/dL — ABNORMAL LOW (ref 0.44–1.00)
Creatinine, Ser: 0.5 mg/dL (ref 0.44–1.00)
GLUCOSE: 156 mg/dL — AB (ref 65–99)
GLUCOSE: 151 mg/dL — AB (ref 65–99)
GLUCOSE: 117 mg/dL — AB (ref 65–99)
GLUCOSE: 139 mg/dL — AB (ref 65–99)
GLUCOSE: 121 mg/dL — AB (ref 65–99)
Glucose, Bld: 117 mg/dL — ABNORMAL HIGH (ref 65–99)
Glucose, Bld: 141 mg/dL — ABNORMAL HIGH (ref 65–99)
HCT: 40 % (ref 36.0–46.0)
HCT: 35 % — ABNORMAL LOW (ref 36.0–46.0)
HCT: 31 % — ABNORMAL LOW (ref 36.0–46.0)
HCT: 26 % — ABNORMAL LOW (ref 36.0–46.0)
HCT: 37 % (ref 36.0–46.0)
HEMATOCRIT: 28 % — AB (ref 36.0–46.0)
HEMATOCRIT: 36 % (ref 36.0–46.0)
HEMOGLOBIN: 13.6 g/dL (ref 12.0–15.0)
HEMOGLOBIN: 10.5 g/dL — AB (ref 12.0–15.0)
HEMOGLOBIN: 8.8 g/dL — AB (ref 12.0–15.0)
Hemoglobin: 11.9 g/dL — ABNORMAL LOW (ref 12.0–15.0)
Hemoglobin: 12.2 g/dL (ref 12.0–15.0)
Hemoglobin: 9.5 g/dL — ABNORMAL LOW (ref 12.0–15.0)
Hemoglobin: 12.6 g/dL (ref 12.0–15.0)
POTASSIUM: 4.3 mmol/L (ref 3.5–5.1)
POTASSIUM: 4.2 mmol/L (ref 3.5–5.1)
POTASSIUM: 4.5 mmol/L (ref 3.5–5.1)
Potassium: 4.2 mmol/L (ref 3.5–5.1)
Potassium: 4.2 mmol/L (ref 3.5–5.1)
Potassium: 5.1 mmol/L (ref 3.5–5.1)
Potassium: 4.5 mmol/L (ref 3.5–5.1)
SODIUM: 141 mmol/L (ref 135–145)
SODIUM: 140 mmol/L (ref 135–145)
Sodium: 139 mmol/L (ref 135–145)
Sodium: 138 mmol/L (ref 135–145)
Sodium: 139 mmol/L (ref 135–145)
Sodium: 138 mmol/L (ref 135–145)
Sodium: 141 mmol/L (ref 135–145)
TCO2: 25 mmol/L (ref 0–100)
TCO2: 25 mmol/L (ref 0–100)
TCO2: 24 mmol/L (ref 0–100)
TCO2: 25 mmol/L (ref 0–100)
TCO2: 25 mmol/L (ref 0–100)
TCO2: 23 mmol/L (ref 0–100)
TCO2: 22 mmol/L (ref 0–100)

## 2015-04-11 LAB — HEMOGLOBIN AND HEMATOCRIT, BLOOD
HCT: 28.3 % — ABNORMAL LOW (ref 36.0–46.0)
Hemoglobin: 9.6 g/dL — ABNORMAL LOW (ref 12.0–15.0)

## 2015-04-11 LAB — CBC
HCT: 35.7 % — ABNORMAL LOW (ref 36.0–46.0)
HEMATOCRIT: 40.1 % (ref 36.0–46.0)
HEMOGLOBIN: 13.7 g/dL (ref 12.0–15.0)
HEMOGLOBIN: 11.8 g/dL — AB (ref 12.0–15.0)
Hemoglobin: 12.3 g/dL (ref 12.0–15.0)
MCH: 28.9 pg (ref 26.0–34.0)
MCH: 30.1 pg (ref 26.0–34.0)
MCH: 28.9 pg (ref 26.0–34.0)
MCHC: 34.2 g/dL (ref 30.0–36.0)
MCHC: 35.6 g/dL (ref 30.0–36.0)
MCHC: 34.5 g/dL (ref 30.0–36.0)
MCV: 84.6 fL (ref 78.0–100.0)
MCV: 84.4 fL (ref 78.0–100.0)
MCV: 84 fL (ref 78.0–100.0)
Platelets: 233 10*3/uL (ref 150–400)
Platelets: 163 10*3/uL (ref 150–400)
Platelets: 206 10*3/uL (ref 150–400)
RBC: 4.74 MIL/uL (ref 3.87–5.11)
RBC: 3.92 MIL/uL (ref 3.87–5.11)
RBC: 4.25 MIL/uL (ref 3.87–5.11)
RDW: 12.7 % (ref 11.5–15.5)
RDW: 12.8 % (ref 11.5–15.5)
RDW: 12.9 % (ref 11.5–15.5)
WBC: 6.1 10*3/uL (ref 4.0–10.5)
WBC: 7.8 10*3/uL (ref 4.0–10.5)
WBC: 9.9 10*3/uL (ref 4.0–10.5)

## 2015-04-11 LAB — POCT I-STAT 3, ART BLOOD GAS (G3+)
ACID-BASE DEFICIT: 1 mmol/L (ref 0.0–2.0)
ACID-BASE DEFICIT: 3 mmol/L — AB (ref 0.0–2.0)
Acid-base deficit: 3 mmol/L — ABNORMAL HIGH (ref 0.0–2.0)
Acid-base deficit: 3 mmol/L — ABNORMAL HIGH (ref 0.0–2.0)
BICARBONATE: 25.3 meq/L — AB (ref 20.0–24.0)
BICARBONATE: 23.7 meq/L (ref 20.0–24.0)
BICARBONATE: 23.7 meq/L (ref 20.0–24.0)
BICARBONATE: 22.9 meq/L (ref 20.0–24.0)
Bicarbonate: 24.1 mEq/L — ABNORMAL HIGH (ref 20.0–24.0)
O2 SAT: 100 %
O2 SAT: 96 %
O2 SAT: 96 %
O2 Saturation: 99 %
O2 Saturation: 98 %
PCO2 ART: 47.3 mmHg — AB (ref 35.0–45.0)
PCO2 ART: 45.2 mmHg — AB (ref 35.0–45.0)
PH ART: 7.366 (ref 7.350–7.450)
PH ART: 7.313 — AB (ref 7.350–7.450)
PO2 ART: 289 mmHg — AB (ref 80.0–100.0)
PO2 ART: 95 mmHg (ref 80.0–100.0)
Patient temperature: 35.4
Patient temperature: 37.7
Patient temperature: 37.9
TCO2: 27 mmol/L (ref 0–100)
TCO2: 25 mmol/L (ref 0–100)
TCO2: 25 mmol/L (ref 0–100)
TCO2: 25 mmol/L (ref 0–100)
TCO2: 24 mmol/L (ref 0–100)
pCO2 arterial: 45.2 mmHg — ABNORMAL HIGH (ref 35.0–45.0)
pCO2 arterial: 42.1 mmHg (ref 35.0–45.0)
pCO2 arterial: 45.9 mmHg — ABNORMAL HIGH (ref 35.0–45.0)
pH, Arterial: 7.355 (ref 7.350–7.450)
pH, Arterial: 7.31 — ABNORMAL LOW (ref 7.350–7.450)
pH, Arterial: 7.317 — ABNORMAL LOW (ref 7.350–7.450)
pO2, Arterial: 158 mmHg — ABNORMAL HIGH (ref 80.0–100.0)
pO2, Arterial: 108 mmHg — ABNORMAL HIGH (ref 80.0–100.0)
pO2, Arterial: 94 mmHg (ref 80.0–100.0)

## 2015-04-11 LAB — CREATININE, SERUM
Creatinine, Ser: 0.68 mg/dL (ref 0.44–1.00)
GFR calc Af Amer: >60 mL/min (ref >60)
GFR calc non Af Amer: >60 mL/min (ref >60)

## 2015-04-11 LAB — GLUCOSE, CAPILLARY
GLUCOSE-CAPILLARY: 109 mg/dL — AB (ref 65–99)
GLUCOSE-CAPILLARY: 125 mg/dL — AB (ref 65–99)
GLUCOSE-CAPILLARY: 111 mg/dL — AB (ref 65–99)
GLUCOSE-CAPILLARY: 118 mg/dL — AB (ref 65–99)
GLUCOSE-CAPILLARY: 142 mg/dL — AB (ref 65–99)
GLUCOSE-CAPILLARY: 121 mg/dL — AB (ref 65–99)
GLUCOSE-CAPILLARY: 107 mg/dL — AB (ref 65–99)
Glucose-Capillary: 113 mg/dL — ABNORMAL HIGH (ref 65–99)
Glucose-Capillary: 116 mg/dL — ABNORMAL HIGH (ref 65–99)
Glucose-Capillary: 132 mg/dL — ABNORMAL HIGH (ref 65–99)

## 2015-04-11 LAB — APTT: APTT: 29 s (ref 24–37)

## 2015-04-11 LAB — PLATELET COUNT: Platelets: 204 10*3/uL (ref 150–400)

## 2015-04-11 LAB — BASIC METABOLIC PANEL
ANION GAP: 9 (ref 5–15)
BUN: 6 mg/dL (ref 6–20)
CHLORIDE: 106 mmol/L (ref 101–111)
CO2: 23 mmol/L (ref 22–32)
Calcium: 9.2 mg/dL (ref 8.9–10.3)
Creatinine, Ser: 0.66 mg/dL (ref 0.44–1.00)
GFR calc Af Amer: >60 mL/min (ref >60)
GFR calc non Af Amer: >60 mL/min (ref >60)
GLUCOSE: 156 mg/dL — AB (ref 65–99)
POTASSIUM: 4.2 mmol/L (ref 3.5–5.1)
Sodium: 138 mmol/L (ref 135–145)

## 2015-04-11 LAB — POCT I-STAT 4, (NA,K, GLUC, HGB,HCT)
GLUCOSE: 114 mg/dL — AB (ref 65–99)
HCT: 32 % — ABNORMAL LOW (ref 36.0–46.0)
Hemoglobin: 10.9 g/dL — ABNORMAL LOW (ref 12.0–15.0)
Potassium: 3.7 mmol/L (ref 3.5–5.1)
Sodium: 141 mmol/L (ref 135–145)

## 2015-04-11 LAB — PROTIME-INR
INR: 1.31 (ref 0.00–1.49)
Prothrombin Time: 16.4 seconds — ABNORMAL HIGH (ref 11.6–15.2)

## 2015-04-11 LAB — MAGNESIUM: Magnesium: 2.5 mg/dL — ABNORMAL HIGH (ref 1.7–2.4)

## 2015-04-11 LAB — HEPARIN LEVEL (UNFRACTIONATED): HEPARIN UNFRACTIONATED: 0.66 [IU]/mL (ref 0.30–0.70)

## 2015-04-11 SURGERY — CORONARY ARTERY BYPASS GRAFTING (CABG)
Anesthesia: General | Site: Chest

## 2015-04-11 MED ORDER — PROTAMINE SULFATE 10 MG/ML IV SOLN
INTRAVENOUS | Status: DC | PRN
  Administered 2015-04-11: 20 mg via INTRAVENOUS
  Administered 2015-04-11: 30 mg via INTRAVENOUS
  Administered 2015-04-11 (×2): 50 mg via INTRAVENOUS
  Administered 2015-04-11: 70 mg via INTRAVENOUS

## 2015-04-11 MED ORDER — ARTIFICIAL TEARS OP OINT
TOPICAL_OINTMENT | OPHTHALMIC | Status: AC
  Filled 2015-04-11: qty 3.5

## 2015-04-11 MED ORDER — 0.9 % SODIUM CHLORIDE (POUR BTL) OPTIME
TOPICAL | Status: DC | PRN
  Administered 2015-04-11: 6000 mL

## 2015-04-11 MED ORDER — MAGNESIUM SULFATE 4 GM/100ML IV SOLN
4.0000 g | Freq: Once | INTRAVENOUS | Status: AC
  Administered 2015-04-11: 4 g via INTRAVENOUS
  Filled 2015-04-11: qty 100

## 2015-04-11 MED ORDER — HEPARIN SODIUM (PORCINE) 1000 UNIT/ML IJ SOLN
INTRAMUSCULAR | Status: DC | PRN
  Administered 2015-04-11: 25000 [IU] via INTRAVENOUS
  Administered 2015-04-11: 3000 [IU] via INTRAVENOUS

## 2015-04-11 MED ORDER — NITROGLYCERIN IN D5W 200-5 MCG/ML-% IV SOLN
0.0000 ug/min | INTRAVENOUS | Status: DC
  Administered 2015-04-12: 100 ug/min via INTRAVENOUS
  Administered 2015-04-12: 55 ug/min via INTRAVENOUS
  Administered 2015-04-12: 50 ug/min via INTRAVENOUS
  Administered 2015-04-13: 15 ug/min via INTRAVENOUS
  Administered 2015-04-13: 5 ug/min via INTRAVENOUS
  Administered 2015-04-13: 20 ug/min via INTRAVENOUS
  Filled 2015-04-11 (×2): qty 250

## 2015-04-11 MED ORDER — VANCOMYCIN HCL IN DEXTROSE 1-5 GM/200ML-% IV SOLN
1000.0000 mg | Freq: Once | INTRAVENOUS | Status: AC
  Administered 2015-04-11: 1000 mg via INTRAVENOUS
  Filled 2015-04-11: qty 200

## 2015-04-11 MED ORDER — PROPOFOL 10 MG/ML IV BOLUS
INTRAVENOUS | Status: AC
  Filled 2015-04-11: qty 20

## 2015-04-11 MED ORDER — ACETAMINOPHEN 160 MG/5ML PO SOLN: 1000.0000 mg | Freq: Four times a day (QID) | ORAL | Status: DC

## 2015-04-11 MED ORDER — EPHEDRINE SULFATE 50 MG/ML IJ SOLN
INTRAMUSCULAR | Status: AC
  Filled 2015-04-11: qty 1

## 2015-04-11 MED ORDER — ARTIFICIAL TEARS OP OINT
TOPICAL_OINTMENT | OPHTHALMIC | Status: DC | PRN
  Administered 2015-04-11: 1 via OPHTHALMIC

## 2015-04-11 MED ORDER — GELATIN ABSORBABLE MT POWD
OROMUCOSAL | Status: DC | PRN
  Administered 2015-04-11 (×3): 4 mL via TOPICAL

## 2015-04-11 MED ORDER — PHENYLEPHRINE HCL 10 MG/ML IJ SOLN
20.0000 mg | INTRAVENOUS | Status: DC | PRN
  Administered 2015-04-11: 20 ug/min via INTRAVENOUS

## 2015-04-11 MED ORDER — ANTISEPTIC ORAL RINSE SOLUTION (CORINZ)
7.0000 mL | Freq: Four times a day (QID) | OROMUCOSAL | Status: DC
  Administered 2015-04-11 – 2015-04-12 (×3): 7 mL via OROMUCOSAL

## 2015-04-11 MED ORDER — PHENYLEPHRINE HCL 10 MG/ML IJ SOLN
10.0000 mg | INTRAVENOUS | Status: DC | PRN
  Administered 2015-04-11: 20 ug/min via INTRAVENOUS

## 2015-04-11 MED ORDER — GLYCOPYRROLATE 0.2 MG/ML IJ SOLN
INTRAMUSCULAR | Status: AC
  Filled 2015-04-11: qty 1

## 2015-04-11 MED ORDER — LACTATED RINGERS IV SOLN
INTRAVENOUS | Status: DC | PRN
  Administered 2015-04-11: 07:00:00 via INTRAVENOUS

## 2015-04-11 MED ORDER — FENTANYL CITRATE (PF) 250 MCG/5ML IJ SOLN
INTRAMUSCULAR | Status: AC
  Filled 2015-04-11: qty 5

## 2015-04-11 MED ORDER — ACETAMINOPHEN 500 MG PO TABS
1000.0000 mg | ORAL_TABLET | Freq: Four times a day (QID) | ORAL | Status: DC
  Administered 2015-04-12 – 2015-04-15 (×14): 1000 mg via ORAL
  Filled 2015-04-11 (×13): qty 2

## 2015-04-11 MED ORDER — ROCURONIUM BROMIDE 100 MG/10ML IV SOLN
INTRAVENOUS | Status: DC | PRN
  Administered 2015-04-11: 50 mg via INTRAVENOUS

## 2015-04-11 MED ORDER — SODIUM CHLORIDE 0.9 % IV SOLN: INTRAVENOUS | Status: DC

## 2015-04-11 MED ORDER — SODIUM CHLORIDE 0.9% FLUSH
3.0000 mL | Freq: Two times a day (BID) | INTRAVENOUS | Status: DC
Start: 2015-04-12 — End: 2015-04-13
  Administered 2015-04-12 (×2): 3 mL via INTRAVENOUS

## 2015-04-11 MED ORDER — OXYCODONE HCL 5 MG PO TABS
5.0000 mg | ORAL_TABLET | ORAL | Status: DC | PRN
  Administered 2015-04-11 – 2015-04-13 (×6): 10 mg via ORAL
  Administered 2015-04-13 – 2015-04-15 (×5): 5 mg via ORAL
  Filled 2015-04-11 (×2): qty 1
  Filled 2015-04-11: qty 2
  Filled 2015-04-11: qty 1
  Filled 2015-04-11 (×3): qty 2
  Filled 2015-04-11: qty 1
  Filled 2015-04-11 (×2): qty 2
  Filled 2015-04-11: qty 1

## 2015-04-11 MED ORDER — SODIUM CHLORIDE 0.9 % IV SOLN
INTRAVENOUS | Status: DC | PRN
  Administered 2015-04-11: 12:00:00 via INTRAVENOUS

## 2015-04-11 MED ORDER — POTASSIUM CHLORIDE 10 MEQ/50ML IV SOLN
10.0000 meq | INTRAVENOUS | Status: AC
  Administered 2015-04-11 (×3): 10 meq via INTRAVENOUS

## 2015-04-11 MED ORDER — SODIUM CHLORIDE 0.9 % IV SOLN
INTRAVENOUS | Status: DC
  Administered 2015-04-11: 18:00:00 via INTRAVENOUS
  Filled 2015-04-11 (×2): qty 2.5

## 2015-04-11 MED ORDER — METOPROLOL TARTRATE 12.5 MG HALF TABLET
12.5000 mg | ORAL_TABLET | Freq: Two times a day (BID) | ORAL | Status: DC
  Administered 2015-04-11: 12.5 mg via ORAL
  Filled 2015-04-11: qty 1

## 2015-04-11 MED ORDER — METOPROLOL TARTRATE 1 MG/ML IV SOLN
2.5000 mg | INTRAVENOUS | Status: DC | PRN
  Administered 2015-04-12 (×2): 5 mg via INTRAVENOUS
  Filled 2015-04-11 (×2): qty 5

## 2015-04-11 MED ORDER — BISACODYL 10 MG RE SUPP
10.0000 mg | Freq: Every day | RECTAL | Status: DC
  Filled 2015-04-11: qty 1

## 2015-04-11 MED ORDER — ALBUMIN HUMAN 5 % IV SOLN: 250.0000 mL | INTRAVENOUS | Status: AC | PRN

## 2015-04-11 MED ORDER — CHLORHEXIDINE GLUCONATE 0.12% ORAL RINSE (MEDLINE KIT)
15.0000 mL | Freq: Two times a day (BID) | OROMUCOSAL | Status: DC
  Administered 2015-04-11 – 2015-04-12 (×2): 15 mL via OROMUCOSAL

## 2015-04-11 MED ORDER — VECURONIUM BROMIDE 10 MG IV SOLR
INTRAVENOUS | Status: AC
  Filled 2015-04-11: qty 10

## 2015-04-11 MED ORDER — PANTOPRAZOLE SODIUM 40 MG PO TBEC
40.0000 mg | DELAYED_RELEASE_TABLET | Freq: Every day | ORAL | Status: DC
  Administered 2015-04-13 – 2015-04-15 (×3): 40 mg via ORAL
  Filled 2015-04-11 (×3): qty 1

## 2015-04-11 MED ORDER — VECURONIUM BROMIDE 10 MG IV SOLR
INTRAVENOUS | Status: DC | PRN
  Administered 2015-04-11 (×3): 10 mg via INTRAVENOUS

## 2015-04-11 MED ORDER — ACETAMINOPHEN 160 MG/5ML PO SOLN: 650.0000 mg | Freq: Once | ORAL | Status: AC

## 2015-04-11 MED ORDER — DOCUSATE SODIUM 100 MG PO CAPS
200.0000 mg | ORAL_CAPSULE | Freq: Every day | ORAL | Status: DC
  Administered 2015-04-12 – 2015-04-15 (×4): 200 mg via ORAL
  Filled 2015-04-11 (×4): qty 2

## 2015-04-11 MED ORDER — DEXTROSE 5 % IV SOLN
1.5000 g | Freq: Two times a day (BID) | INTRAVENOUS | Status: AC
  Administered 2015-04-11 – 2015-04-13 (×4): 1.5 g via INTRAVENOUS
  Filled 2015-04-11 (×4): qty 1.5

## 2015-04-11 MED ORDER — ONDANSETRON HCL 4 MG/2ML IJ SOLN: 4.0000 mg | Freq: Four times a day (QID) | INTRAMUSCULAR | Status: DC | PRN

## 2015-04-11 MED ORDER — SODIUM CHLORIDE 0.45 % IV SOLN
INTRAVENOUS | Status: DC | PRN
  Administered 2015-04-11: 12:00:00 via INTRAVENOUS

## 2015-04-11 MED ORDER — LACTATED RINGERS IV SOLN: INTRAVENOUS | Status: DC

## 2015-04-11 MED ORDER — DOPAMINE-DEXTROSE 3.2-5 MG/ML-% IV SOLN: 0.0000 ug/kg/min | INTRAVENOUS | Status: DC

## 2015-04-11 MED ORDER — TRAMADOL HCL 50 MG PO TABS: 50.0000 mg | ORAL_TABLET | ORAL | Status: DC | PRN

## 2015-04-11 MED ORDER — METOCLOPRAMIDE HCL 5 MG/ML IJ SOLN
10.0000 mg | Freq: Four times a day (QID) | INTRAMUSCULAR | Status: AC
  Administered 2015-04-11 – 2015-04-12 (×4): 10 mg via INTRAVENOUS
  Filled 2015-04-11 (×4): qty 2

## 2015-04-11 MED ORDER — SODIUM CHLORIDE 0.9% FLUSH: 3.0000 mL | INTRAVENOUS | Status: DC | PRN

## 2015-04-11 MED ORDER — ACETAMINOPHEN 650 MG RE SUPP
650.0000 mg | Freq: Once | RECTAL | Status: AC
  Administered 2015-04-11: 650 mg via RECTAL

## 2015-04-11 MED ORDER — ASPIRIN 81 MG PO CHEW
324.0000 mg | CHEWABLE_TABLET | Freq: Every day | ORAL | Status: DC
  Administered 2015-04-15: 324 mg
  Filled 2015-04-11: qty 4

## 2015-04-11 MED ORDER — HEMOSTATIC AGENTS (NO CHARGE) OPTIME
TOPICAL | Status: DC | PRN
  Administered 2015-04-11: 1 via TOPICAL

## 2015-04-11 MED ORDER — MIDAZOLAM HCL 10 MG/2ML IJ SOLN
INTRAMUSCULAR | Status: AC
  Filled 2015-04-11: qty 2

## 2015-04-11 MED ORDER — CHLORHEXIDINE GLUCONATE 0.12 % MT SOLN
15.0000 mL | OROMUCOSAL | Status: AC
  Administered 2015-04-11: 15 mL via OROMUCOSAL
  Filled 2015-04-11: qty 15

## 2015-04-11 MED ORDER — MIDAZOLAM HCL 2 MG/2ML IJ SOLN: 2.0000 mg | INTRAMUSCULAR | Status: DC | PRN

## 2015-04-11 MED ORDER — DEXMEDETOMIDINE HCL IN NACL 200 MCG/50ML IV SOLN
0.0000 ug/kg/h | INTRAVENOUS | Status: DC
  Filled 2015-04-11: qty 50

## 2015-04-11 MED ORDER — PROPOFOL 10 MG/ML IV BOLUS
INTRAVENOUS | Status: DC | PRN
  Administered 2015-04-11: 20 mg via INTRAVENOUS
  Administered 2015-04-11: 50 mg via INTRAVENOUS
  Administered 2015-04-11: 30 mg via INTRAVENOUS

## 2015-04-11 MED ORDER — HEPARIN SODIUM (PORCINE) 1000 UNIT/ML IJ SOLN
INTRAMUSCULAR | Status: AC
  Filled 2015-04-11: qty 1

## 2015-04-11 MED ORDER — LACTATED RINGERS IV SOLN: 500.0000 mL | Freq: Once | INTRAVENOUS | Status: DC | PRN

## 2015-04-11 MED ORDER — SODIUM CHLORIDE 0.9 % IV SOLN: 250.0000 mL | INTRAVENOUS | Status: DC

## 2015-04-11 MED ORDER — INSULIN REGULAR BOLUS VIA INFUSION
0.0000 [IU] | Freq: Three times a day (TID) | INTRAVENOUS | Status: DC
  Filled 2015-04-11: qty 10

## 2015-04-11 MED ORDER — FENTANYL CITRATE (PF) 100 MCG/2ML IJ SOLN
INTRAMUSCULAR | Status: DC | PRN
  Administered 2015-04-11: 150 ug via INTRAVENOUS
  Administered 2015-04-11: 100 ug via INTRAVENOUS
  Administered 2015-04-11: 50 ug via INTRAVENOUS
  Administered 2015-04-11 (×2): 150 ug via INTRAVENOUS
  Administered 2015-04-11: 50 ug via INTRAVENOUS
  Administered 2015-04-11: 150 ug via INTRAVENOUS
  Administered 2015-04-11: 100 ug via INTRAVENOUS
  Administered 2015-04-11: 250 ug via INTRAVENOUS
  Administered 2015-04-11 (×2): 100 ug via INTRAVENOUS
  Administered 2015-04-11: 50 ug via INTRAVENOUS
  Administered 2015-04-11: 100 ug via INTRAVENOUS

## 2015-04-11 MED ORDER — FENTANYL CITRATE (PF) 250 MCG/5ML IJ SOLN
INTRAMUSCULAR | Status: AC
  Filled 2015-04-11: qty 20

## 2015-04-11 MED ORDER — BISACODYL 5 MG PO TBEC
10.0000 mg | DELAYED_RELEASE_TABLET | Freq: Every day | ORAL | Status: DC
  Administered 2015-04-12 – 2015-04-15 (×4): 10 mg via ORAL
  Filled 2015-04-11 (×4): qty 2

## 2015-04-11 MED ORDER — STERILE WATER FOR INJECTION IJ SOLN
INTRAMUSCULAR | Status: AC
  Filled 2015-04-11: qty 10

## 2015-04-11 MED ORDER — MORPHINE SULFATE (PF) 2 MG/ML IV SOLN
1.0000 mg | INTRAVENOUS | Status: AC | PRN
  Administered 2015-04-11: 2 mg via INTRAVENOUS

## 2015-04-11 MED ORDER — MIDAZOLAM HCL 5 MG/5ML IJ SOLN
INTRAMUSCULAR | Status: DC | PRN
  Administered 2015-04-11: 1 mg via INTRAVENOUS
  Administered 2015-04-11: 3 mg via INTRAVENOUS
  Administered 2015-04-11: 2 mg via INTRAVENOUS
  Administered 2015-04-11: 4 mg via INTRAVENOUS

## 2015-04-11 MED ORDER — MORPHINE SULFATE (PF) 2 MG/ML IV SOLN
2.0000 mg | INTRAVENOUS | Status: DC | PRN
  Administered 2015-04-11 – 2015-04-12 (×5): 2 mg via INTRAVENOUS
  Filled 2015-04-11 (×6): qty 1

## 2015-04-11 MED ORDER — PHENYLEPHRINE HCL 10 MG/ML IJ SOLN
INTRAMUSCULAR | Status: DC | PRN
  Administered 2015-04-11: 80 ug via INTRAVENOUS

## 2015-04-11 MED ORDER — METOPROLOL TARTRATE 25 MG/10 ML ORAL SUSPENSION: 12.5000 mg | Freq: Two times a day (BID) | ORAL | Status: DC

## 2015-04-11 MED ORDER — PROTAMINE SULFATE 10 MG/ML IV SOLN
INTRAVENOUS | Status: AC
  Filled 2015-04-11: qty 25

## 2015-04-11 MED ORDER — FAMOTIDINE IN NACL 20-0.9 MG/50ML-% IV SOLN
20.0000 mg | Freq: Two times a day (BID) | INTRAVENOUS | Status: AC
  Administered 2015-04-11: 20 mg via INTRAVENOUS

## 2015-04-11 MED ORDER — LACTATED RINGERS IV SOLN
INTRAVENOUS | Status: DC | PRN
  Administered 2015-04-11 (×2): via INTRAVENOUS

## 2015-04-11 MED ORDER — PHENYLEPHRINE HCL 10 MG/ML IJ SOLN
0.0000 ug/min | INTRAVENOUS | Status: DC
  Filled 2015-04-11: qty 2

## 2015-04-11 MED ORDER — ALBUMIN HUMAN 5 % IV SOLN
INTRAVENOUS | Status: DC | PRN
  Administered 2015-04-11: 11:00:00 via INTRAVENOUS

## 2015-04-11 MED ORDER — ROCURONIUM BROMIDE 50 MG/5ML IV SOLN
INTRAVENOUS | Status: AC
  Filled 2015-04-11: qty 1

## 2015-04-11 MED ORDER — ASPIRIN EC 325 MG PO TBEC
325.0000 mg | DELAYED_RELEASE_TABLET | Freq: Every day | ORAL | Status: DC
  Administered 2015-04-12 – 2015-04-14 (×3): 325 mg via ORAL
  Filled 2015-04-11 (×4): qty 1

## 2015-04-11 MED ORDER — SODIUM CHLORIDE 0.9 % IJ SOLN
INTRAMUSCULAR | Status: AC
  Filled 2015-04-11: qty 30

## 2015-04-11 MED FILL — Heparin Sodium (Porcine) Inj 1000 Unit/ML: INTRAMUSCULAR | Qty: 30 | Status: AC

## 2015-04-11 MED FILL — Potassium Chloride Inj 2 mEq/ML: INTRAVENOUS | Qty: 40 | Status: AC

## 2015-04-11 MED FILL — Magnesium Sulfate Inj 50%: INTRAMUSCULAR | Qty: 10 | Status: AC

## 2015-04-11 SURGICAL SUPPLY — 88 items
ADAPTER CARDIO PERF ANTE/RETRO (ADAPTER) ×4 IMPLANT
BAG DECANTER FOR FLEXI CONT (MISCELLANEOUS) ×4 IMPLANT
BANDAGE ELASTIC 4 VELCRO ST LF (GAUZE/BANDAGES/DRESSINGS) ×4 IMPLANT
BANDAGE ELASTIC 6 VELCRO ST LF (GAUZE/BANDAGES/DRESSINGS) ×4 IMPLANT
BASKET HEART  (ORDER IN 25'S) (MISCELLANEOUS) ×1
BASKET HEART (ORDER IN 25'S) (MISCELLANEOUS) ×1
BASKET HEART (ORDER IN 25S) (MISCELLANEOUS) ×2 IMPLANT
BLADE STERNUM SYSTEM 6 (BLADE) ×4 IMPLANT
BLADE SURG 12 STRL SS (BLADE) ×4 IMPLANT
BLADE SURG ROTATE 9660 (MISCELLANEOUS) IMPLANT
BNDG GAUZE ELAST 4 BULKY (GAUZE/BANDAGES/DRESSINGS) ×4 IMPLANT
CANISTER SUCTION 2500CC (MISCELLANEOUS) ×4 IMPLANT
CANNULA GUNDRY RCSP 15FR (MISCELLANEOUS) ×4 IMPLANT
CATH CPB KIT VANTRIGT (MISCELLANEOUS) ×4 IMPLANT
CATH ROBINSON RED A/P 18FR (CATHETERS) ×12 IMPLANT
CATH THORACIC 36FR RT ANG (CATHETERS) ×4 IMPLANT
COVER SURGICAL LIGHT HANDLE (MISCELLANEOUS) ×4 IMPLANT
CRADLE DONUT ADULT HEAD (MISCELLANEOUS) ×4 IMPLANT
DRAIN CHANNEL 32F RND 10.7 FF (WOUND CARE) ×4 IMPLANT
DRAPE CARDIOVASCULAR INCISE (DRAPES) ×2
DRAPE SLUSH/WARMER DISC (DRAPES) ×4 IMPLANT
DRAPE SRG 135X102X78XABS (DRAPES) ×2 IMPLANT
DRSG AQUACEL AG ADV 3.5X14 (GAUZE/BANDAGES/DRESSINGS) ×4 IMPLANT
ELECT BLADE 4.0 EZ CLEAN MEGAD (MISCELLANEOUS) ×4
ELECT BLADE 6.5 EXT (BLADE) ×4 IMPLANT
ELECT CAUTERY BLADE 6.4 (BLADE) ×4 IMPLANT
ELECT REM PT RETURN 9FT ADLT (ELECTROSURGICAL) ×8
ELECTRODE BLDE 4.0 EZ CLN MEGD (MISCELLANEOUS) ×2 IMPLANT
ELECTRODE REM PT RTRN 9FT ADLT (ELECTROSURGICAL) ×4 IMPLANT
FELT TEFLON 1X6 (MISCELLANEOUS) ×4 IMPLANT
GAUZE SPONGE 4X4 12PLY STRL (GAUZE/BANDAGES/DRESSINGS) ×8 IMPLANT
GLOVE BIO SURGEON STRL SZ7.5 (GLOVE) ×12 IMPLANT
GOWN STRL REUS W/ TWL LRG LVL3 (GOWN DISPOSABLE) ×8 IMPLANT
GOWN STRL REUS W/TWL LRG LVL3 (GOWN DISPOSABLE) ×8
HEMOSTAT POWDER SURGIFOAM 1G (HEMOSTASIS) ×12 IMPLANT
HEMOSTAT SURGICEL 2X14 (HEMOSTASIS) ×4 IMPLANT
INSERT FOGARTY XLG (MISCELLANEOUS) IMPLANT
KIT BASIN OR (CUSTOM PROCEDURE TRAY) ×4 IMPLANT
KIT ROOM TURNOVER OR (KITS) ×4 IMPLANT
KIT SUCTION CATH 14FR (SUCTIONS) ×4 IMPLANT
KIT VASOVIEW 6 PRO VH 2400 (KITS) ×4 IMPLANT
LEAD PACING MYOCARDI (MISCELLANEOUS) ×4 IMPLANT
MARKER GRAFT CORONARY BYPASS (MISCELLANEOUS) ×12 IMPLANT
NS IRRIG 1000ML POUR BTL (IV SOLUTION) ×20 IMPLANT
PACK OPEN HEART (CUSTOM PROCEDURE TRAY) ×4 IMPLANT
PAD ARMBOARD 7.5X6 YLW CONV (MISCELLANEOUS) ×8 IMPLANT
PAD ELECT DEFIB RADIOL ZOLL (MISCELLANEOUS) ×4 IMPLANT
PENCIL BUTTON HOLSTER BLD 10FT (ELECTRODE) ×4 IMPLANT
PUNCH AORTIC ROTATE 4.0MM (MISCELLANEOUS) IMPLANT
PUNCH AORTIC ROTATE 4.5MM 8IN (MISCELLANEOUS) IMPLANT
PUNCH AORTIC ROTATE 5MM 8IN (MISCELLANEOUS) IMPLANT
SET CARDIOPLEGIA MPS 5001102 (MISCELLANEOUS) ×4 IMPLANT
SPONGE GAUZE 4X4 12PLY STER LF (GAUZE/BANDAGES/DRESSINGS) ×4 IMPLANT
SURGIFLO W/THROMBIN 8M KIT (HEMOSTASIS) ×4 IMPLANT
SUT BONE WAX W31G (SUTURE) ×4 IMPLANT
SUT MNCRL AB 4-0 PS2 18 (SUTURE) IMPLANT
SUT PROLENE 3 0 SH DA (SUTURE) IMPLANT
SUT PROLENE 3 0 SH1 36 (SUTURE) IMPLANT
SUT PROLENE 4 0 RB 1 (SUTURE) ×4
SUT PROLENE 4 0 SH DA (SUTURE) ×8 IMPLANT
SUT PROLENE 4-0 RB1 .5 CRCL 36 (SUTURE) ×4 IMPLANT
SUT PROLENE 5 0 C 1 36 (SUTURE) IMPLANT
SUT PROLENE 6 0 C 1 30 (SUTURE) IMPLANT
SUT PROLENE 6 0 CC (SUTURE) ×12 IMPLANT
SUT PROLENE 8 0 BV175 6 (SUTURE) IMPLANT
SUT PROLENE BLUE 7 0 (SUTURE) ×4 IMPLANT
SUT SILK  1 MH (SUTURE)
SUT SILK 1 MH (SUTURE) IMPLANT
SUT SILK 2 0 SH CR/8 (SUTURE) IMPLANT
SUT SILK 3 0 SH CR/8 (SUTURE) IMPLANT
SUT STEEL 6MS V (SUTURE) ×8 IMPLANT
SUT STEEL SZ 6 DBL 3X14 BALL (SUTURE) ×4 IMPLANT
SUT VIC AB 1 CTX 36 (SUTURE) ×12
SUT VIC AB 1 CTX36XBRD ANBCTR (SUTURE) ×12 IMPLANT
SUT VIC AB 2-0 CT1 27 (SUTURE)
SUT VIC AB 2-0 CT1 TAPERPNT 27 (SUTURE) IMPLANT
SUT VIC AB 2-0 CTX 27 (SUTURE) IMPLANT
SUT VIC AB 3-0 X1 27 (SUTURE) IMPLANT
SUTURE E-PAK OPEN HEART (SUTURE) ×4 IMPLANT
SYSTEM SAHARA CHEST DRAIN ATS (WOUND CARE) ×4 IMPLANT
TAPE CLOTH SURG 4X10 WHT LF (GAUZE/BANDAGES/DRESSINGS) ×4 IMPLANT
TAPE PAPER 2X10 WHT MICROPORE (GAUZE/BANDAGES/DRESSINGS) ×4 IMPLANT
TOWEL OR 17X24 6PK STRL BLUE (TOWEL DISPOSABLE) ×8 IMPLANT
TOWEL OR 17X26 10 PK STRL BLUE (TOWEL DISPOSABLE) ×8 IMPLANT
TRAY FOLEY IC TEMP SENS 16FR (CATHETERS) ×4 IMPLANT
TUBING INSUFFLATION (TUBING) ×4 IMPLANT
UNDERPAD 30X30 INCONTINENT (UNDERPADS AND DIAPERS) ×4 IMPLANT
WATER STERILE IRR 1000ML POUR (IV SOLUTION) ×8 IMPLANT

## 2015-04-11 NOTE — CV Procedure (Signed)
Intra-operative Transesophageal Echocardiography Report:  Jaide Deveaux is a 50 year old female with a history of Type 2 diabetes, smoking, and obesity who was admitted on 04/06/2014  with progressive chest pain and minimally elevated cardiac enzymes. Cardiac catheterization revealed 90-95% ostial LAD stenosis. No other significant coronary artery disease was noted and there was normal left ventricular function and no evidence of valvular heart disease on echocardiogram. This lesion was felt to be not amenable to angioplasty and stenting and she was referred to Dr. Prescott Gum for coronary artery bypass grafting.  Intraoperative transesophageal echocardiography was indicated to evaluate the right and left ventricular function, to serve as a monitor for intraoperative volume status, and to assess for any valvular pathology.  The patient was brought to the operating room at [facility] and general anesthesia was induced without difficulty. Following endotracheal intubation and orogastric suctioning, the transesophageal echocardiography probe was inserted into the esophagus without difficulty.   Pre-bypass Findings:   1. Aortic valve: The aortic valve was trileaflet. The leaflets opened normally without restriction. There was trace aortic insufficiency.  2. Mitral valve: The mitral leaflets appeared to coapt normally without prolapsing or flail segments. There was trace mitral insufficiency. There was mild thickening of the leaflets.   3. Left ventricle: There was normal left ventricular systolic function. There was good contractility in all segments interrogated and the ejection fraction was estimated at 55-60%. There were no regional wall motion abnormalities. Left ventricular size was within normal limits and measured 3.91 cm at end-diastole at the mid-papillary level. The anterior wall measured 1.01 cm in the posterior wall 1.05 cm at end-diastole at the mid-papillary level.   4. Right  ventricle: The right ventricular cavity was of normal size. There was normal right ventricular systolic function.   5. Tricuspid valve: There was 1+ tricuspid insufficiency. The tricuspid leaflets otherwise appeared normal.  6. Interatrial septum: The interatrial septum was intact without evidence of patent foramen ovale or atrial septal defect by color Doppler and bubble study.   7. Left atrium: There was no thrombus noted within the left atrium or left atrial appendage.  8. Ascending aorta: There was a well-defined aortic root and sinotubular ridge without effacement or dilatation. The walls of the ascending aorta were mildly thickened but no protruding atheromata were noted.   9. Descending aorta: The descending aorta appeared free of atheromatous disease and measured 2.1 cm in diameter.   Post bypass Findings:   1. Aortic valve: The aortic valve was unchanged from the pre-bypass study. The leaflets opened normally and there was trace aortic insufficiency.   2. Mitral valve: The mitral valve appeared unchanged from the pre-bypass study. There was trace mitral insufficiency and normal-appearing coaptation of the leaflets.  3. Left ventricle: The left ventricular cavity was of normal size with normal contractility in all segments interrogated and the ejection fraction was estimated at 60%.   4. Right ventricle: The right ventricular cavity was of normal size and there was normal contractility of the right ventricular free wall.   5. Tricuspid valve: The tricuspid valve showed 1+ tricuspid insufficiency which appeared unchanged from the pre-bypass study.   Roberts Gaudy, M.D.

## 2015-04-11 NOTE — Procedures (Signed)
Extubation Procedure Note  Patient Details:   Name: Kimberly [name] DOB: February 12, 1965 MRN: [id number]   Airway Documentation:     Evaluation  O2 sats: stable throughout Complications: No apparent complications Patient did tolerate procedure well. Bilateral Breath Sounds: Rhonchi   Yes   Pt extubated to 4L N/C, no stridor noted, NIF >-20, V/C ~ 1L.  RN at bedside.  Donnetta Hail 04/11/2015, 4:56 PM

## 2015-04-11 NOTE — Progress Notes (Signed)
The patient was examined and preop studies reviewed. There has been no change from the prior exam and the patient is ready for surgery.  Plan CABG on K Tye [place]

## 2015-04-11 NOTE — Progress Notes (Signed)
TCTS BRIEF SICU PROGRESS NOTE  Day of Surgery  S/P Procedure(s) (LRB): CORONARY ARTERY BYPASS GRAFTING (CABG) x one using left internal mamary artery.  (N/A) TRANSESOPHAGEAL ECHOCARDIOGRAM (TEE) (N/A)   Extubated uneventfully NSR w/ stable hemodynamics off all drips Breathing comfortably w/ O2 sats 100% on 4 L/min via  Chest tube output low UOP adequate  Plan: Continue routine early postop  Rexene Alberts, MD 04/11/2015 7:36 PM

## 2015-04-11 NOTE — Brief Op Note (Signed)
04/06/2015 - 04/11/2015  10:28 AM  PATIENT:  Kimberly [name]  50 y.o. female  PRE-OPERATIVE DIAGNOSIS:  CAD  POST-OPERATIVE DIAGNOSIS:  CAD  PROCEDURE:  Procedure(s):  CORONARY ARTERY BYPASS GRAFTING x 1 -LIMA to LAD  TRANSESOPHAGEAL ECHOCARDIOGRAM (TEE) (N/A)  SURGEON:  Surgeon(s) and Role:    * Ivin Poot, MD - Primary  PHYSICIAN ASSISTANT: [name] PA-C  ANESTHESIA:   general  EBL:  Total I/O In: -  Out: 830 [Urine:830]  BLOOD ADMINISTERED:CELLSAVER  DRAINS: Mediastinal Chest Drains   LOCAL MEDICATIONS USED:  NONE  SPECIMEN:  No Specimen  DISPOSITION OF SPECIMEN:  N/A  COUNTS:  YES  TOURNIQUET:  * No tourniquets in log *  DICTATION: .Dragon Dictation  PLAN OF CARE: Admit to inpatient   PATIENT DISPOSITION:  ICU - intubated and hemodynamically stable.   Delay start of Pharmacological VTE agent (>24hrs) due to surgical blood loss or risk of bleeding: yes

## 2015-04-11 NOTE — Anesthesia Postprocedure Evaluation (Signed)
Anesthesia Post Note  Patient: Kimberly [name]  Procedure(s) Performed: Procedure(s) (LRB): CORONARY ARTERY BYPASS GRAFTING (CABG) x one using left internal mamary artery.  (N/A) TRANSESOPHAGEAL ECHOCARDIOGRAM (TEE) (N/A)  Patient location during evaluation: SICU Anesthesia Type: General Level of consciousness: patient remains intubated per anesthesia plan Pain management: pain level controlled Vital Signs Assessment: post-procedure vital signs reviewed and stable Respiratory status: patient on ventilator - see flowsheet for VS and patient remains intubated per anesthesia plan Cardiovascular status: blood pressure returned to baseline Anesthetic complications: no    Last Vitals:  Filed Vitals:   04/11/15 1900 04/11/15 1915  BP: 104/76   Pulse: 101 101  Temp: 37.9 C 37.9 C  Resp: 12 11    Last Pain:  Filed Vitals:   04/11/15 1916  PainSc: 10-Worst pain ever                 Kimberly [name] COKER

## 2015-04-11 NOTE — Anesthesia Procedure Notes (Addendum)
Procedure Name: Intubation Date/Time: 04/11/2015 7:45 AM Performed by: Garrison Columbus T Pre-anesthesia Checklist: Patient identified, Emergency Drugs available, Suction available and Patient being monitored Patient Re-evaluated:Patient Re-evaluated prior to inductionOxygen Delivery Method: Circle System Utilized Preoxygenation: Pre-oxygenation with 100% oxygen Intubation Type: IV induction Ventilation: Mask ventilation without difficulty Laryngoscope Size: Mac and 3 Grade View: Grade II Tube type: Oral Tube size: 8.0 mm Number of attempts: 1 Airway Equipment and Method: Stylet Placement Confirmation: ETT inserted through vocal cords under direct vision,  positive ETCO2 and breath sounds checked- equal and bilateral Secured at: 21 cm Tube secured with: Tape Dental Injury: Teeth and Oropharynx as per pre-operative assessment  Comments: Intubation by [address]

## 2015-04-11 NOTE — Progress Notes (Signed)
Pt taken to OR for CABG this morning and transferred post op to ICU intubated. Pt under care of [facility]. Will sign off. Call for any questions.

## 2015-04-11 NOTE — Transfer of Care (Signed)
Immediate Anesthesia Transfer of Care Note  Patient: Kimberly [name]  Procedure(s) Performed: Procedure(s): CORONARY ARTERY BYPASS GRAFTING (CABG) x one using left internal mamary artery.  (N/A) TRANSESOPHAGEAL ECHOCARDIOGRAM (TEE) (N/A)  Patient Location: SICU  Anesthesia Type:General  Level of Consciousness: sedated and Patient remains intubated per anesthesia plan  Airway & Oxygen Therapy: Patient remains intubated per anesthesia plan and Patient placed on Ventilator (see vital sign flow sheet for setting)  Post-op Assessment: Report given to RN and Post -op Vital signs reviewed and stable  Post vital signs: Reviewed and stable  Last Vitals:  Filed Vitals:   04/11/15 0400 04/11/15 0525  BP: 103/71 142/92  Pulse: 74 72  Temp: 36.6 C   Resp: 15 13    Complications: No apparent anesthesia complications

## 2015-04-11 NOTE — Progress Notes (Signed)
  Echocardiogram Echocardiogram Transesophageal has been performed.  Bobbye Charleston 04/11/2015, 9:49 AM

## 2015-04-12 ENCOUNTER — Encounter (HOSPITAL_COMMUNITY): Payer: Self-pay | Admitting: Cardiothoracic Surgery

## 2015-04-12 ENCOUNTER — Inpatient Hospital Stay (HOSPITAL_COMMUNITY): Payer: Self-pay

## 2015-04-12 LAB — TYPE AND SCREEN
ABO/RH(D): B POS
Antibody Screen: NEGATIVE
Unit division: 0
Unit division: 0

## 2015-04-12 LAB — CBC
HCT: 35.1 % — ABNORMAL LOW (ref 36.0–46.0)
HEMATOCRIT: 32.9 % — AB (ref 36.0–46.0)
Hemoglobin: 11.3 g/dL — ABNORMAL LOW (ref 12.0–15.0)
Hemoglobin: 11.8 g/dL — ABNORMAL LOW (ref 12.0–15.0)
MCH: 29.1 pg (ref 26.0–34.0)
MCH: 28.4 pg (ref 26.0–34.0)
MCHC: 34.3 g/dL (ref 30.0–36.0)
MCHC: 33.6 g/dL (ref 30.0–36.0)
MCV: 84.8 fL (ref 78.0–100.0)
MCV: 84.6 fL (ref 78.0–100.0)
PLATELETS: 199 10*3/uL (ref 150–400)
Platelets: 183 10*3/uL (ref 150–400)
RBC: 3.88 MIL/uL (ref 3.87–5.11)
RBC: 4.15 MIL/uL (ref 3.87–5.11)
RDW: 12.9 % (ref 11.5–15.5)
RDW: 13 % (ref 11.5–15.5)
WBC: 9.8 10*3/uL (ref 4.0–10.5)
WBC: 9.7 10*3/uL (ref 4.0–10.5)

## 2015-04-12 LAB — BASIC METABOLIC PANEL
ANION GAP: 8 (ref 5–15)
BUN: 5 mg/dL — ABNORMAL LOW (ref 6–20)
CALCIUM: 8.4 mg/dL — AB (ref 8.9–10.3)
CO2: 24 mmol/L (ref 22–32)
Chloride: 106 mmol/L (ref 101–111)
Creatinine, Ser: 0.65 mg/dL (ref 0.44–1.00)
GFR calc Af Amer: >60 mL/min (ref >60)
GFR calc non Af Amer: >60 mL/min (ref >60)
GLUCOSE: 110 mg/dL — AB (ref 65–99)
Potassium: 4.3 mmol/L (ref 3.5–5.1)
Sodium: 138 mmol/L (ref 135–145)

## 2015-04-12 LAB — GLUCOSE, CAPILLARY
GLUCOSE-CAPILLARY: 113 mg/dL — AB (ref 65–99)
GLUCOSE-CAPILLARY: 105 mg/dL — AB (ref 65–99)
GLUCOSE-CAPILLARY: 126 mg/dL — AB (ref 65–99)
GLUCOSE-CAPILLARY: 140 mg/dL — AB (ref 65–99)
GLUCOSE-CAPILLARY: 164 mg/dL — AB (ref 65–99)
GLUCOSE-CAPILLARY: 122 mg/dL — AB (ref 65–99)
Glucose-Capillary: 105 mg/dL — ABNORMAL HIGH (ref 65–99)
Glucose-Capillary: 108 mg/dL — ABNORMAL HIGH (ref 65–99)
Glucose-Capillary: 113 mg/dL — ABNORMAL HIGH (ref 65–99)
Glucose-Capillary: 113 mg/dL — ABNORMAL HIGH (ref 65–99)
Glucose-Capillary: 122 mg/dL — ABNORMAL HIGH (ref 65–99)
Glucose-Capillary: 133 mg/dL — ABNORMAL HIGH (ref 65–99)
Glucose-Capillary: 141 mg/dL — ABNORMAL HIGH (ref 65–99)
Glucose-Capillary: 152 mg/dL — ABNORMAL HIGH (ref 65–99)
Glucose-Capillary: 176 mg/dL — ABNORMAL HIGH (ref 65–99)
Glucose-Capillary: 179 mg/dL — ABNORMAL HIGH (ref 65–99)
Glucose-Capillary: 214 mg/dL — ABNORMAL HIGH (ref 65–99)
Glucose-Capillary: 98 mg/dL (ref 65–99)

## 2015-04-12 LAB — CREATININE, SERUM
CREATININE: 0.66 mg/dL (ref 0.44–1.00)
GFR calc Af Amer: >60 mL/min (ref >60)
GFR calc non Af Amer: >60 mL/min (ref >60)

## 2015-04-12 LAB — POCT I-STAT, CHEM 8
BUN: 4 mg/dL — AB (ref 6–20)
BUN: 4 mg/dL — ABNORMAL LOW (ref 6–20)
CALCIUM ION: 1.13 mmol/L (ref 1.12–1.23)
CALCIUM ION: 1.14 mmol/L (ref 1.12–1.23)
CREATININE: 0.6 mg/dL (ref 0.44–1.00)
CREATININE: 0.6 mg/dL (ref 0.44–1.00)
Chloride: 102 mmol/L (ref 101–111)
Chloride: 95 mmol/L — ABNORMAL LOW (ref 101–111)
Glucose, Bld: 115 mg/dL — ABNORMAL HIGH (ref 65–99)
Glucose, Bld: 184 mg/dL — ABNORMAL HIGH (ref 65–99)
HCT: 35 % — ABNORMAL LOW (ref 36.0–46.0)
HEMATOCRIT: 35 % — AB (ref 36.0–46.0)
HEMOGLOBIN: 11.9 g/dL — AB (ref 12.0–15.0)
Hemoglobin: 11.9 g/dL — ABNORMAL LOW (ref 12.0–15.0)
Potassium: 4.1 mmol/L (ref 3.5–5.1)
Potassium: 4.2 mmol/L (ref 3.5–5.1)
Sodium: 134 mmol/L — ABNORMAL LOW (ref 135–145)
Sodium: 139 mmol/L (ref 135–145)
TCO2: 23 mmol/L (ref 0–100)
TCO2: 25 mmol/L (ref 0–100)

## 2015-04-12 LAB — MAGNESIUM
MAGNESIUM: 2 mg/dL (ref 1.7–2.4)
Magnesium: 1.9 mg/dL (ref 1.7–2.4)
Magnesium: 1.6 mg/dL — ABNORMAL LOW (ref 1.7–2.4)

## 2015-04-12 MED ORDER — FUROSEMIDE 10 MG/ML IJ SOLN
20.0000 mg | Freq: Two times a day (BID) | INTRAMUSCULAR | Status: DC
  Administered 2015-04-12 – 2015-04-13 (×3): 20 mg via INTRAVENOUS
  Filled 2015-04-12 (×3): qty 2

## 2015-04-12 MED ORDER — METOPROLOL TARTRATE 25 MG PO TABS
25.0000 mg | ORAL_TABLET | Freq: Two times a day (BID) | ORAL | Status: DC
Start: 2015-04-12 — End: 2015-04-12
  Administered 2015-04-12 (×2): 25 mg via ORAL
  Filled 2015-04-12 (×2): qty 1

## 2015-04-12 MED ORDER — INSULIN DETEMIR 100 UNIT/ML ~~LOC~~ SOLN
12.0000 [IU] | Freq: Two times a day (BID) | SUBCUTANEOUS | Status: DC
  Administered 2015-04-12 – 2015-04-13 (×3): 12 [IU] via SUBCUTANEOUS
  Filled 2015-04-12 (×4): qty 0.12

## 2015-04-12 MED ORDER — INSULIN ASPART 100 UNIT/ML ~~LOC~~ SOLN: 4.0000 [IU] | Freq: Three times a day (TID) | SUBCUTANEOUS | Status: DC

## 2015-04-12 MED ORDER — INSULIN ASPART 100 UNIT/ML ~~LOC~~ SOLN
0.0000 [IU] | SUBCUTANEOUS | Status: DC
  Administered 2015-04-12: 8 [IU] via SUBCUTANEOUS
  Administered 2015-04-12 – 2015-04-13 (×3): 2 [IU] via SUBCUTANEOUS
  Administered 2015-04-13: 4 [IU] via SUBCUTANEOUS

## 2015-04-12 MED ORDER — METOPROLOL TARTRATE 50 MG PO TABS
50.0000 mg | ORAL_TABLET | Freq: Two times a day (BID) | ORAL | Status: DC
  Administered 2015-04-13 – 2015-04-15 (×5): 50 mg via ORAL
  Filled 2015-04-12 (×5): qty 1

## 2015-04-12 MED ORDER — METOCLOPRAMIDE HCL 5 MG/ML IJ SOLN
10.0000 mg | Freq: Four times a day (QID) | INTRAMUSCULAR | Status: AC
  Administered 2015-04-12 – 2015-04-13 (×4): 10 mg via INTRAVENOUS
  Filled 2015-04-12 (×4): qty 2

## 2015-04-12 MED FILL — Electrolyte-R (PH 7.4) Solution: INTRAVENOUS | Qty: 4000 | Status: AC

## 2015-04-12 MED FILL — Sodium Bicarbonate IV Soln 8.4%: INTRAVENOUS | Qty: 50 | Status: AC

## 2015-04-12 MED FILL — Heparin Sodium (Porcine) Inj 1000 Unit/ML: INTRAMUSCULAR | Qty: 10 | Status: AC

## 2015-04-12 MED FILL — Mannitol IV Soln 20%: INTRAVENOUS | Qty: 500 | Status: AC

## 2015-04-12 MED FILL — Lidocaine HCl IV Inj 20 MG/ML: INTRAVENOUS | Qty: 10 | Status: AC

## 2015-04-12 MED FILL — Sodium Chloride IV Soln 0.9%: INTRAVENOUS | Qty: 2000 | Status: AC

## 2015-04-12 NOTE — Progress Notes (Signed)
Chaplain presented to the patient's room to provide information on the [facility]. The patient was sleeping at the time on this visit, but spoke to the Nurse who inquired of the process of placement to the [facility].  Chaplain instructed the Nurse to have the husband of the husband to come to the Spiritual Care Office to complete the paperwork prior to Monday to ensure entrance to the [facility].  The Spiritual Care Office was informed of the same. Chaplain Yaakov Guthrie [phone]

## 2015-04-12 NOTE — Op Note (Signed)
Kimberly [name], [name] NO.:  [id number]  MEDICAL RECORD NO.:  [id number]  LOCATION:  2S11C                        FACILITY:  [facility]  PHYSICIAN:  Ivin Poot, M.D.  DATE OF BIRTH:  10-14-1965  DATE OF PROCEDURE:  04/11/2015 DATE OF DISCHARGE:                              OPERATIVE REPORT   OPERATION:  Coronary artery bypass grafting x1 (left internal mammary artery to left anterior descending).  SURGEON:  Ivin Poot, M.D.  ASSISTANT:  [name], PA-C.  ANESTHESIA:  General by Dr. Roberts Gaudy.  PREOPERATIVE DIAGNOSIS:  Unstable angina, 99% stenosis of ostial left anterior descending artery.  POSTOPERATIVE DIAGNOSIS:  Unstable angina, 99% stenosis of ostial left anterior descending artery.  CLINICAL NOTE:  The patient is an obese, diabetic, smoker 50 year old who presents with symptoms of progressive chest pain with increasing intensity and frequency and positive cardiac enzymes.  Cardiac catheterization demonstrated an ostial LAD stenosis of 99%.  Her other coronaries had insignificant disease.  Overall, systolic function was preserved.  She was recommended for coronary artery bypass grafting.  I examined the patient in her hospital room and reviewed the results of the cardiac catheterization with the patient.  I discussed the role of coronary artery bypass grafting of left IMA to LAD for treatment of her severe single-vessel coronary artery disease as the best long-term option.  I discussed the details of surgery including the use of general anesthesia and cardiopulmonary bypass, the location of the surgical incision, and the expected postoperative recovery.  I discussed the risks of bleeding, blood transfusion requirement, infection, MI, stroke, postoperative pulmonary problems including pleural effusion, and death. She demonstrated her understanding and agreed to proceed with surgery under what I felt was an informed consent.  OPERATIVE  FINDINGS: 1. Small left IMA, but with excellent flow. 2. Adequate LAD target. 3. No blood products required for the surgery.  OPERATIVE PROCEDURE:  The patient was brought to the operating room, placed supine on the operating table where general anesthesia was induced under invasive hemodynamic monitoring.  A transesophageal echo probe was placed by the anesthesiologist.  The patient was prepped and draped as a sterile field.  A proper time-out was performed.  A sternal incision was made.  The sternum was retracted.  The pericardium was exposed.  The left sternal edge was elevated with the sternal retractor to harvest the internal mammary artery as a pedicle graft from its origin at the left subclavian vessels.  It was 1.5-mm vessel with good flow.  The sternal retractor was placed using the deep blades due to the patient's obese body habitus.  The pericardium was opened and suspended. Heparin was administered.  Pursestrings were placed in the ascending aorta and right atrium.  The ACT was documented as being therapeutic, and the patient was cannulated and placed on cardiopulmonary bypass. The LAD was identified for grafting.  It was a 1.5-mm vessel without significant disease except at its origin.  Cardioplegia cannulas were placed both antegrade and retrograde cold blood cardioplegia.  The patient was cooled to 32 degrees.  The aortic crossclamp was applied. An 800 mL of cold blood cardioplegia was delivered in split doses between the antegrade aortic  and retrograde coronary sinus catheters. There was good cardioplegic arrest and septal temperature dropped less than 14 degrees.  The left IMA pedicle was brought through an opening in the left lateral pericardium.  The LAD was opened and was a 1.5-mm vessel.  An end-to- side anastomosis with running 8-0 Prolene was then constructed.  The bulldog pedicle clamp on the mammary pedicle was removed and there was excellent flow through  the anastomosis.  The bulldog was reapplied and the pedicle was secured to the epicardium.  The heart was replaced in its normal position.  The pedicle clamp on the mammary was removed.  The patient was given 200 mL of retrograde warm blood cardioplegia to remove any coronary air and reperfused the heart.  The cross-clamp was then removed.  The heart resumed a spontaneous rhythm.  The patient was reperfused and rewarmed.  Temporary pacing wires were applied.  The lungs were expanded and ventilator was resumed.  After the patient had been adequately rewarmed and reperfused, she was weaned from cardiopulmonary bypass without difficulty.  No inotropes were required.  Cardiac output was 5 L.  Protamine was administered without adverse reaction.  The cannula was removed.  The superior mediastinal fat was closed over the aorta.  The anterior mediastinal and left pleural chest tube were placed and brought out through separate incisions.  The sternum was closed with interrupted steel wire.  The pectoralis fascia was closed using a running #1 Vicryl.  The subcutaneous and skin layers were closed using a running Vicryl and sterile dressings were applied.  Total cardiopulmonary bypass time was 48 minutes.     Ivin Poot, M.D.     PV/MEDQ  D:  04/11/2015  T:  04/12/2015  Job:  [id number]

## 2015-04-12 NOTE — Care Management Note (Signed)
Case Management Note  Patient Details  Name: Jackalynn Lindblom MRN: [id number] Date of Birth: [birth year]  Subjective/Objective:      1 day S/P CABG              Action/Plan:  Pt is from a hotel with husband, does not have 24 hour supervision at discharge -  recent relocated from [place] for job opportunity that fell through.  CM consulted social work.  CM will continue to monitor for disposition needs   Expected Discharge Date:                  Expected Discharge Plan:  [place]  In-House Referral:  Clinical Social Work  Discharge planning Services     Post Acute Care Choice:    Choice offered to:     DME Arranged:    DME Agency:     HH Arranged:    [place] Agency:     Status of Service:  In process, will continue to follow  Medicare Important Message Given:    Date Medicare IM Given:    Medicare IM give by:    Date Additional Medicare IM Given:    Additional Medicare Important Message give by:     If discussed at [place] of Stay Meetings, dates discussed:    Additional Comments:  Maryclare Labrador, RN 04/12/2015, 10:50 AM

## 2015-04-12 NOTE — Progress Notes (Signed)
1 Day Post-Op Procedure(s) (LRB): CORONARY ARTERY BYPASS GRAFTING (CABG) x one using left internal mamary artery.  (N/A) TRANSESOPHAGEAL ECHOCARDIOGRAM (TEE) (N/A) Subjective: Chest incision sore Stable hemodynamics  Objective: Vital signs in last 24 hours: Temp:  [95.7 F (35.4 C)-100.4 F (38 C)] 99 F (37.2 C) (03/31 0745) Pulse Rate:  [72-107] 99 (03/31 0745) Cardiac Rhythm:  [-] Sinus tachycardia (03/31 0745) Resp:  [8-24] 10 (03/31 0745) BP: (90-145)/(59-110) 112/75 mmHg (03/31 0745) SpO2:  [95 %-100 %] 100 % (03/31 0745) Arterial Line BP: (86-164)/(52-89) 137/70 mmHg (03/31 0745) FiO2 (%):  [40 %-50 %] 40 % (03/30 1620) Weight:  [193 lb 5.5 oz (87.7 kg)] 193 lb 5.5 oz (87.7 kg) (03/31 0500)  Hemodynamic parameters for last 24 hours: PAP: (10-30)/(6-22) 16/12 mmHg CO:  [3 L/min-6.8 L/min] 4.6 L/min CI:  [1.8 L/min/m2-5.9 L/min/m2] 2.4 L/min/m2  Intake/Output from previous day: 03/30 0701 - 03/31 0700 In: 5628.5 [I.V.:4453.5; Blood:345; NG/GT:30; IV Piggyback:800] Out: 5100 [Urine:3285; Blood:1415; Chest Tube:400] Intake/Output this shift: Total I/O In: 50 [IV Piggyback:50] Out: -   Lungs clear  abd soft  Lab Results:  Recent Labs  04/11/15 1813 04/12/15 0007 04/12/15 0320  WBC 9.9  --  9.8  HGB 12.3 11.9* 11.3*  HCT 35.7* 35.0* 32.9*  PLT 206  --  183   BMET:  Recent Labs  04/11/15 0519  04/12/15 0007 04/12/15 0320  NA 138  < > 139 138  K 4.2  < > 4.1 4.3  CL 106  < > 102 106  CO2 23  --   --  24  GLUCOSE 156*  < > 115* 110*  BUN 6  < > 4* 5*  CREATININE 0.66  < > 0.60 0.65  CALCIUM 9.2  --   --  8.4*  < > = values in this interval not displayed.  PT/INR:  Recent Labs  04/11/15 1200  LABPROT 16.4*  INR 1.31   ABG    Component Value Date/Time   PHART 7.317* 04/11/2015 1813   HCO3 22.9 04/11/2015 1813   TCO2 23 04/12/2015 0007   ACIDBASEDEF 3.0* 04/11/2015 1813   O2SAT 96.0 04/11/2015 1813   CBG (last 3)   Recent Labs   04/11/15 2210 04/11/15 2304 04/12/15 0002  GLUCAP 107* 98 113*    Assessment/Plan: S/P Procedure(s) (LRB): CORONARY ARTERY BYPASS GRAFTING (CABG) x one using left internal mamary artery.  (N/A) TRANSESOPHAGEAL ECHOCARDIOGRAM (TEE) (N/A) Mobilize d/c tubes/lines See progression orders   LOS: 4 days    Kimberly [name] 04/12/2015

## 2015-04-12 NOTE — Progress Notes (Signed)
      [address]       [place] [address]             [phone]      Resting quietly  BP 124/87 mmHg  Pulse 118  Temp(Src) 98.9 F (37.2 C) (Oral)  Resp 14  Ht 5\' 6"  (1.676 m)  Wt 193 lb 5.5 oz (87.7 kg)  BMI 31.22 kg/m2  SpO2 100%   Intake/Output Summary (Last 24 hours) at 04/12/15 1959 Last data filed at 04/12/15 1900  Gross per 24 hour  Intake 2796.8 ml  Output   3835 ml  Net -1038.2 ml    Still tachycardic, will increase metoprolol  [name] C. Roxan Hockey, MD [facility] [phone]

## 2015-04-13 ENCOUNTER — Inpatient Hospital Stay (HOSPITAL_COMMUNITY): Payer: Self-pay

## 2015-04-13 LAB — CBC
HCT: 33.6 % — ABNORMAL LOW (ref 36.0–46.0)
Hemoglobin: 11.3 g/dL — ABNORMAL LOW (ref 12.0–15.0)
MCH: 28.6 pg (ref 26.0–34.0)
MCHC: 33.6 g/dL (ref 30.0–36.0)
MCV: 85.1 fL (ref 78.0–100.0)
Platelets: 211 10*3/uL (ref 150–400)
RBC: 3.95 MIL/uL (ref 3.87–5.11)
RDW: 12.9 % (ref 11.5–15.5)
WBC: 12.2 10*3/uL — ABNORMAL HIGH (ref 4.0–10.5)

## 2015-04-13 LAB — GLUCOSE, CAPILLARY
GLUCOSE-CAPILLARY: 117 mg/dL — AB (ref 65–99)
GLUCOSE-CAPILLARY: 170 mg/dL — AB (ref 65–99)
GLUCOSE-CAPILLARY: 135 mg/dL — AB (ref 65–99)
GLUCOSE-CAPILLARY: 151 mg/dL — AB (ref 65–99)
Glucose-Capillary: 131 mg/dL — ABNORMAL HIGH (ref 65–99)
Glucose-Capillary: 232 mg/dL — ABNORMAL HIGH (ref 65–99)

## 2015-04-13 LAB — BASIC METABOLIC PANEL
Anion gap: 9 (ref 5–15)
BUN: 5 mg/dL — ABNORMAL LOW (ref 6–20)
CO2: 27 mmol/L (ref 22–32)
Calcium: 8.7 mg/dL — ABNORMAL LOW (ref 8.9–10.3)
Chloride: 99 mmol/L — ABNORMAL LOW (ref 101–111)
Creatinine, Ser: 0.68 mg/dL (ref 0.44–1.00)
GFR calc Af Amer: >60 mL/min (ref >60)
GFR calc non Af Amer: >60 mL/min (ref >60)
Glucose, Bld: 129 mg/dL — ABNORMAL HIGH (ref 65–99)
Potassium: 3.9 mmol/L (ref 3.5–5.1)
Sodium: 135 mmol/L (ref 135–145)

## 2015-04-13 MED ORDER — SODIUM CHLORIDE 0.9 % IV SOLN: 250.0000 mL | INTRAVENOUS | Status: DC | PRN

## 2015-04-13 MED ORDER — INSULIN ASPART 100 UNIT/ML ~~LOC~~ SOLN: 0.0000 [IU] | Freq: Every day | SUBCUTANEOUS | Status: DC

## 2015-04-13 MED ORDER — ALUM & MAG HYDROXIDE-SIMETH 200-200-20 MG/5ML PO SUSP: 15.0000 mL | ORAL | Status: DC | PRN

## 2015-04-13 MED ORDER — ZOLPIDEM TARTRATE 5 MG PO TABS: 5.0000 mg | ORAL_TABLET | Freq: Every evening | ORAL | Status: DC | PRN

## 2015-04-13 MED ORDER — INSULIN DETEMIR 100 UNIT/ML ~~LOC~~ SOLN
15.0000 [IU] | Freq: Two times a day (BID) | SUBCUTANEOUS | Status: DC
  Filled 2015-04-13: qty 0.15

## 2015-04-13 MED ORDER — SODIUM CHLORIDE 0.9% FLUSH: 3.0000 mL | INTRAVENOUS | Status: DC | PRN

## 2015-04-13 MED ORDER — MAGNESIUM HYDROXIDE 400 MG/5ML PO SUSP: 30.0000 mL | Freq: Every day | ORAL | Status: DC | PRN

## 2015-04-13 MED ORDER — INSULIN ASPART 100 UNIT/ML ~~LOC~~ SOLN
0.0000 [IU] | Freq: Three times a day (TID) | SUBCUTANEOUS | Status: DC
  Administered 2015-04-13: 5 [IU] via SUBCUTANEOUS
  Administered 2015-04-14 (×3): 3 [IU] via SUBCUTANEOUS
  Administered 2015-04-15 (×2): 2 [IU] via SUBCUTANEOUS

## 2015-04-13 MED ORDER — MOVING RIGHT ALONG BOOK
Freq: Once | Status: DC
  Filled 2015-04-13: qty 1

## 2015-04-13 MED ORDER — SODIUM CHLORIDE 0.9% FLUSH
3.0000 mL | Freq: Two times a day (BID) | INTRAVENOUS | Status: DC
  Administered 2015-04-13 – 2015-04-15 (×4): 3 mL via INTRAVENOUS

## 2015-04-13 NOTE — Progress Notes (Signed)
2 Days Post-Op Procedure(s) (LRB): CORONARY ARTERY BYPASS GRAFTING (CABG) x one using left internal mamary artery.  (N/A) TRANSESOPHAGEAL ECHOCARDIOGRAM (TEE) (N/A) Subjective: Tired after walk  Objective: Vital signs in last 24 hours: Temp:  [98.8 F (37.1 C)-101.5 F (38.6 C)] 99 F (37.2 C) (04/01 0847) Pulse Rate:  [91-122] 118 (04/01 0600) Cardiac Rhythm:  [-] Sinus tachycardia (04/01 0600) Resp:  [9-26] 18 (04/01 0600) BP: (74-144)/(52-89) 111/60 mmHg (04/01 0600) SpO2:  [88 %-100 %] 100 % (04/01 0826) Weight:  [188 lb 7.9 oz (85.5 kg)] 188 lb 7.9 oz (85.5 kg) (04/01 0600)  Hemodynamic parameters for last 24 hours:    Intake/Output from previous day: 03/31 0701 - 04/01 0700 In: 2213.9 [P.O.:900; I.V.:1213.9; IV Piggyback:100] Out: 4200 [Urine:4170; Chest Tube:30] Intake/Output this shift:    General appearance: alert, cooperative and no distress Neurologic: intact Heart: regular rate and rhythm Lungs: diminished breath sounds bibasilar  Lab Results:  Recent Labs  04/12/15 1600 04/12/15 1602 04/13/15 0510  WBC 9.7  --  12.2*  HGB 11.8* 11.9* 11.3*  HCT 35.1* 35.0* 33.6*  PLT 199  --  211   BMET:  Recent Labs  04/12/15 0320  04/12/15 1602 04/13/15 0510  NA 138  --  134* 135  K 4.3  --  4.2 3.9  CL 106  --  95* 99*  CO2 24  --   --  27  GLUCOSE 110*  --  184* 129*  BUN 5*  --  4* 5*  CREATININE 0.65  < > 0.60 0.68  CALCIUM 8.4*  --   --  8.7*  < > = values in this interval not displayed.  PT/INR:  Recent Labs  04/11/15 1200  LABPROT 16.4*  INR 1.31   ABG    Component Value Date/Time   PHART 7.317* 04/11/2015 1813   HCO3 22.9 04/11/2015 1813   TCO2 25 04/12/2015 1602   ACIDBASEDEF 3.0* 04/11/2015 1813   O2SAT 96.0 04/11/2015 1813   CBG (last 3)   Recent Labs  04/12/15 2344 04/13/15 0357 04/13/15 0842  GLUCAP 131* 117* 170*    Assessment/Plan: S/P Procedure(s) (LRB): CORONARY ARTERY BYPASS GRAFTING (CABG) x one using left  internal mamary artery.  (N/A) TRANSESOPHAGEAL ECHOCARDIOGRAM (TEE) (N/A) -  Doing well  CV- stable  RESP- continue IS  RENAL- diuresed well yesterday, dc IV lasix and foley  Anemia- mild, follow  Transfer to [place]   LOS: 5 days    Melrose Nakayama 04/13/2015

## 2015-04-13 NOTE — Progress Notes (Signed)
04/13/2015 1230 Received pt to room 2W28 from 2S.  Pt is A&O.  No C/O voiced.  Tele applied and CCMD notified.  Oriented to room, call light and bed.  Call bell in reach and family at bedside. Carney Corners

## 2015-04-13 NOTE — Progress Notes (Signed)
CARDIAC REHAB PHASE I   PRE:  Rate/Rhythm: 110 sinus tach  BP:  Supine: 118/60  Sitting:   Standing:    SaO2:   MODE:  Ambulation: 180 ft   POST:  Rate/Rhythem: 112  BP:  Supine:   Sitting: 126/70  Standing:    SaO2: 96% Room Air  Pt ambulated 180 ft with assist x1 using rolling walker.  Pt's gait and steering erratic.  She had said that she as tired prior to starting.  Walk tolerated well without complaint.  Pt voided upon return and then returned to bed with VSS and call bell in reach.  We will f/u on Monday.  Pt encouraged to walk with staff over weekend. Alberteen Sam, MA, ACSM RCEP [phone]  Clotilde Dieter

## 2015-04-14 ENCOUNTER — Inpatient Hospital Stay (HOSPITAL_COMMUNITY): Payer: MEDICAID

## 2015-04-14 LAB — CBC
HEMATOCRIT: 34.3 % — AB (ref 36.0–46.0)
Hemoglobin: 11.4 g/dL — ABNORMAL LOW (ref 12.0–15.0)
MCH: 28.6 pg (ref 26.0–34.0)
MCHC: 33.2 g/dL (ref 30.0–36.0)
MCV: 86 fL (ref 78.0–100.0)
PLATELETS: 216 10*3/uL (ref 150–400)
RBC: 3.99 MIL/uL (ref 3.87–5.11)
RDW: 12.9 % (ref 11.5–15.5)
WBC: 10.9 10*3/uL — AB (ref 4.0–10.5)

## 2015-04-14 LAB — BASIC METABOLIC PANEL
ANION GAP: 12 (ref 5–15)
BUN: 7 mg/dL (ref 6–20)
CALCIUM: 8.5 mg/dL — AB (ref 8.9–10.3)
CO2: 25 mmol/L (ref 22–32)
Chloride: 100 mmol/L — ABNORMAL LOW (ref 101–111)
Creatinine, Ser: 0.68 mg/dL (ref 0.44–1.00)
GLUCOSE: 172 mg/dL — AB (ref 65–99)
POTASSIUM: 3.7 mmol/L (ref 3.5–5.1)
Sodium: 137 mmol/L (ref 135–145)

## 2015-04-14 LAB — GLUCOSE, CAPILLARY
GLUCOSE-CAPILLARY: 154 mg/dL — AB (ref 65–99)
GLUCOSE-CAPILLARY: 155 mg/dL — AB (ref 65–99)
GLUCOSE-CAPILLARY: 133 mg/dL — AB (ref 65–99)
Glucose-Capillary: 188 mg/dL — ABNORMAL HIGH (ref 65–99)

## 2015-04-14 MED ORDER — LISINOPRIL 5 MG PO TABS
5.0000 mg | ORAL_TABLET | Freq: Every day | ORAL | Status: DC
  Administered 2015-04-14 – 2015-04-15 (×2): 5 mg via ORAL
  Filled 2015-04-14 (×2): qty 1

## 2015-04-14 NOTE — Progress Notes (Signed)
04/14/2015 9:06 AM EPW D/C'd per order and per protocol.  Ends intact. Pt. Tolerated well.  Advised bedrest x1hr.  Call bell in reach.  Vital signs collected per protocol. Carney Corners

## 2015-04-14 NOTE — Progress Notes (Addendum)
      [address]       [place] [address]             [phone]      3 Days Post-Op Procedure(s) (LRB): CORONARY ARTERY BYPASS GRAFTING (CABG) x one using left internal mamary artery.  (N/A) TRANSESOPHAGEAL ECHOCARDIOGRAM (TEE) (N/A)   Subjective:  Patient awoken from sleep.  States she doesn't feel good because her chest hurts. + ambulation   Objective: Vital signs in last 24 hours: Temp:  [98.6 F (37 C)-100.2 F (37.9 C)] 100.2 F (37.9 C) (04/02 0413) Pulse Rate:  [95-119] 113 (04/02 0413) Cardiac Rhythm:  [-] Sinus tachycardia (04/02 0755) Resp:  [14-18] 18 (04/02 0413) BP: (105-131)/(57-80) 131/67 mmHg (04/02 0413) SpO2:  [93 %-99 %] 93 % (04/02 0413) Weight:  [185 lb 8 oz (84.142 kg)] 185 lb 8 oz (84.142 kg) (04/02 0413)  Intake/Output from previous day: 04/01 0701 - 04/02 0700 In: 30 [I.V.:30] Out: 550 [Urine:550]  General appearance: alert, cooperative and no distress Heart: regular rate and rhythm and tachy Lungs: clear to auscultation bilaterally Abdomen: soft, non-tender; bowel sounds normal; no masses,  no organomegaly Extremities: edema trace Wound: clean and dry  Lab Results:  Recent Labs  04/13/15 0510 04/14/15 0235  WBC 12.2* 10.9*  HGB 11.3* 11.4*  HCT 33.6* 34.3*  PLT 211 216   BMET:  Recent Labs  04/13/15 0510 04/14/15 0235  NA 135 137  K 3.9 3.7  CL 99* 100*  CO2 27 25  GLUCOSE 129* 172*  BUN 5* 7  CREATININE 0.68 0.68  CALCIUM 8.7* 8.5*    PT/INR:  Recent Labs  04/11/15 1200  LABPROT 16.4*  INR 1.31   ABG    Component Value Date/Time   PHART 7.317* 04/11/2015 1813   HCO3 22.9 04/11/2015 1813   TCO2 25 04/12/2015 1602   ACIDBASEDEF 3.0* 04/11/2015 1813   O2SAT 96.0 04/11/2015 1813   CBG (last 3)   Recent Labs  04/13/15 1611 04/13/15 2107 04/14/15 0612  GLUCAP 232* 151* 188*    Assessment/Plan: S/P Procedure(s) (LRB): CORONARY ARTERY BYPASS GRAFTING (CABG) x one using left internal  mamary artery.  (N/A) TRANSESOPHAGEAL ECHOCARDIOGRAM (TEE) (N/A)  1. CV- Sinus Tach- on Lopressor at 50 mg BID, mild hypertension- will start low dose ACE, will monitor HR and can increase beta blocker if rate doesn't improve 2. Pulm- no acute issues, not on oxygen, continue IS 3. Renal- creatinine stable, weight is at baseline no on Lasix 4. DM- sugars controlled for the most part, there was one sugar above 200, continue current insulin regimen 5. Dispo- patient stable, will add ACE for BP control, monitor HR can increase Lopressor if remains tachy, d/c EPW today   LOS: 6 days    Kimberly [name], Kimberly [name] 04/14/2015  C/o pain when only 1 oxy, but oversedated when she gets 2 Has not ambulated today  [name] C. Roxan Hockey, MD [facility] [phone]

## 2015-04-14 NOTE — Progress Notes (Signed)
04/14/2015 4:49 PM I have encouraged the pt to walk all day.  Everytime there is an excuse for why she can't walk.  Last time i asked she stated "I did walk, I went to the bathroom."  Will continue to encourage the walking. Carney Corners

## 2015-04-14 NOTE — Discharge Instructions (Signed)
Coronary Artery Bypass Grafting, Care After °Refer to this sheet in the next few weeks. These instructions provide you with information on caring for yourself after your procedure. Your health care provider may also give you more specific instructions. Your treatment has been planned according to current medical practices, but problems sometimes occur. Call your health care provider if you have any problems or questions after your procedure. °WHAT TO EXPECT AFTER THE PROCEDURE °Recovery from surgery will be different for everyone. Some people feel well after 3 or 4 weeks, while for others it takes longer. After your procedure, it is typical to have the following: °· Nausea and a lack of appetite.   °· Constipation. °· Weakness and fatigue.   °· Depression or irritability.   °· Pain or discomfort at your incision site. °HOME CARE INSTRUCTIONS °· Take medicines only as directed by your health care provider. Do not stop taking medicines or start any new medicines without first checking with your health care provider. °· Take your pulse as directed by your health care provider. °· Perform deep breathing as directed by your health care provider. If you were given a device called an incentive spirometer, use it to practice deep breathing several times a day. Support your chest with a pillow or your arms when you take deep breaths or cough. °· Keep incision areas clean, dry, and protected. Remove or change any bandages (dressings) only as directed by your health care provider. You may have skin adhesive strips over the incision areas. Do not take the strips off. They will fall off on their own. °· Check incision areas daily for any swelling, redness, or drainage. °· If incisions were made in your legs, do the following: °¨ Avoid crossing your legs.   °¨ Avoid sitting for long periods of time. Change positions every 30 minutes.   °¨ Elevate your legs when you are sitting. °· Wear compression stockings as directed by your  health care provider. These stockings help keep blood clots from forming in your legs. °· Take showers once your health care provider approves. Until then, only take sponge baths. Pat incisions dry. Do not rub incisions with a washcloth or towel. Do not take baths, swim, or use a hot tub until your health care provider approves. °· Eat foods that are high in fiber, such as raw fruits and vegetables, whole grains, beans, and nuts. Meats should be lean cut. Avoid canned, processed, and fried foods. °· Drink enough fluid to keep your urine clear or pale yellow. °· Weigh yourself every day. This helps identify if you are retaining fluid that may make your heart and lungs work harder. °· Rest and limit activity as directed by your health care provider. You may be instructed to: °¨ Stop any activity at once if you have chest pain, shortness of breath, irregular heartbeats, or dizziness. Get help right away if you have any of these symptoms. °¨ Move around frequently for short periods or take short walks as directed by your health care provider. Increase your activities gradually. You may need physical therapy or cardiac rehabilitation to help strengthen your muscles and build your endurance. °¨ Avoid lifting, pushing, or pulling anything heavier than 10 lb (4.5 kg) for at least 6 weeks after surgery. °· Do not drive until your health care provider approves.  °· Ask your health care provider when you may return to work. °· Ask your health care provider when you may resume sexual activity. °· Keep all follow-up visits as directed by your health care   provider. This is important. °SEEK MEDICAL CARE IF: °· You have swelling, redness, increasing pain, or drainage at the site of an incision. °· You have a fever. °· You have swelling in your ankles or legs. °· You have pain in your legs.   °· You gain 2 or more pounds (0.9 kg) a day. °· You are nauseous or vomit. °· You have diarrhea.  °SEEK IMMEDIATE MEDICAL CARE IF: °· You have  chest pain that goes to your jaw or arms. °· You have shortness of breath.   °· You have a fast or irregular heartbeat.   °· You notice a "clicking" in your breastbone (sternum) when you move.   °· You have numbness or weakness in your arms or legs. °· You feel dizzy or light-headed.   °MAKE SURE YOU: °· Understand these instructions. °· Will watch your condition. °· Will get help right away if you are not doing well or get worse. °  °This information is not intended to replace advice given to you by your health care provider. Make sure you discuss any questions you have with your health care provider. °  °Document Released: 07/18/2004 Document Revised: 01/19/2014 Document Reviewed: 06/07/2012 °Elsevier Interactive Patient Education ©2016 [employer] ° °

## 2015-04-14 NOTE — Discharge Summary (Signed)
Physician Discharge Summary  Patient ID: Kimberly [name] MRN: [id number] DOB/AGE: Jun 24, 1965 50 y.o.  Admit date: 04/06/2015 Discharge date: 04/15/2015  Admission Diagnoses:  Patient Active Problem List   Diagnosis Date Noted  . Unstable angina ([place])   . Hypertensive heart disease without heart failure   . Chest tightness or pressure 04/06/2015  . Chest pain 04/06/2015  . Diabetes mellitus, type 2 ([place]) 04/06/2015  . Hyperlipidemia 04/06/2015  . ADHD (attention deficit hyperactivity disorder) 04/06/2015  . Tobacco abuse 04/06/2015  . Asthma 04/06/2015   Discharge Diagnoses:   Patient Active Problem List   Diagnosis Date Noted  . S/P CABG x 1 04/11/2015  . Unstable angina ([place])   . Hypertensive heart disease without heart failure   . Chest tightness or pressure 04/06/2015  . Chest pain 04/06/2015  . Diabetes mellitus, type 2 ([place]) 04/06/2015  . Hyperlipidemia 04/06/2015  . ADHD (attention deficit hyperactivity disorder) 04/06/2015  . Tobacco abuse 04/06/2015  . Asthma 04/06/2015   Discharged Condition: good  History of Present Illness:  Kimberly [name] is 50 yo obese African American Female.  She developed angina symptoms while carrying heavy objects at work.  The episodes were increasing in intensity and frequency.  The symptoms would relieve with rest.  However on 04/06/2015 she presented to the ED with her symptoms.  She was admitted with minimally elevated cardiac enzymes.  Echocardiogram showed preserved LV function .  She had cardiac catheterization which revealed significant disease of her LAD.  It was felt coronary bypass grafting would be indicated and TCTS consult was obtained.  She was evaluated by Dr. Prescott Gum who was in agreement the patient would benefit from bypass surgery.  The risks and benefits of the procedure were explained to the patient and she was agreeable to proceed.    Hospital Course:   She remained chest pain free during her hospitalization.  She was  taken to the operating room on 04/11/2015.  She underwent CABG x 1 utilizing her LIMA to LAD.  She tolerated the procedure without difficulty and was taken to the SICU in stable condition.  She was extubated the evening of surgery.  During her stay in the SICU she remained hemodynamically stable.  Her chest tubes and arterial lines were removed without difficulty.  She had some Tachycardia and her lopressor dose was increased.  She was ambulating in the SICU and felt stable for transfer to the telemetry unit on POD #2.  The patient continues to make progress.  She has mild hypertension and has been started on an ACE inhibitor.  She continues to maintain NSR and her pacing wires have been removed.  She continues to have some tachycardia and her lopressor was again titrated.  She is ambulating without difficulty.  She is tolerating a heart healthy diet.  She is currently living out of a hotel currently and does not have supervision at discharge. CSW was unable to provide assistance with placement to a SNF due to patient not meeting admission qualifications. Post discharge arrangements have been made with [facility].  She will get her medicines through the [facility].  She will be give a 1 month prescription for anxiety medications and Aderall.  She will need to establish care for further management of these medications.  We have encouraged patient to stay in the hospital for another 24 hours so we could monitor her and potentially find her more assistance.  She was not agreeable to this and wishes  to be discharged home today.    Significant Diagnostic Studies: angiography:    Ost LAD to Prox LAD lesion, 95% stenosed.  Severe tortuosity in the right subclavian made torquing catheters quite difficult. Would not use the right radial in the future for access site for cardiac cath.  Treatments: surgery:   Coronary artery bypass grafting x1 (left internal mammary artery to left  anterior descending).  Disposition:   Discharge Medications:  The patient has been discharged on:   1.Beta Blocker:  Yes [  x]                              No   [   ]                              If No, reason:  2.Ace Inhibitor/ARB: Yes [ x ]                                     No  [    ]                                     If No, reason:  3.Statin:   Yes [  x]                  No  [   ]                  If No, reason:  4.Ecasa:  Yes  [ x ]                  No   [   ]                  If No, reason:        Medication List    STOP taking these medications        celecoxib 100 MG capsule  Commonly known as:  CELEBREX     cyclobenzaprine 10 MG tablet  Commonly known as:  FLEXERIL     traZODone 100 MG tablet  Commonly known as:  DESYREL      TAKE these medications        albuterol 108 (90 Base) MCG/ACT inhaler  Commonly known as:  PROVENTIL HFA;VENTOLIN HFA  Inhale 1 puff into the lungs every 6 (six) hours as needed for wheezing or shortness of breath.     albuterol (2.5 MG/3ML) 0.083% nebulizer solution  Commonly known as:  PROVENTIL  Take 3 mLs (2.5 mg total) by nebulization every 6 (six) hours as needed for wheezing or shortness of breath.     ALPRAZolam 1 MG tablet  Commonly known as:  XANAX  Take 1 tablet (1 mg total) by mouth 2 (two) times daily as needed for anxiety.     amphetamine-dextroamphetamine 20 MG tablet  Commonly known as:  ADDERALL  Take 1 tablet (20 mg total) by mouth daily.     aspirin 325 MG EC tablet  Take 1 tablet (325 mg total) by mouth daily.     Fluticasone-Salmeterol 500-50 MCG/DOSE Aepb  Commonly known as:  ADVAIR  Inhale 1 puff into the lungs 2 (two) times daily.  furosemide 40 MG tablet  Commonly known as:  LASIX  Take 1 tablet (40 mg total) by mouth daily. For 7 Days     hydrOXYzine 25 MG tablet  Commonly known as:  ATARAX/VISTARIL  Take 25 mg by mouth 3 (three) times daily as needed for anxiety.      hydrOXYzine 25 MG tablet  Commonly known as:  ATARAX/VISTARIL  Take 1 tablet (25 mg total) by mouth 3 (three) times daily as needed for anxiety.     insulin glargine 100 unit/mL Sopn  Commonly known as:  LANTUS  Inject 0.3 mLs (30 Units total) into the skin daily after breakfast.     Insulin Glulisine 100 UNIT/ML Solostar Pen  Commonly known as:  APIDRA SOLOSTAR  Inject 22 Units into the skin 2 (two) times daily after a meal.     lisinopril 5 MG tablet  Commonly known as:  PRINIVIL,ZESTRIL  Take 1 tablet (5 mg total) by mouth daily.     metoprolol 50 MG tablet  Commonly known as:  LOPRESSOR  Take 1 tablet (50 mg total) by mouth 2 (two) times daily.     MUSCLE RUB EX  Apply 1 application topically 2 (two) times daily as needed (pain).     oxyCODONE 5 MG immediate release tablet  Commonly known as:  Oxy IR/ROXICODONE  Take 1-2 tablets (5-10 mg total) by mouth every 3 (three) hours as needed for severe pain.     Potassium Chloride ER 20 MEQ Tbcr  Take 20 mEq by mouth daily. For 7 Days     rosuvastatin 10 MG tablet  Commonly known as:  CRESTOR  Take 1 tablet (10 mg total) by mouth daily.       Follow-up Information    Follow up with Len Childs, MD.   Specialty:  Cardiothoracic Surgery   Contact information:   [place] [address] [phone]       Follow up with [facility].   Contact information:   [place]       Follow up with Rosaria Ferries, PA-C On 05/06/2015.   Specialties:  Cardiology, Radiology   Why:  Appointment is at 11:00   Contact information:   [place] [address] [phone]       Follow up with [facility]. Go on 04/23/2015.   Specialty:  Internal Medicine   Why:  at 4:00pm for a hospital follow up appointment.    Contact information:   [id number] [place] [phone]      Follow up  with [facility].   Why:  Registered Nurse   Contact information:   [address] [place] [address] [phone]       Follow up with [facility].   Why:  Games developer information:   [address] [place] [address] [phone]       Follow up with [facility].   Why:  Please pick up medications at this location.    Contact information:   [place] [phone]      Signed: [name] 04/15/2015, 4:04 PM

## 2015-04-15 LAB — GLUCOSE, CAPILLARY
GLUCOSE-CAPILLARY: 139 mg/dL — AB (ref 65–99)
GLUCOSE-CAPILLARY: 132 mg/dL — AB (ref 65–99)

## 2015-04-15 MED ORDER — HYDROXYZINE HCL 25 MG PO TABS: 25.0000 mg | ORAL_TABLET | Freq: Three times a day (TID) | ORAL | Status: DC | PRN

## 2015-04-15 MED ORDER — ROSUVASTATIN CALCIUM 10 MG PO TABS: 10.0000 mg | ORAL_TABLET | Freq: Every day | ORAL | Status: DC

## 2015-04-15 MED ORDER — ALBUTEROL SULFATE (2.5 MG/3ML) 0.083% IN NEBU: 2.5000 mg | INHALATION_SOLUTION | Freq: Four times a day (QID) | RESPIRATORY_TRACT | Status: DC | PRN

## 2015-04-15 MED ORDER — POTASSIUM CHLORIDE ER 20 MEQ PO TBCR: 20.0000 meq | EXTENDED_RELEASE_TABLET | Freq: Every day | ORAL | Status: DC

## 2015-04-15 MED ORDER — INSULIN GLARGINE 100 UNITS/ML SOLOSTAR PEN: 30.0000 [IU] | PEN_INJECTOR | Freq: Every day | SUBCUTANEOUS | Status: DC

## 2015-04-15 MED ORDER — METOPROLOL TARTRATE 50 MG PO TABS: 50.0000 mg | ORAL_TABLET | Freq: Two times a day (BID) | ORAL | Status: DC

## 2015-04-15 MED ORDER — FUROSEMIDE 40 MG PO TABS: 40.0000 mg | ORAL_TABLET | Freq: Every day | ORAL | Status: DC

## 2015-04-15 MED ORDER — LISINOPRIL 5 MG PO TABS: 5.0000 mg | ORAL_TABLET | Freq: Every day | ORAL | Status: DC

## 2015-04-15 MED ORDER — AMPHETAMINE-DEXTROAMPHETAMINE 20 MG PO TABS: 20.0000 mg | ORAL_TABLET | Freq: Every day | ORAL | Status: DC

## 2015-04-15 MED ORDER — FLUTICASONE-SALMETEROL 500-50 MCG/DOSE IN AEPB: 1.0000 | INHALATION_SPRAY | Freq: Two times a day (BID) | RESPIRATORY_TRACT | Status: DC

## 2015-04-15 MED ORDER — ALPRAZOLAM 1 MG PO TABS: 1.0000 mg | ORAL_TABLET | Freq: Two times a day (BID) | ORAL | Status: DC | PRN

## 2015-04-15 MED ORDER — ASPIRIN 325 MG PO TBEC: 325.0000 mg | DELAYED_RELEASE_TABLET | Freq: Every day | ORAL | Status: DC

## 2015-04-15 MED ORDER — ALBUTEROL SULFATE (2.5 MG/3ML) 0.083% IN NEBU: 2.5000 mg | INHALATION_SOLUTION | Freq: Four times a day (QID) | RESPIRATORY_TRACT | Status: AC | PRN

## 2015-04-15 MED ORDER — OXYCODONE HCL 5 MG PO TABS: 5.0000 mg | ORAL_TABLET | ORAL | Status: DC | PRN

## 2015-04-15 MED ORDER — ALBUTEROL SULFATE HFA 108 (90 BASE) MCG/ACT IN AERS: 1.0000 | INHALATION_SPRAY | Freq: Four times a day (QID) | RESPIRATORY_TRACT | Status: DC | PRN

## 2015-04-15 MED ORDER — INSULIN GLULISINE 100 UNIT/ML SOLOSTAR PEN: 22.0000 [IU] | PEN_INJECTOR | Freq: Two times a day (BID) | SUBCUTANEOUS | Status: DC

## 2015-04-15 MED ORDER — FUROSEMIDE 40 MG PO TABS
40.0000 mg | ORAL_TABLET | Freq: Every day | ORAL | Status: DC
  Administered 2015-04-15: 40 mg via ORAL
  Filled 2015-04-15: qty 1

## 2015-04-15 MED ORDER — ALBUTEROL SULFATE (2.5 MG/3ML) 0.083% IN NEBU
2.5000 mg | INHALATION_SOLUTION | Freq: Four times a day (QID) | RESPIRATORY_TRACT | Status: DC
  Filled 2015-04-15 (×2): qty 3

## 2015-04-15 MED FILL — !VENTOLIN HFA INHALER: 108 (90 BAS | 30 days supply | Qty: 18 | Fill #0

## 2015-04-15 MED FILL — ?METOPROLOL 50 MG TABLET: 50 | 30 days supply | Qty: 60 | Fill #0

## 2015-04-15 MED FILL — ALBUTEROL 0.083% INHAL SOLN: (2.5 MG/3ML | 30 days supply | Qty: 90 | Fill #0

## 2015-04-15 MED FILL — POTASSIUM CL ER 20 MEQ TAB: 20 | 7 days supply | Qty: 7 | Fill #0

## 2015-04-15 MED FILL — ?FUROSEMIDE 40 MG TABLET: 40 | 7 days supply | Qty: 7 | Fill #0

## 2015-04-15 MED FILL — LISINOPRIL 5 MG TABLET: 5 | 30 days supply | Qty: 30 | Fill #0

## 2015-04-15 MED FILL — ?HYDROXYZINE HCL 25 MG TAB: 25 | 10 days supply | Qty: 30 | Fill #0

## 2015-04-15 MED FILL — **ADVAIR 500/50 DISKUS: 500-50 MCG | 30 days supply | Qty: 60 | Fill #0

## 2015-04-15 MED FILL — ROSUVASTATIN CAL 10 MG TAB: 10 | 30 days supply | Qty: 30 | Fill #0

## 2015-04-15 NOTE — Progress Notes (Signed)
CSW assisting with care plan for pt- pt was agreeable to SNF if eligible but CSW supervisor does not approve LOG due to pt relatively high level of mobility.  Pt is connected with [facility] in [place] for healthcare- [place] spoke with care manager there who provided contact numbers and pt ID # to assist with placement  Member ID#: [id number] RN station [phone] Case Manager- [phone]  CSW left messages at above numbers  Domenica Reamer, [place] Worker [phone]

## 2015-04-15 NOTE — Progress Notes (Addendum)
      [address]       [place] [address]             [phone]      4 Days Post-Op Procedure(s) (LRB): CORONARY ARTERY BYPASS GRAFTING (CABG) x one using left internal mamary artery.  (N/A) TRANSESOPHAGEAL ECHOCARDIOGRAM (TEE) (N/A)   Subjective:  Ms. Kimberly [name] complains of coughing with wheezing this morning.  She refuses to ambulate and has been encouraged to do so.  Objective: Vital signs in last 24 hours: Temp:  [98.2 F (36.8 C)-98.5 F (36.9 C)] 98.2 F (36.8 C) (04/03 0438) Pulse Rate:  [94-103] 94 (04/03 0748) Cardiac Rhythm:  [-] Sinus tachycardia (04/02 1920) Resp:  [18] 18 (04/03 0748) BP: (104-119)/(60-71) 104/68 mmHg (04/03 0438) SpO2:  [95 %-99 %] 99 % (04/03 0748) Weight:  [181 lb 4.8 oz (82.237 kg)] 181 lb 4.8 oz (82.237 kg) (04/03 0444)  Intake/Output from previous day: 04/02 0701 - 04/03 0700 In: 363 [P.O.:360; I.V.:3] Out: -   General appearance: alert, cooperative and no distress Heart: regular rate and rhythm Lungs: wheezes bibasilar Abdomen: soft, non-tender; bowel sounds normal; no masses,  no organomegaly Extremities: edema trace Wound: clean and dry  Lab Results:  Recent Labs  04/13/15 0510 04/14/15 0235  WBC 12.2* 10.9*  HGB 11.3* 11.4*  HCT 33.6* 34.3*  PLT 211 216   BMET:  Recent Labs  04/13/15 0510 04/14/15 0235  NA 135 137  K 3.9 3.7  CL 99* 100*  CO2 27 25  GLUCOSE 129* 172*  BUN 5* 7  CREATININE 0.68 0.68  CALCIUM 8.7* 8.5*    PT/INR: No results for input(s): LABPROT, INR in the last 72 hours. ABG    Component Value Date/Time   PHART 7.317* 04/11/2015 1813   HCO3 22.9 04/11/2015 1813   TCO2 25 04/12/2015 1602   ACIDBASEDEF 3.0* 04/11/2015 1813   O2SAT 96.0 04/11/2015 1813   CBG (last 3)   Recent Labs  04/14/15 1622 04/14/15 2109 04/15/15 0624  GLUCAP 155* 133* 139*    Assessment/Plan: S/P Procedure(s) (LRB): CORONARY ARTERY BYPASS GRAFTING (CABG) x one using left internal  mamary artery.  (N/A) TRANSESOPHAGEAL ECHOCARDIOGRAM (TEE) (N/A)  1. Cv- Sinus Tach, BP improved- continue Lopressor at 50 mg BID, continue Lisinopril 2. Pulm- not on oxygen, + cough with clear sputum, add Mucinex, wheezing on exam will add albuterol nebs as patient states uses at home 3. Renal- weight is below baseline, on Lasix  4. DM- continue current regimen 5. Disop- patient stable, add nebs for wheezing, dispo arrangements being arranged, hopefully ready for d/c in the next 24-48 hours   LOS: 7 days    [name] 04/15/2015 We'll request social worker to help with patent placement as she lives in a hotel room without a support person.  Sternal incision clean and dry   patient examined and medical record reviewed,agree with above note. [name] 04/15/2015

## 2015-04-15 NOTE — Hospital Discharge Follow-Up (Signed)
Jacqlyn Krauss, RN CM requesting a hospital follow up appointment or the patient. An appointment was scheduled or 04/23/15 @ 1600 and the information was placed on the AVS.

## 2015-04-15 NOTE — Progress Notes (Signed)
CARDIAC REHAB PHASE I   PRE:  Rate/Rhythm: 105 ST    BP: sitting 118/70    SaO2: 93 RA  MODE:  Ambulation: 140 ft   POST:  Rate/Rhythm: 115 ST    BP: sitting 90/62     SaO2: 94 RA  Pt slow to wake but agreeable to walk. Upon standing noted pt with glazed eyes, staggering gait. Used gait belt and RW. Pt not confused, could answer questions appropriately, however had scissoring gait and difficulty focusing, very slow pace. I got assistance after 60 ft and pt sat and rested. Apparently she had been more lucid earlier this am and up in room/chair. Pt had received 5 mg Oxy and a xanax per RN. Able to return to room with continued ataxic gait. To bed for safety. Will f/u, prob as x2. Pt would benefit from PT c/s to help with gait and also placement.  [place], ACSM 04/15/2015 10:18 AM

## 2015-04-15 NOTE — Care Management Note (Signed)
Case Management Note  Patient Details  Name: Shaherah Ferris MRN: [id number] Date of Birth: [birth year]  Subjective/Objective:  Pt admitted for chest pain s/p CABG.                  Action/Plan: Pt is without insurance and PCP- Appointment made at the [facility]. Pt will be able to get medications from the clinic. [place] [facility] to be provided by [facility]- Referral made. SOC to begin within 24-48 hours of d/c. No further needs from CM at this time. DME to be delivered to room.    Expected Discharge Date:                  Expected Discharge Plan:  [place]  In-House Referral:  Clinical Social Work  Discharge planning Services  CM Consult, Follow-up appt scheduled, [place] Acute Care Choice:  Home Health Choice offered to:  Patient  DME Arranged:  N/A DME Agency:  NA  HH Arranged:  RN [place] Agency:  [facility]  Status of Service:  Completed, signed off  Medicare Important Message Given:    Date Medicare IM Given:    Medicare IM give by:    Date Additional Medicare IM Given:    Additional Medicare Important Message give by:     If discussed at [place] of Stay Meetings, dates discussed:    Additional Comments:  Bethena Roys, RN 04/15/2015, 4:04 PM

## 2015-04-15 NOTE — Progress Notes (Signed)
Pt. Upset that social work cannot place pt in a rehab facility. Pt is from [place] and is living in a hotel here in [place]. Pt is eager to go home with daughter. PA notified and consulted with pt about different options. Pt still eager to be discharged. IV removed. Chest tube sutures removed. Telemetry discontinued. Case management consulted to help with home health RN and resources for medication. Pt has been discharged. [facility]. Pt walker delivered to room. Pt set up with community wellness clinic in [place]. Prescriptions given to pt. Pt educated on the importance of follow up appointments, how to take care of incision sites.

## 2015-04-15 NOTE — Progress Notes (Signed)
Called by Lesly Rubenstein RN, stating patient wishes to be discharged today.  I spoke with patient and her husband who stated no one is helping them with discharge placement so they wish to leave.  I spoke with CSW who states the patient is ambulating too far to be placed in a SNF and they would be unable to approve an LOC.    I spoke with care management who was able to have home health donated. She was also able to ger her set up at a community wellness center for her medications.  Patient has no home meds, will give her 1 month prescription and she will need to establish primary care here for further refills.   Heleena Miceli PA-C

## 2015-04-16 MED FILL — APIDRA SOLOSTAR 100 UNITS/M: 100 | 34 days supply | Qty: 15 | Fill #0

## 2015-04-19 DIAGNOSIS — Z48812 Encounter for surgical aftercare following surgery on the circulatory system: Secondary | ICD-10-CM

## 2015-04-22 ENCOUNTER — Ambulatory Visit (HOSPITAL_BASED_OUTPATIENT_CLINIC_OR_DEPARTMENT_OTHER): Payer: Self-pay | Admitting: Clinical

## 2015-04-22 DIAGNOSIS — F4329 Adjustment disorder with other symptoms: Secondary | ICD-10-CM | POA: Insufficient documentation

## 2015-04-22 DIAGNOSIS — F4323 Adjustment disorder with mixed anxiety and depressed mood: Secondary | ICD-10-CM

## 2015-04-22 MED FILL — ?FUROSEMIDE 40 MG TABLET: 40 | 7 days supply | Qty: 7 | Fill #0

## 2015-04-22 MED FILL — !LANTUS SOLOSTAR 100UNITS/M: 100 | 20 days supply | Qty: 6 | Fill #0

## 2015-04-22 NOTE — Progress Notes (Signed)
ASSESSMENT: Pt currently experiencing Adjustment reaction with mixed emotional features. Pt needs to establish care with PCP; would benefit from brief therapeutic interventions, as well as community resources and establishing psychiatric care for [facility] med managerment.  Stage of Change: contemplative  PLAN: 1. F/U with behavioral health consultant in as needed 2. Psychiatric Medications: Xanax, Atarax, Adderall 3. Behavioral recommendation(s):   -Consider [facility] for ADHD treatment -Consider [facility], [facility], or [facility] for [employer] -Take food box home today -Consider community resources for housing, food, and utilities, as discussed in office visit SUBJECTIVE: Pt. referred by Dr Doreene Burke for emotion:  Pt. 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 Pt had heart surgery less than two weeks prior, is living in a hotel, needs to find permanent housing, needs food resources; primary concern is obtaining ADHD medication with no insurance in [place]. Pt concerned that she gets distracted easily, and may forget steps she needs to take.  Duration of problem: Two weeks Severity: severe  OBJECTIVE: Orientation & Cognition: Oriented x3. Thought processes normal and appropriate to situation. Mood: teary, anxious Affect: appropriate Appearance: appropriate Risk of harm to self or others: no known risk of harm to self or others Substance use: alcohol, tobacco Assessments administered: none today, inappropriate today(crying, visibly shaken)  Diagnosis: Adjustment reaction with mixed emotional features CPT Code: F43.23 -------------------------------------------- Other(s) present in the room: daughter  Time spent with patient in exam room: 20 minutes

## 2015-04-23 ENCOUNTER — Encounter: Payer: Self-pay | Admitting: Internal Medicine

## 2015-04-23 ENCOUNTER — Ambulatory Visit: Payer: Self-pay | Attending: Internal Medicine | Admitting: Internal Medicine

## 2015-04-23 VITALS — BP 118/74 | HR 116 | Temp 98.8°F | Wt 182.2 lb

## 2015-04-23 DIAGNOSIS — Z720 Tobacco use: Secondary | ICD-10-CM

## 2015-04-23 DIAGNOSIS — E118 Type 2 diabetes mellitus with unspecified complications: Secondary | ICD-10-CM

## 2015-04-23 DIAGNOSIS — E1165 Type 2 diabetes mellitus with hyperglycemia: Secondary | ICD-10-CM

## 2015-04-23 DIAGNOSIS — I119 Hypertensive heart disease without heart failure: Secondary | ICD-10-CM

## 2015-04-23 DIAGNOSIS — F4323 Adjustment disorder with mixed anxiety and depressed mood: Secondary | ICD-10-CM

## 2015-04-23 DIAGNOSIS — Z951 Presence of aortocoronary bypass graft: Secondary | ICD-10-CM

## 2015-04-23 DIAGNOSIS — F4329 Adjustment disorder with other symptoms: Secondary | ICD-10-CM

## 2015-04-23 DIAGNOSIS — E785 Hyperlipidemia, unspecified: Secondary | ICD-10-CM

## 2015-04-23 LAB — GLUCOSE, POCT (MANUAL RESULT ENTRY): POC Glucose: 91 mg/dl (ref 70–99)

## 2015-04-23 MED ORDER — CITALOPRAM HYDROBROMIDE 20 MG PO TABS: 20.0000 mg | ORAL_TABLET | Freq: Every day | ORAL | Status: DC

## 2015-04-23 MED ORDER — MELATONIN 3 MG PO TABS: 1.0000 | ORAL_TABLET | Freq: Every evening | ORAL | Status: DC | PRN

## 2015-04-23 MED ORDER — ROSUVASTATIN CALCIUM 10 MG PO TABS: 10.0000 mg | ORAL_TABLET | Freq: Every day | ORAL | Status: DC

## 2015-04-23 MED ORDER — FLUTICASONE-SALMETEROL 500-50 MCG/DOSE IN AEPB: 1.0000 | INHALATION_SPRAY | Freq: Two times a day (BID) | RESPIRATORY_TRACT | Status: DC

## 2015-04-23 MED FILL — ?CITALOPRAM HBR 20 MG TABLE: 20 | 30 days supply | Qty: 30 | Fill #0

## 2015-04-23 NOTE — Progress Notes (Signed)
Kimberly [name], is a 50 y.o. female  [id number]  [id number]  DOB - February 17, 1965  CC:  Chief Complaint  Patient presents with  . Hospitalization Follow-up       HPI: Kimberly [name] is a 50 y.o. female here today to establish medical care.  New from [place].  Sp hospitalization for 3/25-4/3 for unstable angina, found to have severe 1 vessel disease in LAD, sp CABG 1v on 04/11/15.  Currently living in hotel.  She denies smoking tobacco currently, but using E-cigs.  Of note, she still has postsurgical pains and difficult time getting to sleep b/c of body aches.  She also notes anxiety due to her current medical problems, her heart rate has been quite fast last few days, 110s at times.  She admits to not using her Advair  Yet, and only using her nebulizer at nighttime.  She is using her Albuterol inhaler 4-5 x day now.  Patient has No headache,  No abdominal pain - No Nausea, No new weakness tingling or numbness, No Cough; +very DOE w/ exertion still.  Denies si/hi/ah/vh.  Reviewed her sugars at home, ranging in 140s in am, noon 200s.  Allergies  Allergen Reactions  . Strattera [Atomoxetine Hcl] Other (See Comments)    Caused yeast infection per [facility] records  . Pyridium [Phenazopyridine Hcl] Itching   Past Medical History  Diagnosis Date  . Asthma   . Diabetes mellitus without complication ([place])   . Myocardial infarction [facility])    Current Outpatient Prescriptions on File Prior to Visit  Medication Sig Dispense Refill  . albuterol (PROVENTIL HFA;VENTOLIN HFA) 108 (90 Base) MCG/ACT inhaler Inhale 1 puff into the lungs every 6 (six) hours as needed for wheezing or shortness of breath. 18 g 1  . albuterol (PROVENTIL) (2.5 MG/3ML) 0.083% nebulizer solution Take 3 mLs (2.5 mg total) by nebulization every 6 (six) hours as needed for wheezing or shortness of breath. 75 mL 12  . ALPRAZolam (XANAX) 1 MG tablet Take 1 tablet (1 mg total) by mouth 2 (two) times daily as needed for  anxiety. 30 tablet 0  . amphetamine-dextroamphetamine (ADDERALL) 20 MG tablet Take 1 tablet (20 mg total) by mouth daily. 30 tablet 0  . aspirin 325 MG EC tablet Take 1 tablet (325 mg total) by mouth daily. 30 tablet 0  . furosemide (LASIX) 40 MG tablet Take 1 tablet (40 mg total) by mouth daily. For 7 Days 7 tablet 0  . hydrOXYzine (ATARAX/VISTARIL) 25 MG tablet Take 1 tablet (25 mg total) by mouth 3 (three) times daily as needed for anxiety. 30 tablet 0  . insulin glargine (LANTUS) 100 unit/mL SOPN Inject 0.3 mLs (30 Units total) into the skin daily after breakfast. 15 mL 11  . Insulin Glulisine (APIDRA SOLOSTAR) 100 UNIT/ML Solostar Pen Inject 22 Units into the skin 2 (two) times daily after a meal. 15 mL 11  . lisinopril (PRINIVIL,ZESTRIL) 5 MG tablet Take 1 tablet (5 mg total) by mouth daily. 30 tablet 3  . Menthol-Methyl Salicylate (MUSCLE RUB EX) Apply 1 application topically 2 (two) times daily as needed (pain).    . metoprolol (LOPRESSOR) 50 MG tablet Take 1 tablet (50 mg total) by mouth 2 (two) times daily. 60 tablet 3  . oxyCODONE (OXY IR/ROXICODONE) 5 MG immediate release tablet Take 1-2 tablets (5-10 mg total) by mouth every 3 (three) hours as needed for severe pain. 30 tablet 0  . Potassium Chloride ER 20 MEQ TBCR Take 20 mEq by mouth daily.  For 7 Days 7 tablet 0   No current facility-administered medications on file prior to visit.   Family History  Problem Relation Age of Onset  . Heart disease      grandfather  . Diabetes      mother   Social History   Social History  . Marital Status: Single    Spouse Name: N/A  . Number of Children: N/A  . Years of Education: N/A   Occupational History  . Not on file.   Social History Main Topics  . Smoking status: Current Every Day Smoker    Types: Cigarettes  . Smokeless tobacco: Not on file  . Alcohol Use: Yes  . Drug Use: No  . Sexual Activity: Not on file   Other Topics Concern  . Not on file   Social History  Narrative    Review of Systems: Constitutional: Negative for fever, chills, diaphoresis, activity change, + not eating as well, appetite changed since surgery, +  fatigue. HENT: Negative for ear pain, nosebleeds, congestion, facial swelling, rhinorrhea, neck pain, neck stiffness and ear discharge.  Eyes: Negative for pain, discharge, redness, itching and visual disturbance. Respiratory: Negative for cough, choking, chest tightness,  wheezing and stridor.  Gets easily winded/sob/doe. Cardiovascular: Negative for palpitations and leg swelling.  +post surg chest pain and heart rate beating fast. Gastrointestinal: Negative for abdominal distention. Genitourinary: Negative for dysuria, urgency, frequency, hematuria, flank pain, decreased urine volume, difficulty urinating and dyspareunia.  Musculoskeletal: Negative for back pain, joint swelling, arthralgia and gait problem. Neurological: Negative for dizziness, tremors, seizures, syncope, facial asymmetry, speech difficulty, weakness, light-headedness, numbness and headaches.  Hematological: Negative for adenopathy. Does not bruise/bleed easily. Psychiatric/Behavioral: Negative for hallucinations, behavioral problems, confusion, dysphoric mood, decreased concentration; + feels anxious/anxiety about her current medical problems, not currently taking her Adderall due to heart rate. Denies si/hi/ah/vh.   Objective:   Filed Vitals:   04/23/15 1624  BP: 118/74  Pulse: 116  Temp: 98.8 F (37.1 C)    Physical Exam: Constitutional: Patient appears well-developed and well-nourished. No distress. AAOx3, walking w/ rolling walker, seems DOE, but able to speak in full sentences.  HENT: Normocephalic, atraumatic, External right and left ear normal. Oropharynx is clear and moist.  Eyes: Conjunctivae and EOM are normal. PERRL, no scleral icterus. Neck: Normal ROM. Neck supple. No JVD. CVS: RRR, tachycardic, S1/S2 +, no murmurs, no gallops, no carotid  bruit. , well healing mid-sternotomy scar. Pulmonary: Effort and breath sounds normal, no stridor, rhonchi, wheezes, rales.  Abdominal: Soft. BS +, no distension, tenderness, rebound or guarding.  Musculoskeletal: Normal range of motion. No edema bilat le.   Lymphadenopathy: No lymphadenopathy noted, cervical,  Neuro: Alert.  muscle tone coordination. No cranial nerve deficit grossly. Skin: Skin is warm and dry. No rash noted. Not diaphoretic. No erythema. No pallor. Psychiatric: Normal mood and affect. Behavior, judgment, thought content normal.   Anxious, but very pleasant.   Lab Results  Component Value Date   WBC 10.9* 04/14/2015   HGB 11.4* 04/14/2015   HCT 34.3* 04/14/2015   MCV 86.0 04/14/2015   PLT 216 04/14/2015   Lab Results  Component Value Date   CREATININE 0.68 04/14/2015   BUN 7 04/14/2015   NA 137 04/14/2015   K 3.7 04/14/2015   CL 100* 04/14/2015   CO2 25 04/14/2015    Lab Results  Component Value Date   HGBA1C 8.0* 04/07/2015   Lipid Panel     Component Value Date/Time  CHOL 204* 04/06/2015 1816   TRIG 266* 04/06/2015 1816   HDL 48 04/06/2015 1816   CHOLHDL 4.3 04/06/2015 1816   VLDL 53* 04/06/2015 1816   LDLCALC 103* 04/06/2015 1816       Assessment and plan:   1. Hypertensive heart disease without heart failure - continue bb, acei, lasix,  -defer to cards f/u  2. S/P CABG x 1 (04/11/15) - continue asa325, statin, bb - fu w/ cards and CTS  3. Tachycardia - suspect multifactorial, anxiety, as well as medication induced - she is using her Albuterol inhaler 4-5 x day currently. - advised her to use her nebulizer 2x day for now, and use her ADVAIR bid (hasn't started yet), only use Albuterol as rescue. - trial celexa  4. Uncontrolled type 2 diabetes mellitus with complication, unspecified long term insulin use status (HCC) Glu today 99, her sugars at home decent range as well - encouraged low carb/low salt diet, info given, she drinks  gatorade, I dw her that it has quite a bit of sugar as well (at least 24g!) - keep same regimen for now  5. Adjustment reaction with mixed emotional features, due to acute/chronic medical illness Trial celexa  6. Tobacco abuse - now only using Ecigs  7. Hyperlipidemia On statin, reck in few months  8. Adhd/anxiety - defer to behavior health for adderall/atavan/atarax - dw pt to hold on on adderall currently due to tachcardia - she was seen by SW Roselyn Reef yesterday and was given list of community resources  9. Insomnia - multfactorial, trial melatonin, sleep hygiene discussed as well.  Return in about 2 weeks (around 05/07/2015).  The patient was given clear instructions to go to ER or return to medical center if symptoms don't improve, worsen or new problems develop. The patient verbalized understanding. The patient was told to call to get lab results if they haven't heard anything in the next week.      Maren Reamer, MD, [facility], [place]   04/23/2015, 4:49 PM

## 2015-05-06 ENCOUNTER — Encounter: Payer: Self-pay | Admitting: Physician Assistant

## 2015-05-06 ENCOUNTER — Ambulatory Visit (INDEPENDENT_AMBULATORY_CARE_PROVIDER_SITE_OTHER): Payer: Self-pay | Admitting: Physician Assistant

## 2015-05-06 VITALS — BP 100/64 | HR 72 | Ht 65.5 in | Wt 183.4 lb

## 2015-05-06 DIAGNOSIS — I119 Hypertensive heart disease without heart failure: Secondary | ICD-10-CM

## 2015-05-06 DIAGNOSIS — E1169 Type 2 diabetes mellitus with other specified complication: Secondary | ICD-10-CM

## 2015-05-06 DIAGNOSIS — Z951 Presence of aortocoronary bypass graft: Secondary | ICD-10-CM

## 2015-05-06 DIAGNOSIS — I2510 Atherosclerotic heart disease of native coronary artery without angina pectoris: Secondary | ICD-10-CM

## 2015-05-06 DIAGNOSIS — E785 Hyperlipidemia, unspecified: Secondary | ICD-10-CM

## 2015-05-06 NOTE — Patient Instructions (Signed)
Medication Instructions:  Your physician recommends that you continue on your current medications as directed. Please refer to the Current Medication list given to you today.   Labwork: Your physician recommends that you return for lab work in: 3 months for Cholesterol and Liver. To be followed by Primary Care Physician per request of patient.  The lab can be found on the FIRST FLOOR of out building in [address]   Testing/Procedures: none  Follow-Up: Your physician recommends that you schedule a follow-up appointment in: 3 months with Dr. Oval Linsey  Referral place for outpatient cardiac rehab. You will be contacted with more details.   Any Other Special Instructions Will Be Listed Below (If Applicable).     If you need a refill on your cardiac medications before your next appointment, please call your pharmacy.

## 2015-05-06 NOTE — Progress Notes (Signed)
Cardiology Office Note   Date:  05/06/2015   ID:  Kimberly [name], DOB 1965/11/20, MRN [id number]  PCP:  Maren Reamer, MD  Cardiologist:  New, Dr Oliver Barre, PA-C   Chief Complaint  Patient presents with  . Follow-up    has little discomfort in chest area, has shortness of breath, no edema, no pain or cramping in legs, no lightheaded or dizziness    History of Present Illness: Kimberly [name] is a 50 y.o. female with a history of DM, HLD, tobacco use.  D/c to SNF 04/02 after MI>>CABG w/ LIMA-LAD, EF is preserved. She is now home.  Kimberly [name] presents for Post hospital follow-up.  She is doing much better now. Right after discharge from the hospital, she struggled with feeling like she didn't want to get out of bed. She was able to gradually increase her activity. She has started feeling better and is getting back closer to her old self. She is interested in doing cardiac rehabilitation. She is trying to be compliant with a low-sodium diabetic diet and his reading more labels now, although she never was much of the salt user.  She has been compliant with the medications and took the Lasix/potassium for 7 days as directed and then stop. She has not had lower extremity edema, orthopnea or PND. She has had trouble sleeping because her bus size makes it difficult for her to get comfortable and when she turns it. On the incisions. She has had no drainage or signs of infection.  She moved recently and because of that was more active than she had been. She got overtired but was able to recover with one day of rest.  She has actually started driving but not very much and agrees to stop driving until cleared by the surgeons. She was to know when she can go back to work and what kind of work she can do.   Past Medical History  Diagnosis Date  . Asthma   . Diabetes mellitus without complication ([place])   . Myocardial infarction ([place])     Approx 2014 per pt report, no  further details available  . CAD (coronary artery disease) 04/06/2015    CABG with LIMA-LAD    Past Surgical History  Procedure Laterality Date  . Abdominal hysterectomy    . Appendectomy    . Cardiac catheterization N/A 04/08/2015    Procedure: Left Heart Cath and Coronary Angiography;  Surgeon: Jettie Booze, MD; LAD 95%, single vessel disease   . Coronary artery bypass graft N/A 04/11/2015    Procedure: CORONARY ARTERY BYPASS GRAFTING (CABG) x one using left internal mamary artery. ;  Surgeon: Ivin Poot, MD;  LIMA-LAD  . Tee without cardioversion N/A 04/11/2015    Procedure: TRANSESOPHAGEAL ECHOCARDIOGRAM (TEE);  Surgeon: Ivin Poot, MD;  Location: [facility];  Service: Open Heart Surgery;  Laterality: N/A;    Current Outpatient Prescriptions  Medication Sig Dispense Refill  . albuterol (PROVENTIL HFA;VENTOLIN HFA) 108 (90 Base) MCG/ACT inhaler Inhale 1 puff into the lungs every 6 (six) hours as needed for wheezing or shortness of breath. 18 g 1  . albuterol (PROVENTIL) (2.5 MG/3ML) 0.083% nebulizer solution Take 3 mLs (2.5 mg total) by nebulization every 6 (six) hours as needed for wheezing or shortness of breath. 75 mL 12  . amphetamine-dextroamphetamine (ADDERALL) 20 MG tablet Take 1 tablet (20 mg total) by mouth daily. 30 tablet 0  . aspirin 325 MG EC tablet Take 1  tablet (325 mg total) by mouth daily. 30 tablet 0  . citalopram (CELEXA) 20 MG tablet Take 1 tablet (20 mg total) by mouth daily. 30 tablet 3  . Fluticasone-Salmeterol (ADVAIR) 500-50 MCG/DOSE AEPB Inhale 1 puff into the lungs 2 (two) times daily. 60 each 2  . furosemide (LASIX) 40 MG tablet Take 1 tablet (40 mg total) by mouth daily. For 7 Days 7 tablet 0  . hydrOXYzine (ATARAX/VISTARIL) 25 MG tablet Take 1 tablet (25 mg total) by mouth 3 (three) times daily as needed for anxiety. 30 tablet 0  . insulin glargine (LANTUS) 100 unit/mL SOPN Inject 0.3 mLs (30 Units total) into the skin daily after breakfast. 15 mL  11  . Insulin Glulisine (APIDRA SOLOSTAR) 100 UNIT/ML Solostar Pen Inject 22 Units into the skin 2 (two) times daily after a meal. 15 mL 11  . lisinopril (PRINIVIL,ZESTRIL) 5 MG tablet Take 1 tablet (5 mg total) by mouth daily. 30 tablet 3  . Melatonin 3 MG TABS Take 1 tablet (3 mg total) by mouth at bedtime as needed. 30 tablet 0  . metoprolol (LOPRESSOR) 50 MG tablet Take 1 tablet (50 mg total) by mouth 2 (two) times daily. 60 tablet 3  . Potassium Chloride ER 20 MEQ TBCR Take 20 mEq by mouth daily. For 7 Days 7 tablet 0  . rosuvastatin (CRESTOR) 10 MG tablet Take 1 tablet (10 mg total) by mouth daily. 30 tablet 3   No current facility-administered medications for this visit.    Allergies:   Strattera and Pyridium    Social History:  The patient  reports that she has quit smoking. Her smoking use included Cigarettes. She does not have any smokeless tobacco history on file. She reports that she drinks alcohol. She reports that she does not use illicit drugs.   Family History:  The patient's family history is not on file.    ROS:  Please see the history of present illness. All other systems are reviewed and negative.    PHYSICAL EXAM: VS:  BP 100/64 mmHg  Pulse 72  Ht 5' 5.5" (1.664 m)  Wt 183 lb 6 oz (83.178 kg)  BMI 30.04 kg/m2 , BMI Body mass index is 30.04 kg/(m^2). GEN: Well nourished, well developed, female in no acute distress HEENT: normal for age  Neck: no JVD, no carotid bruit, no masses Cardiac: RRR; no murmur, no rubs, or gallops Respiratory:  clear to auscultation bilaterally, normal work of breathing GI: soft, nontender, nondistended, + BS MS: no deformity or atrophy; no edema; distal pulses are 2+ in all 4 extremities  Skin: warm and dry, no rash Neuro:  Strength and sensation are intact Psych: euthymic mood, full affect   EKG:  EKG is ordered today. The ekg ordered today demonstrates sinus rhythm, inverted T waves in V2 through V5, consistent with evolving CAD  changes  Echo: 04/08/2015 - Left ventricle: Distal septal hypokinesis The cavity size was  mildly dilated. Wall thickness was normal. Systolic function was  normal. The estimated ejection fraction was in the range of 50%  to 55%. Doppler parameters are consistent with elevated  ventricular end-diastolic filling pressure. - Aortic valve: There was mild regurgitation. - Atrial septum: No defect or patent foramen ovale was identified.  CATH: 04/08/2015  Ost LAD to Prox LAD lesion, 95% stenosed.  Severe tortuosity in the right subclavian made torquing catheters quite difficult. Would not use the right radial in the future for access site for cardiac cath. Severe ostial LAD  stenosis. There is no landing zone for stent. Fixing this percutaneously would require leaving stents in the distal left main and putting a large ramus and circumflex at risk to be jailed. She would also likely have to be on lifelong clopidogrel. I think a better option would be to have a surgical consult for possible LIMA to LAD. She has a good target vessel in the mid to distal LAD.  Recent Labs: 04/09/2015: TSH 0.654 04/12/2015: Magnesium 1.6* 04/14/2015: BUN 7; Creatinine, Ser 0.68; Hemoglobin 11.4*; Platelets 216; Potassium 3.7; Sodium 137    Lipid Panel    Component Value Date/Time   CHOL 204* 04/06/2015 1816   TRIG 266* 04/06/2015 1816   HDL 48 04/06/2015 1816   CHOLHDL 4.3 04/06/2015 1816   VLDL 53* 04/06/2015 1816   LDLCALC 103* 04/06/2015 1816     Wt Readings from Last 3 Encounters:  05/06/15 183 lb 6 oz (83.178 kg)  04/23/15 182 lb 3.2 oz (82.645 kg)  04/15/15 181 lb 4.8 oz (82.237 kg)     Other studies Reviewed: Additional studies/ records that were reviewed today include: Hospital records and ECGs.  ASSESSMENT AND PLAN:  1.  CAD: She is doing well from a cardiac standpoint. Her blood pressure is on the low side of normal, but she is asymptomatic with this. She is tolerating current medical  therapy well. I will make no changes at this time. I advised her she felt she was getting lightheaded or dizzy or having any problems with low blood pressure, she should call us. She is on good medical therapy with aspirin, statin, beta blocker and ACE inhibitor.  2.s/p CABG: Advised her that she should not be driving. Advised her that she should follow-up with the surgeons and discuss returning to work and other activity restrictions with them.  I advised that if she wanted to fill out applications and do things from home to look for a job, that would be okay.  3. Hyperlipidemia: We will check hepatic function and cholesterol profile at her follow-up appointment   Current medicines are reviewed at length with the patient today.  The patient does not have concerns regarding medicines.  The following changes have been made:  no change  Labs/ tests ordered today include:   Orders Placed This Encounter  Procedures  . Lipid panel  . Hepatic function panel  . AMB referral to cardiac rehabilitation  . EKG 12-Lead     Disposition:   FU with Dr. Oval Linsey  Signed, Troi Bechtold, [address]  05/06/2015 12:32 PM    [place] Phone: [phone]; Fax: [phone]  This note was written with the assistance of speech recognition software. Please excuse any transcriptional errors.

## 2015-05-07 ENCOUNTER — Telehealth: Payer: Self-pay | Admitting: *Deleted

## 2015-05-07 NOTE — Telephone Encounter (Signed)
Faxed referral to cardiac rehab, confirmation received

## 2015-05-13 ENCOUNTER — Telehealth: Payer: Self-pay | Admitting: Cardiovascular Disease

## 2015-05-13 NOTE — Telephone Encounter (Signed)
Pt came to office today to get a work note. According to Rosaria Ferries, [place] office note from 05/06/15, she advised pt to go to Dr Nils Pyle for job recommendations. This information given to pt. She was under the impression we would give her the work note. Pt verbalized understanding and will talk to Dr Lucianne Lei Tright's office. She has an appt there on 05/15/15.

## 2015-05-13 NOTE — Telephone Encounter (Signed)
Kimberly [name] is calling because she is needing a note to take to [employer]employment to show that restrictions on what kind of job she can do. She was told she can work but she has to have a sit down type of Job. ( Past Job  Chief Operating Officer ) she is needing to have the letter on today .... Please call

## 2015-05-13 NOTE — Telephone Encounter (Signed)
Left message to call back Reviewed chart and Suanne Marker PA who saw patient post hospital recommended that she discuss her job restrictions with surgeon Patient has appointment with Dr Nils Pyle on 05/15/15

## 2015-05-14 ENCOUNTER — Other Ambulatory Visit: Payer: Self-pay | Admitting: Cardiothoracic Surgery

## 2015-05-14 DIAGNOSIS — Z951 Presence of aortocoronary bypass graft: Secondary | ICD-10-CM

## 2015-05-15 ENCOUNTER — Ambulatory Visit
Admission: RE | Admit: 2015-05-15 | Discharge: 2015-05-15 | Disposition: A | Discharge status: Home / Self Care | Payer: No Typology Code available for payment source | Source: Ambulatory Visit | Attending: Cardiothoracic Surgery | Admitting: Cardiothoracic Surgery

## 2015-05-15 ENCOUNTER — Other Ambulatory Visit: Payer: Self-pay | Admitting: *Deleted

## 2015-05-15 ENCOUNTER — Ambulatory Visit (INDEPENDENT_AMBULATORY_CARE_PROVIDER_SITE_OTHER): Payer: Self-pay | Admitting: Cardiothoracic Surgery

## 2015-05-15 ENCOUNTER — Encounter: Payer: Self-pay | Admitting: Cardiothoracic Surgery

## 2015-05-15 VITALS — BP 114/73 | HR 62 | Resp 16 | Ht 65.5 in | Wt 183.0 lb

## 2015-05-15 DIAGNOSIS — Z951 Presence of aortocoronary bypass graft: Secondary | ICD-10-CM

## 2015-05-15 DIAGNOSIS — G8918 Other acute postprocedural pain: Secondary | ICD-10-CM

## 2015-05-15 MED ORDER — HYDROCODONE-ACETAMINOPHEN 5-325 MG PO TABS: 1.0000 | ORAL_TABLET | Freq: Four times a day (QID) | ORAL | Status: DC | PRN

## 2015-05-15 MED FILL — ?METOPROLOL 50 MG TABLET: 50 | 30 days supply | Qty: 60 | Fill #0

## 2015-05-15 MED FILL — LISINOPRIL 5 MG TABLET: 5 | 30 days supply | Qty: 30 | Fill #0

## 2015-05-15 MED FILL — ?CITALOPRAM HBR 20 MG TABLE: 20 | 30 days supply | Qty: 30 | Fill #1

## 2015-05-15 MED FILL — ROSUVASTATIN CALCIUM 10 MG: 10 | 30 days supply | Qty: 30 | Fill #0

## 2015-05-15 NOTE — Progress Notes (Signed)
PCP is Maren Reamer, MD Referring Provider is Troy Sine, MD  Chief Complaint  Patient presents with  . Routine Post Op    f/u from surgery with CXR s/p Coronary artery bypass grafting x1 on 04/11/15    [id number] doing well one month after urgent CABG for unstable angina. She left IMA graft to the LAD. LV function was fairly well preserved. She's had no recurrent angina. Surgical incisions are well-healed. She is not smoking tobacco. No symptoms of fluid retention or CHF. Chest x-ray today is clear.   Past Medical History  Diagnosis Date  . Asthma   . Diabetes mellitus without complication ([place])   . Myocardial infarction ([place])     Approx 2014 per pt report, no further details available  . CAD (coronary artery disease) 04/06/2015    CABG with LIMA-LAD    Past Surgical History  Procedure Laterality Date  . Abdominal hysterectomy    . Appendectomy    . Cardiac catheterization N/A 04/08/2015    Procedure: Left Heart Cath and Coronary Angiography;  Surgeon: Jettie Booze, MD; LAD 95%, single vessel disease   . Coronary artery bypass graft N/A 04/11/2015    Procedure: CORONARY ARTERY BYPASS GRAFTING (CABG) x one using left internal mamary artery. ;  Surgeon: Ivin Poot, MD;  LIMA-LAD  . Tee without cardioversion N/A 04/11/2015    Procedure: TRANSESOPHAGEAL ECHOCARDIOGRAM (TEE);  Surgeon: Ivin Poot, MD;  Location: [facility];  Service: Open Heart Surgery;  Laterality: N/A;    Family History  Problem Relation Age of Onset  . Heart disease      grandfather  . Diabetes      mother    Social History Social History  Substance Use Topics  . Smoking status: Former Smoker    Types: Cigarettes  . Smokeless tobacco: None  . Alcohol Use: 0.0 oz/week    0 Standard drinks or equivalent per week    Current Outpatient Prescriptions  Medication Sig Dispense Refill  . albuterol (PROVENTIL HFA;VENTOLIN HFA) 108 (90 Base) MCG/ACT inhaler Inhale 1 puff into the  lungs every 6 (six) hours as needed for wheezing or shortness of breath. 18 g 1  . albuterol (PROVENTIL) (2.5 MG/3ML) 0.083% nebulizer solution Take 3 mLs (2.5 mg total) by nebulization every 6 (six) hours as needed for wheezing or shortness of breath. 75 mL 12  . amphetamine-dextroamphetamine (ADDERALL) 20 MG tablet Take 1 tablet (20 mg total) by mouth daily. 30 tablet 0  . aspirin 325 MG EC tablet Take 1 tablet (325 mg total) by mouth daily. 30 tablet 0  . citalopram (CELEXA) 20 MG tablet Take 1 tablet (20 mg total) by mouth daily. 30 tablet 3  . Fluticasone-Salmeterol (ADVAIR) 500-50 MCG/DOSE AEPB Inhale 1 puff into the lungs 2 (two) times daily. 60 each 2  . hydrOXYzine (ATARAX/VISTARIL) 25 MG tablet Take 1 tablet (25 mg total) by mouth 3 (three) times daily as needed for anxiety. 30 tablet 0  . insulin glargine (LANTUS) 100 unit/mL SOPN Inject 0.3 mLs (30 Units total) into the skin daily after breakfast. 15 mL 11  . Insulin Glulisine (APIDRA SOLOSTAR) 100 UNIT/ML Solostar Pen Inject 22 Units into the skin 2 (two) times daily after a meal. 15 mL 11  . lisinopril (PRINIVIL,ZESTRIL) 5 MG tablet Take 1 tablet (5 mg total) by mouth daily. 30 tablet 3  . Melatonin 3 MG TABS Take 1 tablet (3 mg total) by mouth at bedtime as needed. 30 tablet 0  .  metoprolol (LOPRESSOR) 50 MG tablet Take 1 tablet (50 mg total) by mouth 2 (two) times daily. 60 tablet 3  . rosuvastatin (CRESTOR) 10 MG tablet Take 1 tablet (10 mg total) by mouth daily. 30 tablet 3   No current facility-administered medications for this visit.    Allergies  Allergen Reactions  . Strattera [Atomoxetine Hcl] Other (See Comments)    Caused yeast infection per [facility] records  . Pyridium [Phenazopyridine Hcl] Itching    Review of Systems Improved appetite Not eating much pain medicine No sternal clicking  BP 0000000 mmHg  Pulse 62  Resp 16  Ht 5' 5.5" (1.664 m)  Wt 183 lb (83.008 kg)  BMI 29.98 kg/m2  SpO2 98% Physical  Exam Alertand comfortable Lungs clear Sternum well-healed Heart regular murmur No pedal edema  Diagnostic Tests: Chest x-ray clear  Impression: She may lift now up to 20 pounds. She was given a return to work note with above limitation and toe July 1 Continue current medications.return to see primary cardiologist and primary care physician Plan:return as needed here Encouraged to remain tobacco free, heart healthy diet, 20 minute walk daily.   Len Childs, MD [facility] [phone]

## 2015-05-31 ENCOUNTER — Ambulatory Visit: Payer: Self-pay | Attending: Internal Medicine | Admitting: Physician Assistant

## 2015-05-31 VITALS — BP 113/68 | HR 71 | Temp 98.0°F | Resp 16 | Ht 65.5 in | Wt 187.6 lb

## 2015-05-31 DIAGNOSIS — Z7982 Long term (current) use of aspirin: Secondary | ICD-10-CM | POA: Insufficient documentation

## 2015-05-31 DIAGNOSIS — Z79899 Other long term (current) drug therapy: Secondary | ICD-10-CM | POA: Insufficient documentation

## 2015-05-31 DIAGNOSIS — G44209 Tension-type headache, unspecified, not intractable: Secondary | ICD-10-CM

## 2015-05-31 DIAGNOSIS — R51 Headache: Secondary | ICD-10-CM | POA: Insufficient documentation

## 2015-05-31 DIAGNOSIS — J45909 Unspecified asthma, uncomplicated: Secondary | ICD-10-CM | POA: Insufficient documentation

## 2015-05-31 DIAGNOSIS — M62830 Muscle spasm of back: Secondary | ICD-10-CM

## 2015-05-31 DIAGNOSIS — Z794 Long term (current) use of insulin: Secondary | ICD-10-CM | POA: Insufficient documentation

## 2015-05-31 DIAGNOSIS — E119 Type 2 diabetes mellitus without complications: Secondary | ICD-10-CM | POA: Insufficient documentation

## 2015-05-31 DIAGNOSIS — I252 Old myocardial infarction: Secondary | ICD-10-CM | POA: Insufficient documentation

## 2015-05-31 DIAGNOSIS — I2510 Atherosclerotic heart disease of native coronary artery without angina pectoris: Secondary | ICD-10-CM | POA: Insufficient documentation

## 2015-05-31 LAB — CBC WITH DIFFERENTIAL/PLATELET
BASOS ABS: 54 {cells}/uL (ref 0–200)
Basophils Relative: 1 %
EOS PCT: 5 %
Eosinophils Absolute: 270 cells/uL (ref 15–500)
HCT: 35.7 % (ref 35.0–45.0)
HEMOGLOBIN: 11.8 g/dL (ref 11.7–15.5)
LYMPHS ABS: 2538 {cells}/uL (ref 850–3900)
Lymphocytes Relative: 47 %
MCH: 27.6 pg (ref 27.0–33.0)
MCHC: 33.1 g/dL (ref 32.0–36.0)
MCV: 83.4 fL (ref 80.0–100.0)
MPV: 8.9 fL (ref 7.5–12.5)
Monocytes Absolute: 324 cells/uL (ref 200–950)
Monocytes Relative: 6 %
NEUTROS ABS: 2214 {cells}/uL (ref 1500–7800)
Neutrophils Relative %: 41 %
Platelets: 367 10*3/uL (ref 140–400)
RBC: 4.28 MIL/uL (ref 3.80–5.10)
RDW: 14.4 % (ref 11.0–15.0)
WBC: 5.4 10*3/uL (ref 3.8–10.8)

## 2015-05-31 MED ORDER — METHOCARBAMOL 500 MG PO TABS: 1000.0000 mg | ORAL_TABLET | Freq: Four times a day (QID) | ORAL | Status: DC

## 2015-05-31 NOTE — Progress Notes (Signed)
Pt here for a knot in the center of the back of her neck. Patient denies any pain, but reports stiffness and muscle aches in the area. Patient recognized the knot two days ago and has been having headaches for a week now. Pt reports that it is difficult for her to turn her head due to the knot.

## 2015-05-31 NOTE — Progress Notes (Signed)
Patient ID: Kimberly [name], female   DOB: 05-10-1965, 50 y.o.   MRN: [id number]   Kimberly [name], is a 50 y.o. female  [id number]  [id number]  DOB - 1965/08/17  Chief Complaint  Patient presents with  . Cyst    neck        Subjective:  Chief Complaint and HPI: Kimberly [name] is a 50 y.o. female here today for a "knot" on her back.  She first noticed it about 2 days ago.  She has been having some mild to moderate headaches for the past week, but just noticed the knot.  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  She does carry plates but she is not carrying heavy trays.  No f/c. No vision changes.  No URI s/sx. Headache starts at back of neck and goes all the way around her head.   Recent CABG-all stable at last cardiology appt.  Compliant with medications.  Blood sugars running at/around 100.  Notes reviewed.   ROS:   Constitutional:  No f/c, No night sweats, No unexplained weight loss. EENT:  No vision changes, No blurry vision, No hearing changes. No mouth, throat, or ear problems.  Respiratory: No cough, No SOB Cardiac: No CP, no palpitations GI:  No abd pain, No N/V/D. GU: No Urinary s/sx Musculoskeletal: No joint pain Neuro: +headache, no dizziness, no motor weakness. No paresthesias Skin: No rash Endocrine:  No polydipsia. No polyuria.  Psych: Denies SI/HI  No problems updated.  ALLERGIES: Allergies  Allergen Reactions  . Strattera [Atomoxetine Hcl] Other (See Comments)    Caused yeast infection per [facility] records  . Pyridium [Phenazopyridine Hcl] Itching    PAST MEDICAL HISTORY: Past Medical History  Diagnosis Date  . Asthma   . Diabetes mellitus without complication ([place])   . Myocardial infarction ([place])     Approx 2014 per pt report, no further details available  . CAD (coronary artery disease) 04/06/2015    CABG with LIMA-LAD    MEDICATIONS AT HOME: Prior to Admission medications   Medication Sig Start Date End Date Taking?  Authorizing Provider  albuterol (PROVENTIL HFA;VENTOLIN HFA) 108 (90 Base) MCG/ACT inhaler Inhale 1 puff into the lungs every 6 (six) hours as needed for wheezing or shortness of breath. 04/15/15  Yes Erin R Barrett, PA-C  albuterol (PROVENTIL) (2.5 MG/3ML) 0.083% nebulizer solution Take 3 mLs (2.5 mg total) by nebulization every 6 (six) hours as needed for wheezing or shortness of breath. 04/15/15  Yes Erin R Barrett, PA-C  amphetamine-dextroamphetamine (ADDERALL) 20 MG tablet Take 1 tablet (20 mg total) by mouth daily. 04/15/15  Yes Erin R Barrett, PA-C  aspirin 325 MG EC tablet Take 1 tablet (325 mg total) by mouth daily. 04/15/15  Yes Erin R Barrett, PA-C  citalopram (CELEXA) 20 MG tablet Take 1 tablet (20 mg total) by mouth daily. 04/23/15  Yes Maren Reamer, MD  Fluticasone-Salmeterol (ADVAIR) 500-50 MCG/DOSE AEPB Inhale 1 puff into the lungs 2 (two) times daily. 04/23/15  Yes Maren Reamer, MD  insulin glargine (LANTUS) 100 unit/mL SOPN Inject 0.3 mLs (30 Units total) into the skin daily after breakfast. 04/15/15  Yes Erin R Barrett, PA-C  Insulin Glulisine (APIDRA SOLOSTAR) 100 UNIT/ML Solostar Pen Inject 22 Units into the skin 2 (two) times daily after a meal. 04/15/15  Yes Erin R Barrett, PA-C  lisinopril (PRINIVIL,ZESTRIL) 5 MG tablet Take 1 tablet (5 mg total) by mouth daily. 04/15/15  Yes Erin R Barrett, PA-C  Melatonin 3 MG TABS Take 1 tablet (3 mg total) by mouth at bedtime as needed. 04/23/15  Yes Maren Reamer, MD  metoprolol (LOPRESSOR) 50 MG tablet Take 1 tablet (50 mg total) by mouth 2 (two) times daily. 04/15/15  Yes Erin R Barrett, PA-C  rosuvastatin (CRESTOR) 10 MG tablet Take 1 tablet (10 mg total) by mouth daily. 04/23/15  Yes Maren Reamer, MD  HYDROcodone-acetaminophen (NORCO) 5-325 MG tablet Take 1 tablet by mouth every 6 (six) hours as needed for moderate pain. Patient not taking: Reported on 05/31/2015 05/15/15   Ivin Poot, MD  hydrOXYzine (ATARAX/VISTARIL) 25 MG tablet Take 1  tablet (25 mg total) by mouth 3 (three) times daily as needed for anxiety. Patient not taking: Reported on 05/31/2015 04/15/15   Erin R Barrett, PA-C  methocarbamol (ROBAXIN) 500 MG tablet Take 2 tablets (1,000 mg total) by mouth 4 (four) times daily. X7 days then prn muscle spasm 05/31/15   Argentina Donovan, PA-C     Objective:  EXAM:   Filed Vitals:   05/31/15 0925  BP: 113/68  Pulse: 71  Temp: 98 F (36.7 C)  TempSrc: Oral  Resp: 16  Height: 5' 5.5" (1.664 m)  Weight: 187 lb 9.6 oz (85.095 kg)  SpO2: 96%    General appearance : A&OX3. NAD. Non-toxic-appearing HEENT: Atraumatic and Normocephalic.  PERRLA. EOM intact.  TM clear B. No papilledema.  Fundi benign Mouth-MMM, post pharynx WNL w/o erythema, No PND. Neck: supple, no JVD. No cervical lymphadenopathy. No thyromegaly Chest/Lungs:  Breathing-non-labored, Good air entry bilaterally, breath sounds normal without rales, rhonchi, or wheezing  CVS: RRR without m/g/r  Back:  +paraspinus and trapezius spasm in upper back over upper thoracic spine.  +TTP.  No fluctuance or erythema. + spasm B into neck. Full S&ROM of neck.   Extremities: Bilateral Lower Ext shows no edema, both legs are warm to touch with = pulse throughout Neurology:  CN II-XII grossly intact, Non focal.   Psych:  TP linear. J/I WNL. Normal speech. Appropriate eye contact and affect.  Skin:  No Rash  Data Review Lab Results  Component Value Date   HGBA1C 8.0* 04/07/2015   HGBA1C 7.9* 04/06/2015     Assessment & Plan   1. Tension-type headache, not intractable, unspecified chronicity pattern - CBC with Differential/Platelet  2. Muscle spasm of back Robaxin 1000mg  qid X 7 days then prn muscle spasm.   Patient have been counseled extensively about nutrition and exercise.  She is making a lot of progress in dietary changes.   Return in about 4 weeks (around 06/28/2015).  The patient was given clear instructions to go to ER or return to medical center if  symptoms don't improve, worsen or new problems develop. The patient verbalized understanding. The patient was told to call to get lab results if they haven't heard anything in the next week.    Freeman Caldron, PA-C [facility] [place], [place]   05/31/2015, 9:31 AM

## 2015-05-31 NOTE — Patient Instructions (Signed)

## 2015-06-19 ENCOUNTER — Telehealth: Payer: Self-pay | Admitting: Internal Medicine

## 2015-06-19 NOTE — Telephone Encounter (Signed)
Patient needs lab results please follow up.

## 2015-06-19 NOTE — Telephone Encounter (Signed)
Refer to lab result note. 

## 2015-07-03 ENCOUNTER — Other Ambulatory Visit: Payer: Self-pay | Admitting: *Deleted

## 2015-07-03 MED ORDER — INSULIN GLULISINE 100 UNIT/ML SOLOSTAR PEN: 22.0000 [IU] | PEN_INJECTOR | Freq: Two times a day (BID) | SUBCUTANEOUS | Status: DC

## 2015-07-03 MED ORDER — INSULIN GLARGINE 100 UNITS/ML SOLOSTAR PEN: 30.0000 [IU] | PEN_INJECTOR | Freq: Every day | SUBCUTANEOUS | Status: DC

## 2015-07-03 NOTE — Telephone Encounter (Signed)
PASS PROGRAM 

## 2015-07-05 ENCOUNTER — Other Ambulatory Visit: Payer: Self-pay | Admitting: *Deleted

## 2015-07-05 DIAGNOSIS — J45909 Unspecified asthma, uncomplicated: Secondary | ICD-10-CM

## 2015-07-05 MED ORDER — ALBUTEROL SULFATE HFA 108 (90 BASE) MCG/ACT IN AERS: 1.0000 | INHALATION_SPRAY | Freq: Four times a day (QID) | RESPIRATORY_TRACT | Status: DC | PRN

## 2015-07-05 MED ORDER — FLUTICASONE-SALMETEROL 500-50 MCG/DOSE IN AEPB: 1.0000 | INHALATION_SPRAY | Freq: Two times a day (BID) | RESPIRATORY_TRACT | Status: DC

## 2015-07-05 MED ORDER — ALBUTEROL SULFATE HFA 108 (90 BASE) MCG/ACT IN AERS: 1.0000 | INHALATION_SPRAY | Freq: Four times a day (QID) | RESPIRATORY_TRACT | Status: AC | PRN

## 2015-07-05 MED FILL — **ADVAIR 500/50 DISKUS: 500-50 MCG | 28 days supply | Qty: 56 | Fill #0

## 2015-07-05 MED FILL — !VENTOLIN HFA INHALER: 108 (90 BAS | 28 days supply | Qty: 18 | Fill #0

## 2015-07-05 NOTE — Telephone Encounter (Signed)
PASS PROGRAM 

## 2015-07-10 MED FILL — METOPROLOL TARTRATE 50 MG T: 50 | 30 days supply | Qty: 60 | Fill #0

## 2015-07-10 MED FILL — ROSUVASTATIN CALCIUM 10 MG: 10 | 30 days supply | Qty: 30 | Fill #0

## 2015-07-10 MED FILL — CITALOPRAM HBR 20 MG TABLET: 20 | 30 days supply | Qty: 30 | Fill #0

## 2015-07-10 MED FILL — LISINOPRIL 5 MG TABLET: 5 | 30 days supply | Qty: 30 | Fill #0

## 2015-07-12 ENCOUNTER — Other Ambulatory Visit: Payer: Self-pay | Admitting: Pharmacist

## 2015-07-12 MED ORDER — TRUE METRIX METER W/DEVICE KIT: PACK | Status: AC

## 2015-07-12 MED ORDER — GLUCOSE BLOOD VI STRP: ORAL_STRIP | Status: AC

## 2015-07-12 MED ORDER — TRUEPLUS LANCETS 28G MISC: Status: AC

## 2015-08-07 ENCOUNTER — Ambulatory Visit: Payer: Self-pay | Admitting: Cardiovascular Disease

## 2015-08-07 NOTE — Progress Notes (Deleted)
Cardiology Office Note   Date:  08/07/2015   ID:  Kimberly [name], DOB 09/04/1965, MRN [id number]  PCP:  Maren Reamer, MD  Cardiologist:   Skeet Latch, MD  Cardiothoracic surgeon: Dr. Prescott Gum  No chief complaint on file.     History of Present Illness: Kimberly [name] is a 50 y.o. female CAD s/p CABG (LIMA-->LAD), hypertension, hyperlipidemia, diabetes, and asthma with who presents for follow up.  Kimberly [name] was seen in the ED 04/06/15 with exertional chest pain. Cardiac enzymes were mildly elevated and echocardiogram 04/08/15 revealed LVEF 50-55% with mild aortic regurgitation.  She subsequently underwent left heart catheterization and was found to have a 95% ostial to proximal LAD lesion. She will underwent coronary artery bypass grafting on 04/11/15 with a LIMA-->LAD.    At the time of hospitalization Kimberly [name] was living in a hotel. Arrangements were made for her to be placed in a skilled nursing facility after discharge.  She followed up with Dr. Prescott Gum on 5/3 and was doing well.    Past Medical History:  Diagnosis Date  . Asthma   . CAD (coronary artery disease) 04/06/2015   CABG with LIMA-LAD  . Diabetes mellitus without complication ([place])   . Myocardial infarction ([place])    Approx 2014 per pt report, no further details available    Past Surgical History:  Procedure Laterality Date  . ABDOMINAL HYSTERECTOMY    . APPENDECTOMY    . CARDIAC CATHETERIZATION N/A 04/08/2015   Procedure: Left Heart Cath and Coronary Angiography;  Surgeon: Jettie Booze, MD; LAD 95%, single vessel disease   . CORONARY ARTERY BYPASS GRAFT N/A 04/11/2015   Procedure: CORONARY ARTERY BYPASS GRAFTING (CABG) x one using left internal mamary artery. ;  Surgeon: Ivin Poot, MD;  LIMA-LAD  . TEE WITHOUT CARDIOVERSION N/A 04/11/2015   Procedure: TRANSESOPHAGEAL ECHOCARDIOGRAM (TEE);  Surgeon: Ivin Poot, MD;  Location: [facility];  Service: Open Heart Surgery;  Laterality: N/A;      Current Outpatient Prescriptions  Medication Sig Dispense Refill  . albuterol (PROVENTIL HFA;VENTOLIN HFA) 108 (90 Base) MCG/ACT inhaler Inhale 1 puff into the lungs every 6 (six) hours as needed for wheezing or shortness of breath. 54 g 3  . albuterol (PROVENTIL) (2.5 MG/3ML) 0.083% nebulizer solution Take 3 mLs (2.5 mg total) by nebulization every 6 (six) hours as needed for wheezing or shortness of breath. 75 mL 12  . amphetamine-dextroamphetamine (ADDERALL) 20 MG tablet Take 1 tablet (20 mg total) by mouth daily. 30 tablet 0  . aspirin 325 MG EC tablet Take 1 tablet (325 mg total) by mouth daily. 30 tablet 0  . Blood Glucose Monitoring Suppl (TRUE METRIX METER) w/Device KIT Use as directed 1 kit 0  . citalopram (CELEXA) 20 MG tablet Take 1 tablet (20 mg total) by mouth daily. 30 tablet 3  . Fluticasone-Salmeterol (ADVAIR) 500-50 MCG/DOSE AEPB Inhale 1 puff into the lungs 2 (two) times daily. 180 each 3  . glucose blood (TRUE METRIX BLOOD GLUCOSE TEST) test strip Use as instructed 100 each 12  . HYDROcodone-acetaminophen (NORCO) 5-325 MG tablet Take 1 tablet by mouth every 6 (six) hours as needed for moderate pain. (Patient not taking: Reported on 05/31/2015) 40 tablet 0  . hydrOXYzine (ATARAX/VISTARIL) 25 MG tablet Take 1 tablet (25 mg total) by mouth 3 (three) times daily as needed for anxiety. (Patient not taking: Reported on 05/31/2015) 30 tablet 0  . insulin glargine (LANTUS) 100 unit/mL SOPN Inject 0.3  mLs (30 Units total) into the skin daily after breakfast. 30 mL 3  . Insulin Glulisine (APIDRA SOLOSTAR) 100 UNIT/ML Solostar Pen Inject 22 Units into the skin 2 (two) times daily after a meal. 30 mL 3  . lisinopril (PRINIVIL,ZESTRIL) 5 MG tablet Take 1 tablet (5 mg total) by mouth daily. 30 tablet 3  . Melatonin 3 MG TABS Take 1 tablet (3 mg total) by mouth at bedtime as needed. 30 tablet 0  . methocarbamol (ROBAXIN) 500 MG tablet Take 2 tablets (1,000 mg total) by mouth 4 (four) times  daily. X7 days then prn muscle spasm 120 tablet 0  . metoprolol (LOPRESSOR) 50 MG tablet Take 1 tablet (50 mg total) by mouth 2 (two) times daily. 60 tablet 3  . rosuvastatin (CRESTOR) 10 MG tablet Take 1 tablet (10 mg total) by mouth daily. 30 tablet 3  . TRUEPLUS LANCETS 28G MISC Use as directed 100 each 12   No current facility-administered medications for this visit.     Allergies:   Strattera [atomoxetine hcl] and Pyridium [phenazopyridine hcl]    Social History:  The patient  reports that she has quit smoking. Her smoking use included Cigarettes. She does not have any smokeless tobacco history on file. She reports that she drinks alcohol. She reports that she does not use drugs.   Family History:  The patient's ***family history is not on file.    ROS:  Please see the history of present illness.   Otherwise, review of systems are positive for {NONE DEFAULTED:18576::"none"}.   All other systems are reviewed and negative.    PHYSICAL EXAM: VS:  There were no vitals taken for this visit. , BMI There is no height or weight on file to calculate BMI. GENERAL:  Well appearing HEENT:  Pupils equal round and reactive, fundi not visualized, oral mucosa unremarkable NECK:  No jugular venous distention, waveform within normal limits, carotid upstroke brisk and symmetric, no bruits, no thyromegaly LYMPHATICS:  No cervical adenopathy LUNGS:  Clear to auscultation bilaterally HEART:  RRR.  PMI not displaced or sustained,S1 and S2 within normal limits, no S3, no S4, no clicks, no rubs, *** murmurs ABD:  Flat, positive bowel sounds normal in frequency in pitch, no bruits, no rebound, no guarding, no midline pulsatile mass, no hepatomegaly, no splenomegaly EXT:  2 plus pulses throughout, no edema, no cyanosis no clubbing SKIN:  No rashes no nodules NEURO:  Cranial nerves II through XII grossly intact, motor grossly intact throughout PSYCH:  Cognitively intact, oriented to person place and  time    EKG:  EKG {ACTION; IS/IS ZYS:[id number]} ordered today. The ekg ordered today demonstrates ***  Echo 04/08/15: Study Conclusions  - Left ventricle: Distal septal hypokinesis The cavity size was   mildly dilated. Wall thickness was normal. Systolic function was   normal. The estimated ejection fraction was in the range of 50%   to 55%. Doppler parameters are consistent with elevated   ventricular end-diastolic filling pressure. - Aortic valve: There was mild regurgitation. - Atrial septum: No defect or patent foramen ovale was identified.  LHC 04/08/15:  95% ostial to proximal LAD Tortuous R subclavian  Recent Labs: 04/09/2015: TSH 0.654 04/12/2015: Magnesium 1.6 04/14/2015: BUN 7; Creatinine, Ser 0.68; Potassium 3.7; Sodium 137 05/31/2015: Hemoglobin 11.8; Platelets 367    Lipid Panel    Component Value Date/Time   CHOL 204 (H) 04/06/2015 1816   TRIG 266 (H) 04/06/2015 1816   HDL 48 04/06/2015 1816   CHOLHDL  4.3 04/06/2015 1816   VLDL 53 (H) 04/06/2015 1816   LDLCALC 103 (H) 04/06/2015 1816      Wt Readings from Last 3 Encounters:  05/31/15 187 lb 9.6 oz (85.1 kg)  05/15/15 183 lb (83 kg)  05/06/15 183 lb 6 oz (83.2 kg)      ASSESSMENT AND PLAN:  ***   Current medicines are reviewed at length with the patient today.  The patient {ACTIONS; HAS/DOES NOT HAVE:19233} concerns regarding medicines.  The following changes have been made:  {PLAN; NO CHANGE:13088:s}  Labs/ tests ordered today include: *** No orders of the defined types were placed in this encounter.    Disposition:   FU with ***    This note was written with the assistance of speech recognition software.  Please excuse any transcriptional errors.  Signed, Prapti Grussing C. Oval Linsey, MD, [facility]  08/07/2015 8:00 AM    [facility]

## 2015-08-08 ENCOUNTER — Encounter: Payer: Self-pay | Admitting: *Deleted

## 2015-09-05 MED FILL — ?LISINOPRIL 5 MG TABLET: 5 | 30 days supply | Qty: 30 | Fill #1

## 2015-09-05 MED FILL — ROSUVASTATIN CALCIUM 10 MG: 10 | 30 days supply | Qty: 30 | Fill #1

## 2015-09-05 MED FILL — !LANTUS SOLOSTAR 100UNITS/M: 100 | 20 days supply | Qty: 6 | Fill #1

## 2015-09-05 MED FILL — METHOCARBAMOL 500 MG TABLET: 500 | 15 days supply | Qty: 120 | Fill #0

## 2015-09-05 MED FILL — !APIDRA SOLOSTAR 100 UNITS/: 100 UNITS/M | 34 days supply | Qty: 15 | Fill #0

## 2015-09-05 MED FILL — TRUE METRIX TEST STRIP: 25 days supply | Qty: 100 | Fill #0

## 2015-12-16 ENCOUNTER — Encounter

## 2015-12-16 ENCOUNTER — Inpatient Hospital Stay: Admit: 2015-12-16 | Payer: Self-pay | Primary: Internal Medicine

## 2015-12-16 DIAGNOSIS — M19011 Primary osteoarthritis, right shoulder: Secondary | ICD-10-CM

## 2017-04-05 ENCOUNTER — Ambulatory Visit: Attending: Internal Medicine | Primary: Internal Medicine

## 2017-04-05 ENCOUNTER — Ambulatory Visit
Admit: 2017-04-05 | Discharge: 2017-04-05 | Payer: PRIVATE HEALTH INSURANCE | Attending: Internal Medicine | Primary: Internal Medicine

## 2017-04-05 DIAGNOSIS — E119 Type 2 diabetes mellitus without complications: Secondary | ICD-10-CM

## 2017-04-05 NOTE — Patient Instructions (Signed)
Sliding scale  for rapidly acting insulin      Blood sugar                        Insulin dose(units)    0-100                                          0  101-150                                      3  151-200                                      6  201- 250                                     9  251-300                                     12  301- 350                                    15.

## 2017-04-05 NOTE — Progress Notes (Signed)
Chief Complaint   Patient presents with   ??? Establish Care   ??? Complete Physical     had a mini bagel this am   ??? Diabetes   ??? Hypertension   ??? Coronary Artery Disease     1. Have you been to the ER, urgent care clinic since your last visit?  Hospitalized since your last visit?No    2. Have you seen or consulted any other health care providers outside [facility] the [facility] since your last visit?  Include any pap smears or colon screening. No

## 2017-04-05 NOTE — Progress Notes (Signed)
Cathy PlaterKecia Presti is a 52 y.o.  female and presents with     Chief Complaint   Patient presents with   ??? Establish Care   ??? Complete Physical     had a mini bagel this am   ??? Diabetes   ??? Hypertension   ??? Coronary Artery Disease   ??? Cholesterol Problem   ??? Behavioral Problem   ??? Depression     Pt is here to establish  Care.  Pt has h/o DM, HTN, hyperchol, CAD, ADHD, depression,  Pt does smoke cigarettes , couple per day.  Pt drink wine on the weekends.  Pt has had DM for over 8 years.  Pt had CABG in 2016.The main artery was blocked 98 %.  [place]  Pt is taking Lantus 40 units daily.  Pt takes short acting insulin 10 units three times daily.  Last A1c - 11.6.  FBG-[id number],[id number],79,183  BD - 92,  Pts both parents had DM.            Past Medical History:   Diagnosis Date   ??? Asthma    ??? Depression    ??? Diabetes (HCC)     Type 2 DM   ??? Hypercholesterolemia    ??? Hypertension      Past Surgical History:   Procedure Laterality Date   ??? HX ACL RECONSTRUCTION      right knee   ??? HX APPENDECTOMY     ??? HX ARTERIAL BYPASS      left main artery bypass   ??? HX HYSTERECTOMY       Current Outpatient Medications   Medication Sig   ??? dextroamphetamine-amphetamine (ADDERALL) 20 mg tablet Take  by mouth.   ??? Blood-Glucose Meter (TRUE METRIX GLUCOSE METER) misc Use as directed   ??? melatonin 3 mg tablet Take  by mouth.   ??? albuterol (PROVENTIL HFA) 90 mcg/actuation inhaler Take 1 Puff by inhalation.   ??? albuterol (PROVENTIL VENTOLIN) 2.5 mg /3 mL (0.083 %) nebulizer solution Take 2.5 mg by inhalation.   ??? aspirin delayed-release 325 mg tablet Take 325 mg by mouth.   ??? glucose blood VI test strips (TRUE METRIX GLUCOSE TEST STRIP) strip Use as instructed   ??? FLUoxetine (PROZAC) 20 mg capsule    ??? hydrOXYzine HCl (ATARAX) 25 mg tablet Take 25 mg by mouth.   ??? insulin glargine (LANTUS SOLOSTAR U-100 INSULIN) 100 unit/mL (3 mL) inpn 40 Units by SubCUTAneous route.   ??? lancets (TRUEPLUS LANCETS) 28 gauge misc Use as directed    ??? lisinopril (PRINIVIL, ZESTRIL) 5 mg tablet Take  by mouth.   ??? metoprolol tartrate (LOPRESSOR) 50 mg tablet Take 50 mg by mouth.   ??? rosuvastatin (CRESTOR) 10 mg tablet Take 10 mg by mouth.   ??? traZODone (DESYREL) 100 mg tablet Take  by mouth.   ??? acetaminophen-codeine (TYLENOL #3) 300-30 mg per tablet      No current facility-administered medications for this visit.      Health Maintenance   Topic Date Due   ??? Hepatitis C Screening  Mar 11, 1965   ??? DTaP/Tdap/Td series (1 - Tdap) 09/13/1984   ??? Shingrix Vaccine Age 80> (1 of 2) 09/13/2013   ??? BREAST CANCER SCRN MAMMOGRAM  09/13/2013   ??? FOBT Q 1 YEAR AGE 60-75  09/13/2013   ??? Influenza Age 249 to Adult  09/10/2017 (Originally 08/12/2016)   ??? PAP AKA CERVICAL CYTOLOGY  03/06/2019   ??? Pneumococcal 0-64  years  Aged Out       There is no immunization history on file for this patient.  No LMP recorded. Patient has had a hysterectomy.        Allergies and Intolerances:   Allergies   Allergen Reactions   ??? Pyridium [Phenazopyridine] Itching       Family History:   No family history on file.    Social History:   She  reports that she has been smoking.  She has a 10.00 pack-year smoking history. She has quit using smokeless tobacco.  She  reports that she drinks about 2.4 oz of alcohol per week.            Review of Systems:   General: negative for - chills, fatigue, fever, weight change  Psych: negative for - anxiety, depression, irritability or mood swings  ENT: negative for - headaches, hearing change, nasal congestion, oral lesions, sneezing or sore throat  Heme/ Lymph: negative for - bleeding problems, bruising, pallor or swollen lymph nodes  Endo: negative for - hot flashes, polydipsia/polyuria or temperature intolerance  Resp: negative for - cough, shortness of breath or wheezing  CV: negative for - chest pain, edema or palpitations  GI: negative for - abdominal pain, change in bowel habits, constipation, diarrhea or nausea/vomiting   GU: negative for - dysuria, hematuria, incontinence, pelvic pain or vulvar/vaginal symptoms  MSK: negative for - joint pain, joint swelling or muscle pain  Neuro: negative for - confusion, headaches, seizures or weakness  Derm: negative for - dry skin, hair changes, rash or skin lesion changes          Physical:   Vitals:   Vitals:    04/05/17 1108   BP: 117/70   Pulse: (!) 58   Resp: 16   Temp: 98.3 ??F (36.8 ??C)   TempSrc: Oral   SpO2: 100%   Weight: 187 lb 3.2 oz (84.9 kg)   Height: 5\' 6"  (1.676 m)           Exam:   HEENT- atraumatic,normocephalic, awake, oriented, well nourished,mild pallor, tongue dry   Neck - supple,no enlarged lymph nodes, no JVD, no thyromegaly  Chest- CTA, no rhonchi, no crackles  Heart- rrr, no murmurs / gallop/rub  Abdomen- soft,BS+,NT, no hepatosplenomegaly, epiagstric tenderness +  Ext - no c/c/edema   Neuro- no focal deficits.Power 5/5 all extremities  Skin - warm,dry, no obvious rashes.          Review of Data:   LABS:   No results found for: WBC, HGB, HCT, PLT, HGBEXT, HCTEXT, PLTEXT, HGBEXT, HCTEXT, PLTEXT  No results found for: NA, K, CL, CO2, GLU, BUN, CREA  No results found for: CHOL, CHOLX, CHLST, CHOLV, HDL, LDL, LDLC, DLDLP, TGLX, TRIGL, TRIGP  No results found for: GPT        Impression / Plan:        ICD-10-CM ICD-9-CM    1. DM type 2, goal HbA1c < 7% (HCC) E11.9 250.00 CBC WITH AUTOMATED DIFF      METABOLIC PANEL, COMPREHENSIVE      LIPID PANEL      HEMOGLOBIN A1C WITH EAG      MICROALBUMIN, UR, RAND W/ MICROALB/CREAT RATIO      TSH 3RD GENERATION      REFERRAL TO DIETITIAN   2. Essential hypertension I10 401.9    3. Coronary artery disease involving native coronary artery of native heart without angina pectoris I25.10 414.01    4. Current mild  episode of major depressive disorder, unspecified whether recurrent (HCC) F32.0 296.21    5. Attention deficit hyperactivity disorder (ADHD), unspecified ADHD type F90.9 314.01    6. Hypercholesteremia E78.00 272.0     7. Mild intermittent asthma without complication J45.20 493.90    8. Smoker F17.200 305.1    9. Vitamin D deficiency E55.9 268.9 VITAMIN D, 1, 25 DIHYDROXY   10. Colon cancer screening Z12.11 V76.51 REFERRAL TO GASTROENTEROLOGY   11. History of ovarian cancer Z85.43 V10.43        Sliding scale  for rapidly acting insulin      Blood sugar                        Insulin dose(units)    0-100                                          0  101-150                                      3  151-200                                      6  201- 250                                     9  251-300                                     12  301- 350                                    15.      Monitor sugars three times daily , beefore breakfast , lunch and dinner      Pt has appt with Dentist, Dr Clemetine Marker , Dr Allena Katz, shoulder , elbow,   Cardiology - Dr Francesca Jewett  Pt is getting PT  [facility] - scheduled for April 3rd  Opthal -   Has appt for stress test  At [facility].    Explained to patient risk benefits of the medications.Advised patient to stop meds if having any side effects.Pt verbalized understanding of the instructions.    I have discussed the diagnosis with the patient and the intended plan as seen in the above orders.  The patient has received an after-visit summary and questions were answered concerning future plans.  I have discussed medication side effects and warnings with the patient as well. I have reviewed the plan of care with the patient, accepted their input and they are in agreement with the treatment goals.     Reviewed plan of care. Patient has provided input and agrees with goals.        Jenene Slicker, MD

## 2017-04-06 LAB — MICROALBUMIN, UR, RAND W/ MICROALB/CREAT RATIO
Creatinine, urine random: 169 mg/dL
Microalb/Creat ratio (ug/mg creat.): 7 mg/g creat (ref 0.0–30.0)
Microalbumin, urine: 11.8 ug/mL

## 2017-04-06 LAB — CBC WITH AUTOMATED DIFF
ABS. BASOPHILS: 0 10*3/uL (ref 0.0–0.2)
ABS. EOSINOPHILS: 0.1 10*3/uL (ref 0.0–0.4)
ABS. IMM. GRANS.: 0 10*3/uL (ref 0.0–0.1)
ABS. MONOCYTES: 0.3 10*3/uL (ref 0.1–0.9)
ABS. NEUTROPHILS: 3.3 10*3/uL (ref 1.4–7.0)
Abs Lymphocytes: 3.3 10*3/uL — ABNORMAL HIGH (ref 0.7–3.1)
BASOPHILS: 0 %
EOSINOPHILS: 2 %
HCT: 40.7 % (ref 34.0–46.6)
HGB: 13.5 g/dL (ref 11.1–15.9)
IMMATURE GRANULOCYTES: 0 %
Lymphocytes: 47 %
MCH: 29 pg (ref 26.6–33.0)
MCHC: 33.2 g/dL (ref 31.5–35.7)
MCV: 88 fL (ref 79–97)
MONOCYTES: 5 %
NEUTROPHILS: 46 %
PLATELET: 312 10*3/uL (ref 150–379)
RBC: 4.65 x10E6/uL (ref 3.77–5.28)
RDW: 14 % (ref 12.3–15.4)
WBC: 7.1 10*3/uL (ref 3.4–10.8)

## 2017-04-06 LAB — METABOLIC PANEL, COMPREHENSIVE
A-G Ratio: 1.7 (ref 1.2–2.2)
ALT (SGPT): 31 IU/L (ref 0–32)
AST (SGOT): 17 IU/L (ref 0–40)
Albumin: 4.6 g/dL (ref 3.5–5.5)
Alk. phosphatase: 87 IU/L (ref 39–117)
BUN/Creatinine ratio: 19 (ref 9–23)
BUN: 14 mg/dL (ref 6–24)
Bilirubin, total: 0.5 mg/dL (ref 0.0–1.2)
CO2: 20 mmol/L (ref 20–29)
Calcium: 9.1 mg/dL (ref 8.7–10.2)
Chloride: 107 mmol/L — ABNORMAL HIGH (ref 96–106)
Creatinine: 0.73 mg/dL (ref 0.57–1.00)
GFR est AA: 109 mL/min/{1.73_m2} (ref >59)
GFR est non-AA: 94 mL/min/{1.73_m2} (ref >59)
GLOBULIN, TOTAL: 2.7 g/dL (ref 1.5–4.5)
Glucose: 152 mg/dL — ABNORMAL HIGH (ref 65–99)
Potassium: 4.5 mmol/L (ref 3.5–5.2)
Protein, total: 7.3 g/dL (ref 6.0–8.5)
Sodium: 142 mmol/L (ref 134–144)

## 2017-04-06 LAB — HEMOGLOBIN A1C WITH EAG
Estimated average glucose: 235 mg/dL
Hemoglobin A1c: 9.8 % — ABNORMAL HIGH (ref 4.8–5.6)

## 2017-04-06 LAB — LIPID PANEL
Cholesterol, total: 160 mg/dL (ref 100–199)
HDL Cholesterol: 61 mg/dL (ref >39)
LDL, calculated: 77 mg/dL (ref 0–99)
Triglyceride: 112 mg/dL (ref 0–149)
VLDL, calculated: 22 mg/dL (ref 5–40)

## 2017-04-06 LAB — TSH 3RD GENERATION: TSH: 0.765 u[IU]/mL (ref 0.450–4.500)

## 2017-04-06 LAB — VITAMIN D, 1, 25 DIHYDROXY: Calcitriol (Vit D 1, 25 di-OH): 49.3 pg/mL (ref 19.9–79.3)

## 2017-04-08 ENCOUNTER — Encounter

## 2017-04-09 ENCOUNTER — Ambulatory Visit
Admit: 2017-04-09 | Discharge: 2017-04-09 | Payer: PRIVATE HEALTH INSURANCE | Attending: Internal Medicine | Primary: Internal Medicine

## 2017-04-09 DIAGNOSIS — E119 Type 2 diabetes mellitus without complications: Secondary | ICD-10-CM

## 2017-04-09 NOTE — Progress Notes (Signed)
Cathy [name] is a 52 y.o.  female and presents with     Chief Complaint   Patient presents with   ??? Pre-op Exam     teeth extractions, April 26,19   ??? Diabetes   ??? Hypertension   ??? Cholesterol Problem   ??? Coronary Artery Disease   ??? Shortness of Breath     Pt is here for follow up on labs.  Pt says she has been following the sliding scale and the blood sugars have improved.  Pt had some mild shortness of breath. She saw cardiology who has ordered stress test next week.  She is supposed to get dental extractions done in 3 rd week of April.She is taking her blood and chol meds.          Past Medical History:   Diagnosis Date   ??? Asthma    ??? Depression    ??? Diabetes (HCC)     Type 2 DM   ??? Hypercholesterolemia    ??? Hypertension      Past Surgical History:   Procedure Laterality Date   ??? HX ACL RECONSTRUCTION      right knee   ??? HX APPENDECTOMY     ??? HX ARTERIAL BYPASS      left main artery bypass   ??? HX HYSTERECTOMY       Current Outpatient Medications   Medication Sig   ??? dextroamphetamine-amphetamine (ADDERALL) 20 mg tablet Take 20 mg by mouth two (2) times a day.   ??? albuterol (PROVENTIL HFA) 90 mcg/actuation inhaler Take 1 Puff by inhalation.   ??? albuterol (PROVENTIL VENTOLIN) 2.5 mg /3 mL (0.083 %) nebulizer solution Take 2.5 mg by inhalation.   ??? aspirin delayed-release 325 mg tablet Take 325 mg by mouth.   ??? FLUoxetine (PROZAC) 20 mg capsule Take 20 mg by mouth daily.   ??? hydrOXYzine HCl (ATARAX) 25 mg tablet Take 25 mg by mouth daily.   ??? insulin glargine (LANTUS SOLOSTAR U-100 INSULIN) 100 unit/mL (3 mL) inpn 40 Units by SubCUTAneous route nightly.   ??? lisinopril (PRINIVIL, ZESTRIL) 5 mg tablet Take 5 mg by mouth daily.   ??? metoprolol tartrate (LOPRESSOR) 50 mg tablet Take 50 mg by mouth two (2) times a day.   ??? rosuvastatin (CRESTOR) 10 mg tablet Take 10 mg by mouth.   ??? traZODone (DESYREL) 100 mg tablet Take 100 mg by mouth nightly as needed.    ??? Blood-Glucose Meter (TRUE METRIX GLUCOSE METER) misc Use as directed   ??? melatonin 3 mg tablet Take 3 mg by mouth nightly.   ??? glucose blood VI test strips (TRUE METRIX GLUCOSE TEST STRIP) strip Use as instructed   ??? lancets (TRUEPLUS LANCETS) 28 gauge misc Use as directed     No current facility-administered medications for this visit.      Health Maintenance   Topic Date Due   ??? Hepatitis C Screening  1965-08-13   ??? Pneumococcal 0-64 years (1 of 1 - PPSV23) 09/13/1969   ??? FOOT EXAM Q1  09/13/1973   ??? EYE EXAM RETINAL OR DILATED  09/13/1973   ??? DTaP/Tdap/Td series (1 - Tdap) 09/13/1984   ??? Shingrix Vaccine Age 560> (1 of 2) 09/13/2013   ??? BREAST CANCER SCRN MAMMOGRAM  09/13/2013   ??? FOBT Q 1 YEAR AGE 36-75  09/13/2013   ??? Influenza Age 56 to Adult  09/10/2017 (Originally 08/12/2016)   ??? HEMOGLOBIN A1C Q6M  10/06/2017   ??? MICROALBUMIN Q1  04/06/2018   ??? LIPID PANEL Q1  04/06/2018   ??? PAP AKA CERVICAL CYTOLOGY  03/06/2019       There is no immunization history on file for this patient.  No LMP recorded. Patient has had a hysterectomy.        Allergies and Intolerances:   Allergies   Allergen Reactions   ??? Pyridium [Phenazopyridine] Itching       Family History:   No family history on file.    Social History:   She  reports that she has been smoking.  She has a 10.00 pack-year smoking history. She has quit using smokeless tobacco.  She  reports that she drinks about 2.4 oz of alcohol per week.            Review of Systems:   General: negative for - chills, fatigue, fever, weight change  Psych: negative for - anxiety, depression, irritability or mood swings  ENT: negative for - headaches, hearing change, nasal congestion, oral lesions, sneezing or sore throat  Heme/ Lymph: negative for - bleeding problems, bruising, pallor or swollen lymph nodes  Endo: negative for - hot flashes, polydipsia/polyuria or temperature intolerance  Resp: negative for - cough, shortness of breath or wheezing   CV: negative for - chest pain, edema or palpitations  GI: negative for - abdominal pain, change in bowel habits, constipation, diarrhea or nausea/vomiting  GU: negative for - dysuria, hematuria, incontinence, pelvic pain or vulvar/vaginal symptoms  MSK: negative for - joint pain, joint swelling or muscle pain  Neuro: negative for - confusion, headaches, seizures or weakness  Derm: negative for - dry skin, hair changes, rash or skin lesion changes          Physical:   Vitals:   Vitals:    04/09/17 1336   BP: 117/72   Pulse: 85   Resp: 17   Temp: 98.2 ??F (36.8 ??C)   TempSrc: Oral   SpO2: 100%   Weight: 186 lb (84.4 kg)   Height: 5\' 6"  (1.676 m)           Exam:   HEENT- atraumatic,normocephalic, awake, oriented, well nourished  Neck - supple,no enlarged lymph nodes, no JVD, no thyromegaly  Chest- CTA, no rhonchi, no crackles  Heart- rrr, no murmurs / gallop/rub  Abdomen- soft,BS+,NT, no hepatosplenomegaly  Ext - no c/c/edema   Neuro- no focal deficits.Power 5/5 all extremities  Skin - warm,dry, no obvious rashes.          Review of Data:   LABS:   Lab Results   Component Value Date/Time    WBC 7.1 04/05/2017 12:32 PM    HGB 13.5 04/05/2017 12:32 PM    HCT 40.7 04/05/2017 12:32 PM    PLATELET 312 04/05/2017 12:32 PM     Lab Results   Component Value Date/Time    Sodium 142 04/05/2017 12:32 PM    Potassium 4.5 04/05/2017 12:32 PM    Chloride 107 (H) 04/05/2017 12:32 PM    CO2 20 04/05/2017 12:32 PM    Glucose 152 (H) 04/05/2017 12:32 PM    BUN 14 04/05/2017 12:32 PM    Creatinine 0.73 04/05/2017 12:32 PM     Lab Results   Component Value Date/Time    Cholesterol, total 160 04/05/2017 12:32 PM    HDL Cholesterol 61 04/05/2017 12:32 PM    LDL, calculated 77 04/05/2017 12:32 PM    Triglyceride 112 04/05/2017 12:32 PM     No results found for: GPT  Impression / Plan:        ICD-10-CM ICD-9-CM    1. DM type 2, goal HbA1c < 7% (HCC) E11.9 250.00    2. Essential hypertension I10 401.9     3. Coronary artery disease involving native coronary artery of native heart without angina pectoris I25.10 414.01    4. Hypercholesteremia E78.00 272.0      DM - sugars improving    HTN/Hyperchol - stable    CAd - has stress test scheduled by cardiology for symptoms of SOB. She had CABG in 2016.  Symptoms are atypical in nature.      Explained to patient risk benefits of the medications.Advised patient to stop meds if having any side effects.Pt verbalized understanding of the instructions.    I have discussed the diagnosis with the patient and the intended plan as seen in the above orders.  The patient has received an after-visit summary and questions were answered concerning future plans.  I have discussed medication side effects and warnings with the patient as well. I have reviewed the plan of care with the patient, accepted their input and they are in agreement with the treatment goals.     Reviewed plan of care. Patient has provided input and agrees with goals.        Jenene Slicker, MD

## 2017-04-13 ENCOUNTER — Encounter: Primary: Internal Medicine

## 2017-04-19 ENCOUNTER — Encounter: Payer: Self-pay | Admitting: Cardiovascular Disease

## 2017-04-26 ENCOUNTER — Ambulatory Visit
Admit: 2017-04-26 | Discharge: 2017-04-26 | Payer: PRIVATE HEALTH INSURANCE | Attending: Internal Medicine | Primary: Internal Medicine

## 2017-04-26 DIAGNOSIS — Z0000 Encounter for general adult medical examination without abnormal findings: Secondary | ICD-10-CM

## 2017-04-26 MED ORDER — PNEUMOCOCCAL 23-VALPS VACCINE 25 MCG/0.5 ML INJECTION
25 mcg/0.5 mL | Freq: Once | INTRAMUSCULAR | 0 refills | Status: DC
Start: 2017-04-26 — End: 2017-04-26

## 2017-04-26 MED ORDER — DICLOFENAC 1 % TOPICAL GEL
1 % | Freq: Four times a day (QID) | CUTANEOUS | 3 refills | Status: DC
Start: 2017-04-26 — End: 2018-01-24

## 2017-04-26 MED ORDER — LISINOPRIL 10 MG TAB
10 mg | ORAL_TABLET | Freq: Every day | ORAL | 1 refills | Status: DC
Start: 2017-04-26 — End: 2017-10-14

## 2017-04-26 NOTE — Progress Notes (Signed)
Chief Complaint   Patient presents with   ??? Complete Physical     labs done prior     1. Have you been to the ER, urgent care clinic since your last visit?  Hospitalized since your last visit?No    2. Have you seen or consulted any other health care providers outside of the [facility] since your last visit?  Include any pap smears or colon screening. No

## 2017-04-26 NOTE — Progress Notes (Signed)
Cathy PlaterKecia [name] is a 52 y.o.  female and presents with     Chief Complaint   Patient presents with   ??? Complete Physical     labs done prior   ??? Diabetes   ??? Coronary Artery Disease   ??? Hypertension   ??? Pre-op Exam     Pt is here for a physical.  Pt informs that her blood sugars have improved.  Only once did her blood sugar go above 200.  She checks it three times daily  She is supposed to get teeth pulled next week.  She denies any chest pain, SOB, orthopnoea, PND, leg swelling  She recently had [facility] test [facility] and it was neg for ischemia.  Pt is taking lsinopril 5 mg po daily.  She says she already had [facility], [facility].    Denies any abnormal bleeding  Fever or chills.  She has rt shoulder pain. She sees Ortho. She is getting PT.  She says lidocaien patches are not helping her.  She sees Psych for depression.      Past Medical History:   Diagnosis Date   ??? Asthma    ??? Depression    ??? Diabetes (HCC)     Type 2 DM   ??? Hypercholesterolemia    ??? Hypertension      Past Surgical History:   Procedure Laterality Date   ??? HX ACL RECONSTRUCTION      right knee   ??? HX APPENDECTOMY     ??? HX ARTERIAL BYPASS      left main artery bypass   ??? HX HYSTERECTOMY       Current Outpatient Medications   Medication Sig   ??? insulin aspart U-100 (NOVOLOG FLEXPEN U-100 INSULIN) 100 unit/mL inpn   SQ, ac tid, 0 Refills   ??? diclofenac (VOLTAREN) 1 % gel Apply 4 g to affected area four (4) times daily.   ??? lisinopril (PRINIVIL, ZESTRIL) 10 mg tablet Take 1 Tab by mouth daily.   ??? dextroamphetamine-amphetamine (ADDERALL) 20 mg tablet Take 20 mg by mouth two (2) times a day.   ??? melatonin 3 mg tablet Take 3 mg by mouth nightly.   ??? albuterol (PROVENTIL HFA) 90 mcg/actuation inhaler Take 1 Puff by inhalation.   ??? albuterol (PROVENTIL VENTOLIN) 2.5 mg /3 mL (0.083 %) nebulizer solution Take 2.5 mg by inhalation.   ??? aspirin delayed-release 325 mg tablet Take 325 mg by mouth.   ??? FLUoxetine (PROZAC) 20 mg capsule Take 20 mg by mouth daily.    ??? hydrOXYzine HCl (ATARAX) 25 mg tablet Take 25 mg by mouth daily.   ??? insulin glargine (LANTUS SOLOSTAR U-100 INSULIN) 100 unit/mL (3 mL) inpn 40 Units by SubCUTAneous route nightly.   ??? metoprolol tartrate (LOPRESSOR) 50 mg tablet Take 50 mg by mouth two (2) times a day.   ??? traZODone (DESYREL) 100 mg tablet Take 100 mg by mouth nightly as needed.   ??? Blood-Glucose Meter (TRUE METRIX GLUCOSE METER) misc Use as directed   ??? glucose blood VI test strips (TRUE METRIX GLUCOSE TEST STRIP) strip Use as instructed   ??? lancets (TRUEPLUS LANCETS) 28 gauge misc Use as directed   ??? rosuvastatin (CRESTOR) 10 mg tablet Take 10 mg by mouth.     No current facility-administered medications for this visit.      Health Maintenance   Topic Date Due   ??? Hepatitis C Screening  May 11, 1965   ??? Pneumococcal 0-64 years (1  of 1 - PPSV23) 09/13/1969   ??? FOOT EXAM Q1  09/13/1973   ??? EYE EXAM RETINAL OR DILATED  09/13/1973   ??? DTaP/Tdap/Td series (1 - Tdap) 09/13/1984   ??? Shingrix Vaccine Age 43> (1 of 2) 09/13/2013   ??? BREAST CANCER SCRN MAMMOGRAM  09/13/2013   ??? FOBT Q 1 YEAR AGE 2-75  09/13/2013   ??? Influenza Age 53 to Adult  09/10/2017 (Originally 08/12/2017)   ??? HEMOGLOBIN A1C Q6M  10/06/2017   ??? MICROALBUMIN Q1  04/06/2018   ??? LIPID PANEL Q1  04/06/2018   ??? PAP AKA CERVICAL CYTOLOGY  03/06/2019     Immunization History   Administered Date(s) Administered   ??? Pneumococcal Polysaccharide (PPSV-23) 04/26/2017     No LMP recorded. Patient has had a hysterectomy.        Allergies and Intolerances:   Allergies   Allergen Reactions   ??? Pyridium [Phenazopyridine] Itching       Family History:   No family history on file.    Social History:   She  reports that she has been smoking.  She has a 10.00 pack-year smoking history. She has quit using smokeless tobacco.  She  reports that she drinks about 2.4 oz of alcohol per week.            Review of Systems:   General: negative for - chills, fatigue, fever, weight change   Psych: negative for - anxiety, depression, irritability or mood swings  ENT: negative for - headaches, hearing change, nasal congestion, oral lesions, sneezing or sore throat  Heme/ Lymph: negative for - bleeding problems, bruising, pallor or swollen lymph nodes  Endo: negative for - hot flashes, polydipsia/polyuria or temperature intolerance  Resp: negative for - cough, shortness of breath or wheezing  CV: negative for - chest pain, edema or palpitations  GI: negative for - abdominal pain, change in bowel habits, constipation, diarrhea or nausea/vomiting  GU: negative for - dysuria, hematuria, incontinence, pelvic pain or vulvar/vaginal symptoms  MSK: negative for - joint pain, joint swelling or muscle pain  Neuro: negative for - confusion, headaches, seizures or weakness  Derm: negative for - dry skin, hair changes, rash or skin lesion changes          Physical:   Vitals:   Vitals:    04/26/17 1254   BP: 146/79   Pulse: 65   Resp: 16   Temp: 97.9 ??F (36.6 ??C)   TempSrc: Oral   SpO2: 100%   Weight: 188 lb 6.4 oz (85.5 kg)   Height: 5\' 6"  (1.676 m)           Exam:   HEENT- atraumatic,normocephalic, awake, oriented, well nourished  Neck - supple,no enlarged lymph nodes, no JVD, no thyromegaly  Chest- CTA, no rhonchi, no crackles  Heart- rrr, no murmurs / gallop/rub  Abdomen- soft,BS+,NT, no hepatosplenomegaly  Ext - no c/c/edema   Neuro- no focal deficits.Power 5/5 all extremities  Skin - warm,dry, no obvious rashes.          Review of Data:   LABS:   Lab Results   Component Value Date/Time    WBC 7.1 04/05/2017 12:32 PM    HGB 13.5 04/05/2017 12:32 PM    HCT 40.7 04/05/2017 12:32 PM    PLATELET 312 04/05/2017 12:32 PM     Lab Results   Component Value Date/Time    Sodium 142 04/05/2017 12:32 PM    Potassium 4.5 04/05/2017 12:32 PM  Chloride 107 (H) 04/05/2017 12:32 PM    CO2 20 04/05/2017 12:32 PM    Glucose 152 (H) 04/05/2017 12:32 PM    BUN 14 04/05/2017 12:32 PM    Creatinine 0.73 04/05/2017 12:32 PM      Lab Results   Component Value Date/Time    Cholesterol, total 160 04/05/2017 12:32 PM    HDL Cholesterol 61 04/05/2017 12:32 PM    LDL, calculated 77 04/05/2017 12:32 PM    Triglyceride 112 04/05/2017 12:32 PM     No results found for: GPT        Impression / Plan:        ICD-10-CM ICD-9-CM    1. Annual physical exam Z00.00 V70.0    2. Need for vaccination Z23 V05.9 PNEUMOCOCCAL POLYSACCHARIDE VACCINE, 23-VALENT, ADULT OR IMMUNOSUPPRESSED PT DOSE,      DISCONTINUED: pneumococcal 23-valent (PNEUMOVAX 23) 25 mcg/0.5 mL injection   3. Right shoulder pain, unspecified chronicity M25.511 719.41 diclofenac (VOLTAREN) 1 % gel   4. DM type 2, goal HbA1c < 7% (HCC) E11.9 250.00    5. Essential hypertension I10 401.9 lisinopril (PRINIVIL, ZESTRIL) 10 mg tablet   6. Coronary artery disease involving native coronary artery of native heart without angina pectoris I25.10 414.01    7. Hypercholesteremia E78.00 272.0      Based on history ,exam, nuclear stress test report recently done , I do not find contra indication to planned teeth extraction.      Explained to patient risk benefits of the medications.Advised patient to stop meds if having any side effects.Pt verbalized understanding of the instructions.    I have discussed the diagnosis with the patient and the intended plan as seen in the above orders.  The patient has received an after-visit summary and questions were answered concerning future plans.  I have discussed medication side effects and warnings with the patient as well. I have reviewed the plan of care with the patient, accepted their input and they are in agreement with the treatment goals.     Reviewed plan of care. Patient has provided input and agrees with goals.    Follow-up and Dispositions    ?? Return in about 3 months (around 07/26/2017).         Jenene Slicker, MD

## 2017-04-28 NOTE — Telephone Encounter (Signed)
History and physical scanned in pt's chart. Please fax as requested.

## 2017-04-28 NOTE — Telephone Encounter (Signed)
PT. Would like for a nurse to fax her Physical  to: [phone]  Dr. Molli BarrowsHerrerra

## 2017-05-03 ENCOUNTER — Encounter: Attending: Internal Medicine | Primary: Internal Medicine

## 2017-05-20 MED ORDER — ASPIRIN 81 MG TAB, DELAYED RELEASE
81 mg | ORAL_TABLET | Freq: Every day | ORAL | 3 refills | Status: DC
Start: 2017-05-20 — End: 2018-03-08

## 2017-05-20 MED ORDER — METOPROLOL TARTRATE 50 MG TAB
50 mg | ORAL_TABLET | Freq: Two times a day (BID) | ORAL | 3 refills | Status: DC
Start: 2017-05-20 — End: 2018-01-06

## 2017-05-20 MED ORDER — INSULIN ASPART 100 UNIT/ML (3 ML) SUB-Q PEN
100 unit/mL (3 mL) | PEN_INJECTOR | SUBCUTANEOUS | 3 refills | Status: DC
Start: 2017-05-20 — End: 2017-07-29

## 2017-05-21 NOTE — Telephone Encounter (Signed)
-----   Message from Metta Clines sent at 05/21/2017 12:17 PM EDT -----  Regarding: Dr. Deshmukh/ Telephone   Darl Pikes with [employer] is call to check on the status of a a request sent over on 05/06/17. She wants to verify that it was received and when to expected the request returned. Best contact is [phone].

## 2017-05-21 NOTE — Telephone Encounter (Signed)
See previous note.

## 2017-05-21 NOTE — Telephone Encounter (Signed)
I dont do disability evaluations.  I recevied from from [employer].  If it s related to her heart condition, She needs to show it to Cardiology.

## 2017-05-21 NOTE — Telephone Encounter (Signed)
Per Dr.Deshmukh,    He does not do disability evaluations. He recevied from from [employer].  If its related to her heart condition, She needs to show it to Cardiology.

## 2017-06-01 NOTE — Telephone Encounter (Signed)
I don't do disability evaluations. We can inform the pt and the agency.

## 2017-06-01 NOTE — Telephone Encounter (Signed)
-----   Message from Maryann Conners sent at 06/01/2017  9:26 AM EDT -----  Regarding: Dr. Deshmukh/Telephone  Darl Pikes with [facility]) is requesting a call back regarding if the practice received pt's disability form to be filled out and sent back. Best contact is [phone].

## 2017-06-01 NOTE — Telephone Encounter (Signed)
Samara Deist, please see previous encounter from 05/21/17, and call them to let them know we do not do disability evaluations. Thank you!

## 2017-06-02 NOTE — Telephone Encounter (Signed)
Patient states that she had MRI done with ortho and never heard back regarding the results and wanted to know if we received them. Advised that we did not and she needs to call their office to follow up. Pt verbalized her understanding. States that she called this morning and had to leave a message, waiting for them to return her call.

## 2017-06-02 NOTE — Telephone Encounter (Signed)
Patient called requesting to speak to a nurse. I asked her if she would like to send a message to them and she said "No. It is just very important that I speak to a nurse as soon as possible."

## 2017-06-02 NOTE — Telephone Encounter (Signed)
LVM returning pt's phone.

## 2017-06-24 ENCOUNTER — Encounter: Attending: Internal Medicine | Primary: Internal Medicine

## 2017-07-26 ENCOUNTER — Encounter: Attending: Internal Medicine | Primary: Internal Medicine

## 2017-07-29 ENCOUNTER — Ambulatory Visit: Attending: Internal Medicine | Primary: Internal Medicine

## 2017-07-29 ENCOUNTER — Ambulatory Visit
Admit: 2017-07-29 | Discharge: 2017-07-29 | Payer: PRIVATE HEALTH INSURANCE | Attending: Internal Medicine | Primary: Internal Medicine

## 2017-07-29 DIAGNOSIS — E119 Type 2 diabetes mellitus without complications: Secondary | ICD-10-CM

## 2017-07-29 LAB — AMB POC HEMOGLOBIN A1C
Hemoglobin A1C, POC: 7.8 %
Hemoglobin A1c (POC): 7.8 %

## 2017-07-29 MED ORDER — INSULIN ASPART 100 UNIT/ML (3 ML) SUB-Q PEN
100 unit/mL (3 mL) | PEN_INJECTOR | SUBCUTANEOUS | 3 refills | Status: DC
Start: 2017-07-29 — End: 2018-01-24

## 2017-07-29 MED ORDER — INSULIN GLARGINE 100 UNIT/ML (3 ML) SUB-Q PEN
100 unit/mL (3 mL) | PEN_INJECTOR | Freq: Every evening | SUBCUTANEOUS | 3 refills | Status: DC
Start: 2017-07-29 — End: 2018-01-24

## 2017-07-29 NOTE — Progress Notes (Signed)
 Chief Complaint   Patient presents with   . Pre-op Exam     right shoulder, Aug 5 with [facility]     1. Have you been to the ER, urgent care clinic since your last visit?  Hospitalized since your last visit?Yes [facility], for chest pains and back pain 1 1/2 weeks ago.     2. Have you seen or consulted any other health care providers outside of the [facility] since your last visit?  Include any pap smears or colon screening. No

## 2017-07-29 NOTE — Progress Notes (Signed)
Cathy [name] is a 52 y.o.  female and presents with     Chief Complaint   Patient presents with   ??? Pre-op Exam     right shoulder, Aug 5 with [facility]   ??? Coronary Artery Disease   ??? Shoulder Pain   ??? Hypertension     Pt is supposed to get rt shoulder surgery on August 5th.  Pt sees cardiology for CAD and is supposed to get clearance fro mcardiology.  Pt is administering Lantus 40 units daily and NOvolog 10-12 units three times daily.  Pt went to [facility] for chest pain , back pain and was give  meds for back pain.  Pt was told her heart is fine.  Pt denies hypoglycemia.  Pt says lately her blood sugars have been in the 200 range.                Past Medical History:   Diagnosis Date   ??? Asthma    ??? Depression    ??? Diabetes (HCC)     Type 2 DM   ??? Hypercholesterolemia    ??? Hypertension      Past Surgical History:   Procedure Laterality Date   ??? HX ACL RECONSTRUCTION      right knee   ??? HX APPENDECTOMY     ??? HX ARTERIAL BYPASS      left main artery bypass   ??? HX HYSTERECTOMY       Current Outpatient Medications   Medication Sig   ??? carisoprodol (SOMA) 350 mg tablet    ??? lidocaine (LIDODERM) 5 %    ??? metoprolol tartrate (LOPRESSOR) 50 mg tablet Take 1 Tab by mouth two (2) times a day.   ??? insulin aspart U-100 (NOVOLOG FLEXPEN U-100 INSULIN) 100 unit/mL (3 mL) inpn adminitser 10 units subcut three times daily before each meal   ??? aspirin delayed-release 81 mg tablet Take 1 Tab by mouth daily.   ??? diclofenac (VOLTAREN) 1 % gel Apply 4 g to affected area four (4) times daily.   ??? lisinopril (PRINIVIL, ZESTRIL) 10 mg tablet Take 1 Tab by mouth daily.   ??? dextroamphetamine-amphetamine (ADDERALL) 20 mg tablet Take 20 mg by mouth two (2) times a day.   ??? albuterol (PROVENTIL HFA) 90 mcg/actuation inhaler Take 1 Puff by inhalation.   ??? albuterol (PROVENTIL VENTOLIN) 2.5 mg /3 mL (0.083 %) nebulizer solution Take 2.5 mg by inhalation.   ??? FLUoxetine (PROZAC) 20 mg capsule Take 20 mg by mouth daily.   ???  hydrOXYzine HCl (ATARAX) 25 mg tablet Take 25 mg by mouth as needed.   ??? insulin glargine (LANTUS SOLOSTAR U-100 INSULIN) 100 unit/mL (3 mL) inpn 40 Units by SubCUTAneous route nightly.   ??? rosuvastatin (CRESTOR) 10 mg tablet Take 10 mg by mouth.   ??? traZODone (DESYREL) 100 mg tablet Take 100 mg by mouth nightly as needed.   ??? Blood-Glucose Meter (TRUE METRIX GLUCOSE METER) misc Use as directed   ??? glucose blood VI test strips (TRUE METRIX GLUCOSE TEST STRIP) strip Use as instructed   ??? lancets (TRUEPLUS LANCETS) 28 gauge misc Use as directed     No current facility-administered medications for this visit.      Health Maintenance   Topic Date Due   ??? Hepatitis C Screening  07/01/1963   ??? FOOT EXAM Q1  09/13/1973   ??? EYE EXAM RETINAL OR DILATED  09/13/1973   ??? DTaP/Tdap/Td series (1 - Tdap) 09/13/1984   ???  Shingrix Vaccine Age 32> (1 of 2) 09/13/2013   ??? FOBT Q 1 YEAR AGE 79-75  09/13/2013   ??? Influenza Age 14 to Adult  09/10/2017 (Originally 08/12/2017)   ??? HEMOGLOBIN A1C Q6M  10/06/2017   ??? MICROALBUMIN Q1  04/06/2018   ??? LIPID PANEL Q1  04/06/2018   ??? PAP AKA CERVICAL CYTOLOGY  03/06/2019   ??? BREAST CANCER SCRN MAMMOGRAM  04/23/2019   ??? Pneumococcal 0-64 years  Completed     Immunization History   Administered Date(s) Administered   ??? Pneumococcal Polysaccharide (PPSV-23) 04/26/2017     No LMP recorded. Patient has had a hysterectomy.        Allergies and Intolerances:   Allergies   Allergen Reactions   ??? Pyridium [Phenazopyridine] Itching       Family History:   No family history on file.    Social History:   She  reports that she has been smoking. She has a 10.00 pack-year smoking history. She has quit using smokeless tobacco.  She  reports that she drank alcohol.            Review of Systems:   General: negative for - chills, fatigue, fever, weight change  Psych: negative for - anxiety, depression, irritability or mood swings  ENT: negative for - headaches, hearing change, nasal congestion, oral lesions, sneezing  or sore throat  Heme/ Lymph: negative for - bleeding problems, bruising, pallor or swollen lymph nodes  Endo: negative for - hot flashes, polydipsia/polyuria or temperature intolerance  Resp: negative for - cough, shortness of breath or wheezing  CV: negative for - chest pain, edema or palpitations  GI: negative for - abdominal pain, change in bowel habits, constipation, diarrhea or nausea/vomiting  GU: negative for - dysuria, hematuria, incontinence, pelvic pain or vulvar/vaginal symptoms  MSK: negative for - joint pain, joint swelling or muscle pain  Neuro: negative for - confusion, headaches, seizures or weakness  Derm: negative for - dry skin, hair changes, rash or skin lesion changes          Physical:   Vitals:   Vitals:    07/29/17 0828   BP: 146/80   Pulse: (!) 51   Resp: 16   Temp: 97.9 ??F (36.6 ??C)   TempSrc: Oral   SpO2: 98%   Weight: 187 lb (84.8 kg)   Height: 5\' 6"  (1.676 m)           Exam:   HEENT- atraumatic,normocephalic, awake, oriented, well nourished  Neck - supple,no enlarged lymph nodes, no JVD, no thyromegaly  Chest- CTA, no rhonchi, no crackles  Heart- rrr, no murmurs / gallop/rub  Abdomen- soft,BS+,NT, no hepatosplenomegaly  Ext - no c/c/edema   Neuro- no focal deficits.Power 5/5 all extremities  Skin - warm,dry, no obvious rashes.          Review of Data:   LABS:   Lab Results   Component Value Date/Time    WBC 7.1 04/05/2017 12:32 PM    HGB 13.5 04/05/2017 12:32 PM    HCT 40.7 04/05/2017 12:32 PM    PLATELET 312 04/05/2017 12:32 PM     Lab Results   Component Value Date/Time    Sodium 142 04/05/2017 12:32 PM    Potassium 4.5 04/05/2017 12:32 PM    Chloride 107 (H) 04/05/2017 12:32 PM    CO2 20 04/05/2017 12:32 PM    Glucose 152 (H) 04/05/2017 12:32 PM    BUN 14 04/05/2017 12:32 PM  Creatinine 0.73 04/05/2017 12:32 PM     Lab Results   Component Value Date/Time    Cholesterol, total 160 04/05/2017 12:32 PM    HDL Cholesterol 61 04/05/2017 12:32 PM    LDL, calculated 77 04/05/2017 12:32 PM     Triglyceride 112 04/05/2017 12:32 PM     No results found for: GPT        Impression / Plan:        ICD-10-CM ICD-9-CM    1. DM type 2, goal HbA1c < 7% (HCC) E11.9 250.00 AMB POC HEMOGLOBIN A1C         HTN- stable    CAD - follow up with cardiology , s/p CABG 3 years back    A1c - 7.8 improved ( was 9.6 ) ,  increase Lantus 50 units daily.    Rt shoulder pain - surgery shceduled for August 5th.    A1c to be faxed per pt request.    Cardiology to clear her for surgery.      Explained to patient risk benefits of the medications.Advised patient to stop meds if having any side effects.Pt verbalized understanding of the instructions.    I have discussed the diagnosis with the patient and the intended plan as seen in the above orders.  The patient has received an after-visit summary and questions were answered concerning future plans.  I have discussed medication side effects and warnings with the patient as well. I have reviewed the plan of care with the patient, accepted their input and they are in agreement with the treatment goals.     Reviewed plan of care. Patient has provided input and agrees with goals.        Jenene SlickerPravin M Llewellyn Schoenberger, MD

## 2017-07-29 NOTE — Progress Notes (Signed)
Chief Complaint   Patient presents with   ??? Pre-op Exam     right shoulder, Aug 5 with [facility]     1. Have you been to the ER, urgent care clinic since your last visit?  Hospitalized since your last visit?Yes [facility], for chest pains and back pain 1 1/2 weeks ago.     2. Have you seen or consulted any other health care providers outside of the  Health System since your last visit?  Include any pap smears or colon screening. No

## 2017-07-29 NOTE — Telephone Encounter (Signed)
Pt called and stated she needs a refill on her insulin. Forgot to ask at her appointment this morning, lantus and novalog

## 2017-07-29 NOTE — Progress Notes (Signed)
Cathy [name] is a 52 y.o.  female and presents with     Chief Complaint   Patient presents with   ??? Pre-op Exam     right shoulder, Aug 5 with [facility]   ??? Coronary Artery Disease   ??? Shoulder Pain   ??? Hypertension     Pt is supposed to get rt shoulder surgery on August 5th.  Pt sees cardiology for CAD and is supposed to get clearance fro mcardiology.  Pt is administering Lantus 40 units daily and NOvolog 10-12 units three times daily.  Pt went to [facility] for chest pain , back pain and was give  meds for back pain.  Pt was told her heart is fine.  Pt denies hypoglycemia.  Pt says lately her blood sugars have been in the 200 range.                Past Medical History:   Diagnosis Date   ??? Asthma    ??? Depression    ??? Diabetes (HCC)     Type 2 DM   ??? Hypercholesterolemia    ??? Hypertension      Past Surgical History:   Procedure Laterality Date   ??? HX ACL RECONSTRUCTION      right knee   ??? HX APPENDECTOMY     ??? HX ARTERIAL BYPASS      left main artery bypass   ??? HX HYSTERECTOMY       Current Outpatient Medications   Medication Sig   ??? carisoprodol (SOMA) 350 mg tablet    ??? lidocaine (LIDODERM) 5 %    ??? metoprolol tartrate (LOPRESSOR) 50 mg tablet Take 1 Tab by mouth two (2) times a day.   ??? insulin aspart U-100 (NOVOLOG FLEXPEN U-100 INSULIN) 100 unit/mL (3 mL) inpn adminitser 10 units subcut three times daily before each meal   ??? aspirin delayed-release 81 mg tablet Take 1 Tab by mouth daily.   ??? diclofenac (VOLTAREN) 1 % gel Apply 4 g to affected area four (4) times daily.   ??? lisinopril (PRINIVIL, ZESTRIL) 10 mg tablet Take 1 Tab by mouth daily.   ??? dextroamphetamine-amphetamine (ADDERALL) 20 mg tablet Take 20 mg by mouth two (2) times a day.   ??? albuterol (PROVENTIL HFA) 90 mcg/actuation inhaler Take 1 Puff by inhalation.   ??? albuterol (PROVENTIL VENTOLIN) 2.5 mg /3 mL (0.083 %) nebulizer solution Take 2.5 mg by inhalation.   ??? FLUoxetine (PROZAC) 20 mg capsule Take 20 mg by mouth daily.    ??? hydrOXYzine HCl (ATARAX) 25 mg tablet Take 25 mg by mouth as needed.   ??? insulin glargine (LANTUS SOLOSTAR U-100 INSULIN) 100 unit/mL (3 mL) inpn 40 Units by SubCUTAneous route nightly.   ??? rosuvastatin (CRESTOR) 10 mg tablet Take 10 mg by mouth.   ??? traZODone (DESYREL) 100 mg tablet Take 100 mg by mouth nightly as needed.   ??? Blood-Glucose Meter (TRUE METRIX GLUCOSE METER) misc Use as directed   ??? glucose blood VI test strips (TRUE METRIX GLUCOSE TEST STRIP) strip Use as instructed   ??? lancets (TRUEPLUS LANCETS) 28 gauge misc Use as directed     No current facility-administered medications for this visit.      Health Maintenance   Topic Date Due   ??? Hepatitis C Screening  [birth year]   ??? FOOT EXAM Q1  09/13/1973   ??? EYE EXAM RETINAL OR DILATED  09/13/1973   ??? DTaP/Tdap/Td series (1 - Tdap) 09/13/1984   ???  Shingrix Vaccine Age 9> (1 of 2) 09/13/2013   ??? FOBT Q 1 YEAR AGE 90-75  09/13/2013   ??? Influenza Age 15 to Adult  09/10/2017 (Originally 08/12/2017)   ??? HEMOGLOBIN A1C Q6M  10/06/2017   ??? MICROALBUMIN Q1  04/06/2018   ??? LIPID PANEL Q1  04/06/2018   ??? PAP AKA CERVICAL CYTOLOGY  03/06/2019   ??? BREAST CANCER SCRN MAMMOGRAM  04/23/2019   ??? Pneumococcal 0-64 years  Completed     Immunization History   Administered Date(s) Administered   ??? Pneumococcal Polysaccharide (PPSV-23) 04/26/2017     No LMP recorded. Patient has had a hysterectomy.        Allergies and Intolerances:   Allergies   Allergen Reactions   ??? Pyridium [Phenazopyridine] Itching       Family History:   No family history on file.    Social History:   She  reports that she has been smoking. She has a 10.00 pack-year smoking history. She has quit using smokeless tobacco.  She  reports that she drank alcohol.            Review of Systems:   General: negative for - chills, fatigue, fever, weight change  Psych: negative for - anxiety, depression, irritability or mood swings  ENT: negative for - headaches, hearing change, nasal congestion, oral  lesions, sneezing or sore throat  Heme/ Lymph: negative for - bleeding problems, bruising, pallor or swollen lymph nodes  Endo: negative for - hot flashes, polydipsia/polyuria or temperature intolerance  Resp: negative for - cough, shortness of breath or wheezing  CV: negative for - chest pain, edema or palpitations  GI: negative for - abdominal pain, change in bowel habits, constipation, diarrhea or nausea/vomiting  GU: negative for - dysuria, hematuria, incontinence, pelvic pain or vulvar/vaginal symptoms  MSK: negative for - joint pain, joint swelling or muscle pain  Neuro: negative for - confusion, headaches, seizures or weakness  Derm: negative for - dry skin, hair changes, rash or skin lesion changes          Physical:   Vitals:   Vitals:    07/29/17 0828   BP: 146/80   Pulse: (!) 51   Resp: 16   Temp: 97.9 ??F (36.6 ??C)   TempSrc: Oral   SpO2: 98%   Weight: 187 lb (84.8 kg)   Height: 5\' 6"  (1.676 m)           Exam:   HEENT- atraumatic,normocephalic, awake, oriented, well nourished  Neck - supple,no enlarged lymph nodes, no JVD, no thyromegaly  Chest- CTA, no rhonchi, no crackles  Heart- rrr, no murmurs / gallop/rub  Abdomen- soft,BS+,NT, no hepatosplenomegaly  Ext - no c/c/edema   Neuro- no focal deficits.Power 5/5 all extremities  Skin - warm,dry, no obvious rashes.          Review of Data:   LABS:   Lab Results   Component Value Date/Time    WBC 7.1 04/05/2017 12:32 PM    HGB 13.5 04/05/2017 12:32 PM    HCT 40.7 04/05/2017 12:32 PM    PLATELET 312 04/05/2017 12:32 PM     Lab Results   Component Value Date/Time    Sodium 142 04/05/2017 12:32 PM    Potassium 4.5 04/05/2017 12:32 PM    Chloride 107 (H) 04/05/2017 12:32 PM    CO2 20 04/05/2017 12:32 PM    Glucose 152 (H) 04/05/2017 12:32 PM    BUN 14 04/05/2017 12:32 PM  Creatinine 0.73 04/05/2017 12:32 PM     Lab Results   Component Value Date/Time    Cholesterol, total 160 04/05/2017 12:32 PM    HDL Cholesterol 61 04/05/2017 12:32 PM     LDL, calculated 77 04/05/2017 12:32 PM    Triglyceride 112 04/05/2017 12:32 PM     No results found for: GPT        Impression / Plan:        ICD-10-CM ICD-9-CM    1. DM type 2, goal HbA1c < 7% (HCC) E11.9 250.00 AMB POC HEMOGLOBIN A1C         HTN- stable    CAD - follow up with cardiology , s/p CABG 3 years back    A1c - 7.8 improved ( was 9.6 ) ,  increase Lantus 50 units daily.    Rt shoulder pain - surgery shceduled for August 5th.    A1c to be faxed per pt request.    Cardiology to clear her for surgery.      Explained to patient risk benefits of the medications.Advised patient to stop meds if having any side effects.Pt verbalized understanding of the instructions.    I have discussed the diagnosis with the patient and the intended plan as seen in the above orders.  The patient has received an after-visit summary and questions were answered concerning future plans.  I have discussed medication side effects and warnings with the patient as well. I have reviewed the plan of care with the patient, accepted their input and they are in agreement with the treatment goals.     Reviewed plan of care. Patient has provided input and agrees with goals.        Jenene SlickerPravin M Santosh Petter, MD

## 2017-08-06 NOTE — Telephone Encounter (Signed)
-----   Message from Ellamae SiaAndre Burnett sent at 08/06/2017  2:48 PM EDT -----  Regarding: Dr. Doreene Nesteshmukh/Telephone  Miles with [employer] is requesting a call back in regards to a medical records request that was sent on 7/24. He would like a status update if it was received. Best contact number is [phone] request number [id number].

## 2017-08-06 NOTE — Telephone Encounter (Signed)
I have spoken to Cathy [name] with [employer], I let her know we have not received anything from this month. She states they have mailed us the medical records request on 08/04/17 so they may still be "on the way." They are requesting our office notes from 04/27/17-08/12/17. I let her know we have seen the patient on 04/26/17 and asked if they wanted those records, she stated someone would call me back with the answer to that question. I then let her know we wouldn't be sending anything via fax until we receive the papers in the mail. She verbalized her understanding and thanked me for the call.

## 2017-08-24 NOTE — Telephone Encounter (Signed)
I was unaware and not notified that the papers were scanned in or received. Documents faxed to [phone] [employer] as requested. Fax confirmation received.

## 2017-08-24 NOTE — Telephone Encounter (Signed)
The disability company called checking on the status, documents scanned in on 08/19/17

## 2017-08-26 NOTE — Progress Notes (Signed)
Previously notes were sent to [employer] as requested.  I received a form to assess pts disability  I do not do disability assessment.The form they have sent for evaluation is beyond my scope of practice.

## 2017-08-26 NOTE — Progress Notes (Signed)
Previously notes were sent to [employer] as requested.  I received a form to assess pts disability  I do not do disability assessment.The form they have sent for evaluation is beyond my scope of practice.

## 2017-08-27 NOTE — Telephone Encounter (Signed)
Resent as requested.

## 2017-08-27 NOTE — Telephone Encounter (Signed)
-----   Message from Metta ClinesLeandra D Simms sent at 08/27/2017 10:41 AM EDT -----  Regarding: Dr. Deshmukh/ Telephone   Tonna CornerLily with [employer] is calling regarding an incomplete fax over of medical records. The fax cover page stated 12 pg's but 1-3 is missing. Please resend to fax number [phone].

## 2017-09-23 NOTE — Telephone Encounter (Signed)
Script written for [employer]outhpiece. Migranal is not safe . I dont think anyone prescribes it anymore. It is a very old drug .

## 2017-09-23 NOTE — Telephone Encounter (Signed)
Pres[facility] for mouth piece [facility] [address] per patient request.

## 2017-09-23 NOTE — Telephone Encounter (Signed)
patient lost mouth piece for nebulizer and pharmacy says need a script for a new one, patient says former PCP prescribed Migranal and her migraines have returned would like to have a script sent to pharmacy for this med if possible [phone]

## 2017-10-14 ENCOUNTER — Ambulatory Visit: Attending: Internal Medicine | Primary: Internal Medicine

## 2017-10-14 ENCOUNTER — Inpatient Hospital Stay: Admit: 2017-10-14 | Payer: PRIVATE HEALTH INSURANCE | Primary: Internal Medicine

## 2017-10-14 ENCOUNTER — Ambulatory Visit: Admit: 2017-10-14 | Payer: PRIVATE HEALTH INSURANCE | Attending: Internal Medicine | Primary: Internal Medicine

## 2017-10-14 DIAGNOSIS — E119 Type 2 diabetes mellitus without complications: Secondary | ICD-10-CM

## 2017-10-14 DIAGNOSIS — R599 Enlarged lymph nodes, unspecified: Secondary | ICD-10-CM

## 2017-10-14 LAB — AMB POC HEMOGLOBIN A1C
Hemoglobin A1C, POC: 7.8 %
Hemoglobin A1c (POC): 7.8 %

## 2017-10-14 MED ORDER — LISINOPRIL 20 MG TAB
20 mg | ORAL_TABLET | Freq: Every day | ORAL | 3 refills | Status: DC
Start: 2017-10-14 — End: 2018-08-30

## 2017-10-14 MED ORDER — PERMETHRIN 5 % TOPICAL CREAM
5 % | CUTANEOUS | 3 refills | Status: DC
Start: 2017-10-14 — End: 2018-01-24

## 2017-10-14 NOTE — Progress Notes (Signed)
Cathy [name] is a 52 y.o.  female and presents with     Chief Complaint   Patient presents with   ??? Skin Problem     bump in nieck, itching bilateral arms, onset with itching for week, onset with bump yesterday   ??? Diabetes   ??? Hypertension   ??? Coronary Artery Disease     Pt has itching on her forearms. She has tried moisturizing cream. She is also getting atarax from her Psychiatrist.She had scabies in the past.  She also noticed bump in her neck few days ag.o. She has remote h/o endometrial cancer.  Pt says her sugars drop after she takes novolog. She is following sliding scale . However she does not eat after taking Novolog.  Her BP is up.  She sees cardiology.She is not monitoring her BP.          Past Medical History:   Diagnosis Date   ??? Asthma    ??? Depression    ??? Diabetes (HCC)     Type 2 DM   ??? Hypercholesterolemia    ??? Hypertension      Past Surgical History:   Procedure Laterality Date   ??? HX ACL RECONSTRUCTION      right knee   ??? HX APPENDECTOMY     ??? HX ARTERIAL BYPASS      left main artery bypass   ??? HX HYSTERECTOMY     ??? HX WISDOM TEETH EXTRACTION       Current Outpatient Medications   Medication Sig   ??? permethrin (ACTICIN) 5 % topical cream apply sparingly as directed   ??? lisinopril (PRINIVIL, ZESTRIL) 20 mg tablet Take 1 Tab by mouth daily.   ??? carisoprodol (SOMA) 350 mg tablet    ??? lidocaine (LIDODERM) 5 %    ??? insulin glargine (LANTUS SOLOSTAR U-100 INSULIN) 100 unit/mL (3 mL) inpn 50 Units by SubCUTAneous route nightly.   ??? insulin aspart U-100 (NOVOLOG FLEXPEN U-100 INSULIN) 100 unit/mL (3 mL) inpn adminitser 10 units subcut three times daily before each meal   ??? metoprolol tartrate (LOPRESSOR) 50 mg tablet Take 1 Tab by mouth two (2) times a day.   ??? aspirin delayed-release 81 mg tablet Take 1 Tab by mouth daily.   ??? diclofenac (VOLTAREN) 1 % gel Apply 4 g to affected area four (4) times daily.   ??? dextroamphetamine-amphetamine (ADDERALL) 20 mg tablet Take 20 mg by mouth two (2) times a day.    ??? Blood-Glucose Meter (TRUE METRIX GLUCOSE METER) misc Use as directed   ??? albuterol (PROVENTIL HFA) 90 mcg/actuation inhaler Take 1 Puff by inhalation.   ??? albuterol (PROVENTIL VENTOLIN) 2.5 mg /3 mL (0.083 %) nebulizer solution Take 2.5 mg by inhalation.   ??? glucose blood VI test strips (TRUE METRIX GLUCOSE TEST STRIP) strip Use as instructed   ??? FLUoxetine (PROZAC) 20 mg capsule Take 20 mg by mouth daily.   ??? hydrOXYzine HCl (ATARAX) 25 mg tablet Take 25 mg by mouth as needed.   ??? lancets (TRUEPLUS LANCETS) 28 gauge misc Use as directed   ??? rosuvastatin (CRESTOR) 10 mg tablet Take 10 mg by mouth.   ??? traZODone (DESYREL) 100 mg tablet Take 100 mg by mouth nightly as needed.     No current facility-administered medications for this visit.      Health Maintenance   Topic Date Due   ??? Hepatitis C Screening  12/[birth year]   ??? FOOT EXAM Q1  09/13/1973   ???  EYE EXAM RETINAL OR DILATED  09/13/1973   ??? DTaP/Tdap/Td series (1 - Tdap) 09/13/1984   ??? Shingrix Vaccine Age 33> (1 of 2) 09/13/2013   ??? FOBT Q 1 YEAR AGE 70-75  09/13/2013   ??? Influenza Age 46 to Adult  08/12/2017   ??? HEMOGLOBIN A1C Q6M  01/29/2018   ??? MICROALBUMIN Q1  04/06/2018   ??? LIPID PANEL Q1  04/06/2018   ??? PAP AKA CERVICAL CYTOLOGY  03/06/2019   ??? BREAST CANCER SCRN MAMMOGRAM  04/23/2019   ??? Pneumococcal 0-64 years  Completed     Immunization History   Administered Date(s) Administered   ??? Pneumococcal Polysaccharide (PPSV-23) 04/26/2017     No LMP recorded. Patient has had a hysterectomy.        Allergies and Intolerances:   Allergies   Allergen Reactions   ??? Pyridium [Phenazopyridine] Itching       Family History:   No family history on file.    Social History:   She  reports that she has been smoking. She has a 10.00 pack-year smoking history. She has quit using smokeless tobacco.  She  reports that she drank alcohol.            Review of Systems:   General: negative for - chills, fatigue, fever, weight change  Psych: negative for - anxiety, depression,  irritability or mood swings  ENT: negative for - headaches, hearing change, nasal congestion, oral lesions, sneezing or sore throat  Heme/ Lymph: negative for - bleeding problems, bruising, pallor or swollen lymph nodes  Endo: negative for - hot flashes, polydipsia/polyuria or temperature intolerance  Resp: negative for - cough, shortness of breath or wheezing  CV: negative for - chest pain, edema or palpitations  GI: negative for - abdominal pain, change in bowel habits, constipation, diarrhea or nausea/vomiting  GU: negative for - dysuria, hematuria, incontinence, pelvic pain or vulvar/vaginal symptoms  MSK: negative for - joint pain, joint swelling or muscle pain  Neuro: negative for - confusion, headaches, seizures or weakness  Derm: negative for - dry skin, hair changes, rash or skin lesion changes          Physical:   Vitals:   Vitals:    10/14/17 0828   BP: 162/82   Pulse: 60   Resp: 16   Temp: 98.8 ??F (37.1 ??C)   TempSrc: Oral   SpO2: 100%   Weight: 186 lb 6.4 oz (84.6 kg)   Height: 5\' 8"  (1.727 m)           Exam:   HEENT- atraumatic,normocephalic, awake, oriented, well nourished  Neck - supple,no enlarged lymph nodes, no JVD, no thyromegaly, enlarged round , form lymphnode in the neck rt side  Chest- CTA, no rhonchi, no crackles  Heart- rrr, no murmurs / gallop/rub  Abdomen- soft,BS+,NT, no hepatosplenomegaly  Ext - no c/c/edema   Neuro- no focal deficits.Power 5/5 all extremities  Skin - warm,dry, no obvious rashes., small papular lesions on arms          Review of Data:   LABS:   Lab Results   Component Value Date/Time    WBC 7.1 04/05/2017 12:32 PM    HGB 13.5 04/05/2017 12:32 PM    HCT 40.7 04/05/2017 12:32 PM    PLATELET 312 04/05/2017 12:32 PM     Lab Results   Component Value Date/Time    Sodium 142 04/05/2017 12:32 PM    Potassium 4.5 04/05/2017 12:32 PM    Chloride  107 (H) 04/05/2017 12:32 PM    CO2 20 04/05/2017 12:32 PM    Glucose 152 (H) 04/05/2017 12:32 PM    BUN 14 04/05/2017 12:32 PM     Creatinine 0.73 04/05/2017 12:32 PM     Lab Results   Component Value Date/Time    Cholesterol, total 160 04/05/2017 12:32 PM    HDL Cholesterol 61 04/05/2017 12:32 PM    LDL, calculated 77 04/05/2017 12:32 PM    Triglyceride 112 04/05/2017 12:32 PM     No results found for: GPT        Impression / Plan:        ICD-10-CM ICD-9-CM    1. DM type 2, goal HbA1c < 7% (HCC) E11.9 250.00 AMB POC HEMOGLOBIN A1C   2. Itching L29.9 698.9 permethrin (ACTICIN) 5 % topical cream      METABOLIC PANEL, COMPREHENSIVE   3. Essential hypertension I10 401.9 lisinopril (PRINIVIL, ZESTRIL) 20 mg tablet   4. Smoker F17.200 305.1 XR CHEST PA LAT   5. Lymph node enlargement R59.9 785.6 XR CHEST PA LAT      CT NECK SOFT TISSUE W WO CONT     Advised pt to eat 15-30 minutes after taking Novolog to avoid hypoglycemia.    HTN - low salt diet.    Increase lisinopril to 20 mg po daily.    Explained to patient risk benefits of the medications.Advised patient to stop meds if having any side effects.Pt verbalized understanding of the instructions.    I have discussed the diagnosis with the patient and the intended plan as seen in the above orders.  The patient has received an after-visit summary and questions were answered concerning future plans.  I have discussed medication side effects and warnings with the patient as well. I have reviewed the plan of care with the patient, accepted their input and they are in agreement with the treatment goals.     Reviewed plan of care. Patient has provided input and agrees with goals.        Jenene Slicker, MD

## 2017-10-14 NOTE — Progress Notes (Signed)
 Chief Complaint   Patient presents with   . Skin Problem     bump in nieck, itching bilateral arms, onset with itching for week, onset with bump yesterday   . Diabetes   . Hypertension   . Coronary Artery Disease     1. Have you been to the ER, urgent care clinic since your last visit?  Hospitalized since your last visit?No    2. Have you seen or consulted any other health care providers outside of the [facility] since your last visit?  Include any pap smears or colon screening. No

## 2017-10-14 NOTE — Progress Notes (Signed)
Cathy [name] is a 52 y.o.  female and presents with     Chief Complaint   Patient presents with   ??? Skin Problem     bump in nieck, itching bilateral arms, onset with itching for week, onset with bump yesterday   ??? Diabetes   ??? Hypertension   ??? Coronary Artery Disease     Pt has itching on her forearms. She has tried moisturizing cream. She is also getting atarax from her Psychiatrist.She had scabies in the past.  She also noticed bump in her neck few days ag.o. She has remote h/o endometrial cancer.  Pt says her sugars drop after she takes novolog. She is following sliding scale . However she does not eat after taking Novolog.  Her BP is up.  She sees cardiology.She is not monitoring her BP.          Past Medical History:   Diagnosis Date   ??? Asthma    ??? Depression    ??? Diabetes (HCC)     Type 2 DM   ??? Hypercholesterolemia    ??? Hypertension      Past Surgical History:   Procedure Laterality Date   ??? HX ACL RECONSTRUCTION      right knee   ??? HX APPENDECTOMY     ??? HX ARTERIAL BYPASS      left main artery bypass   ??? HX HYSTERECTOMY     ??? HX WISDOM TEETH EXTRACTION       Current Outpatient Medications   Medication Sig   ??? permethrin (ACTICIN) 5 % topical cream apply sparingly as directed   ??? lisinopril (PRINIVIL, ZESTRIL) 20 mg tablet Take 1 Tab by mouth daily.   ??? carisoprodol (SOMA) 350 mg tablet    ??? lidocaine (LIDODERM) 5 %    ??? insulin glargine (LANTUS SOLOSTAR U-100 INSULIN) 100 unit/mL (3 mL) inpn 50 Units by SubCUTAneous route nightly.   ??? insulin aspart U-100 (NOVOLOG FLEXPEN U-100 INSULIN) 100 unit/mL (3 mL) inpn adminitser 10 units subcut three times daily before each meal   ??? metoprolol tartrate (LOPRESSOR) 50 mg tablet Take 1 Tab by mouth two (2) times a day.   ??? aspirin delayed-release 81 mg tablet Take 1 Tab by mouth daily.   ??? diclofenac (VOLTAREN) 1 % gel Apply 4 g to affected area four (4) times daily.   ??? dextroamphetamine-amphetamine (ADDERALL) 20 mg tablet Take 20 mg by  mouth two (2) times a day.   ??? Blood-Glucose Meter (TRUE METRIX GLUCOSE METER) misc Use as directed   ??? albuterol (PROVENTIL HFA) 90 mcg/actuation inhaler Take 1 Puff by inhalation.   ??? albuterol (PROVENTIL VENTOLIN) 2.5 mg /3 mL (0.083 %) nebulizer solution Take 2.5 mg by inhalation.   ??? glucose blood VI test strips (TRUE METRIX GLUCOSE TEST STRIP) strip Use as instructed   ??? FLUoxetine (PROZAC) 20 mg capsule Take 20 mg by mouth daily.   ??? hydrOXYzine HCl (ATARAX) 25 mg tablet Take 25 mg by mouth as needed.   ??? lancets (TRUEPLUS LANCETS) 28 gauge misc Use as directed   ??? rosuvastatin (CRESTOR) 10 mg tablet Take 10 mg by mouth.   ??? traZODone (DESYREL) 100 mg tablet Take 100 mg by mouth nightly as needed.     No current facility-administered medications for this visit.      Health Maintenance   Topic Date Due   ??? Hepatitis C Screening  28-Jun-1965   ??? FOOT EXAM Q1  09/13/1973   ???  EYE EXAM RETINAL OR DILATED  09/13/1973   ??? DTaP/Tdap/Td series (1 - Tdap) 09/13/1984   ??? Shingrix Vaccine Age 19> (1 of 2) 09/13/2013   ??? FOBT Q 1 YEAR AGE 71-75  09/13/2013   ??? Influenza Age 39 to Adult  08/12/2017   ??? HEMOGLOBIN A1C Q6M  01/29/2018   ??? MICROALBUMIN Q1  04/06/2018   ??? LIPID PANEL Q1  04/06/2018   ??? PAP AKA CERVICAL CYTOLOGY  03/06/2019   ??? BREAST CANCER SCRN MAMMOGRAM  04/23/2019   ??? Pneumococcal 0-64 years  Completed     Immunization History   Administered Date(s) Administered   ??? Pneumococcal Polysaccharide (PPSV-23) 04/26/2017     No LMP recorded. Patient has had a hysterectomy.        Allergies and Intolerances:   Allergies   Allergen Reactions   ??? Pyridium [Phenazopyridine] Itching       Family History:   No family history on file.    Social History:   She  reports that she has been smoking. She has a 10.00 pack-year smoking history. She has quit using smokeless tobacco.  She  reports that she drank alcohol.            Review of Systems:   General: negative for - chills, fatigue, fever, weight change   Psych: negative for - anxiety, depression, irritability or mood swings  ENT: negative for - headaches, hearing change, nasal congestion, oral lesions, sneezing or sore throat  Heme/ Lymph: negative for - bleeding problems, bruising, pallor or swollen lymph nodes  Endo: negative for - hot flashes, polydipsia/polyuria or temperature intolerance  Resp: negative for - cough, shortness of breath or wheezing  CV: negative for - chest pain, edema or palpitations  GI: negative for - abdominal pain, change in bowel habits, constipation, diarrhea or nausea/vomiting  GU: negative for - dysuria, hematuria, incontinence, pelvic pain or vulvar/vaginal symptoms  MSK: negative for - joint pain, joint swelling or muscle pain  Neuro: negative for - confusion, headaches, seizures or weakness  Derm: negative for - dry skin, hair changes, rash or skin lesion changes          Physical:   Vitals:   Vitals:    10/14/17 0828   BP: 162/82   Pulse: 60   Resp: 16   Temp: 98.8 ??F (37.1 ??C)   TempSrc: Oral   SpO2: 100%   Weight: 186 lb 6.4 oz (84.6 kg)   Height: 5\' 8"  (1.727 m)           Exam:   HEENT- atraumatic,normocephalic, awake, oriented, well nourished  Neck - supple,no enlarged lymph nodes, no JVD, no thyromegaly, enlarged round , form lymphnode in the neck rt side  Chest- CTA, no rhonchi, no crackles  Heart- rrr, no murmurs / gallop/rub  Abdomen- soft,BS+,NT, no hepatosplenomegaly  Ext - no c/c/edema   Neuro- no focal deficits.Power 5/5 all extremities  Skin - warm,dry, no obvious rashes., small papular lesions on arms          Review of Data:   LABS:   Lab Results   Component Value Date/Time    WBC 7.1 04/05/2017 12:32 PM    HGB 13.5 04/05/2017 12:32 PM    HCT 40.7 04/05/2017 12:32 PM    PLATELET 312 04/05/2017 12:32 PM     Lab Results   Component Value Date/Time    Sodium 142 04/05/2017 12:32 PM    Potassium 4.5 04/05/2017 12:32 PM    Chloride  107 (H) 04/05/2017 12:32 PM    CO2 20 04/05/2017 12:32 PM     Glucose 152 (H) 04/05/2017 12:32 PM    BUN 14 04/05/2017 12:32 PM    Creatinine 0.73 04/05/2017 12:32 PM     Lab Results   Component Value Date/Time    Cholesterol, total 160 04/05/2017 12:32 PM    HDL Cholesterol 61 04/05/2017 12:32 PM    LDL, calculated 77 04/05/2017 12:32 PM    Triglyceride 112 04/05/2017 12:32 PM     No results found for: GPT        Impression / Plan:        ICD-10-CM ICD-9-CM    1. DM type 2, goal HbA1c < 7% (HCC) E11.9 250.00 AMB POC HEMOGLOBIN A1C   2. Itching L29.9 698.9 permethrin (ACTICIN) 5 % topical cream      METABOLIC PANEL, COMPREHENSIVE   3. Essential hypertension I10 401.9 lisinopril (PRINIVIL, ZESTRIL) 20 mg tablet   4. Smoker F17.200 305.1 XR CHEST PA LAT   5. Lymph node enlargement R59.9 785.6 XR CHEST PA LAT      CT NECK SOFT TISSUE W WO CONT     Advised pt to eat 15-30 minutes after taking Novolog to avoid hypoglycemia.    HTN - low salt diet.    Increase lisinopril to 20 mg po daily.    Explained to patient risk benefits of the medications.Advised patient to stop meds if having any side effects.Pt verbalized understanding of the instructions.    I have discussed the diagnosis with the patient and the intended plan as seen in the above orders.  The patient has received an after-visit summary and questions were answered concerning future plans.  I have discussed medication side effects and warnings with the patient as well. I have reviewed the plan of care with the patient, accepted their input and they are in agreement with the treatment goals.     Reviewed plan of care. Patient has provided input and agrees with goals.        Jenene SlickerPravin M Pheng Prokop, MD

## 2017-10-14 NOTE — Progress Notes (Signed)
Chief Complaint   Patient presents with   ??? Skin Problem     bump in nieck, itching bilateral arms, onset with itching for week, onset with bump yesterday   ??? Diabetes   ??? Hypertension   ??? Coronary Artery Disease     1. Have you been to the ER, urgent care clinic since your last visit?  Hospitalized since your last visit?No    2. Have you seen or consulted any other health care providers outside of the [facility] since your last visit?  Include any pap smears or colon screening. No

## 2017-10-15 LAB — COMPREHENSIVE METABOLIC PANEL
ALT: 34 IU/L — ABNORMAL HIGH (ref 0–32)
AST: 24 IU/L (ref 0–40)
Albumin/Globulin Ratio: 1.8 NA (ref 1.2–2.2)
Albumin: 4.6 g/dL (ref 3.5–5.5)
Alkaline Phosphatase: 93 IU/L (ref 39–117)
BUN: 12 mg/dL (ref 6–24)
Bun/Cre Ratio: 16 NA (ref 9–23)
CO2: 23 mmol/L (ref 20–29)
Calcium: 9.2 mg/dL (ref 8.7–10.2)
Chloride: 101 mmol/L (ref 96–106)
Creatinine: 0.73 mg/dL (ref 0.57–1.00)
EGFR IF NonAfrican American: 94 mL/min/{1.73_m2} (ref >59)
GFR African American: 108 mL/min/{1.73_m2} (ref >59)
Globulin, Total: 2.5 g/dL (ref 1.5–4.5)
Glucose: 250 mg/dL — ABNORMAL HIGH (ref 65–99)
Potassium: 4.8 mmol/L (ref 3.5–5.2)
Sodium: 141 mmol/L (ref 134–144)
Total Bilirubin: 0.2 mg/dL (ref 0.0–1.2)
Total Protein: 7.1 g/dL (ref 6.0–8.5)

## 2017-10-15 LAB — METABOLIC PANEL, COMPREHENSIVE
A-G Ratio: 1.8 (ref 1.2–2.2)
ALT (SGPT): 34 IU/L — ABNORMAL HIGH (ref 0–32)
AST (SGOT): 24 IU/L (ref 0–40)
Albumin: 4.6 g/dL (ref 3.5–5.5)
Alk. phosphatase: 93 IU/L (ref 39–117)
BUN/Creatinine ratio: 16 (ref 9–23)
BUN: 12 mg/dL (ref 6–24)
Bilirubin, total: 0.2 mg/dL (ref 0.0–1.2)
CO2: 23 mmol/L (ref 20–29)
Calcium: 9.2 mg/dL (ref 8.7–10.2)
Chloride: 101 mmol/L (ref 96–106)
Creatinine: 0.73 mg/dL (ref 0.57–1.00)
GFR est AA: 108 mL/min/{1.73_m2} (ref >59)
GFR est non-AA: 94 mL/min/{1.73_m2} (ref >59)
GLOBULIN, TOTAL: 2.5 g/dL (ref 1.5–4.5)
Glucose: 250 mg/dL — ABNORMAL HIGH (ref 65–99)
Potassium: 4.8 mmol/L (ref 3.5–5.2)
Protein, total: 7.1 g/dL (ref 6.0–8.5)
Sodium: 141 mmol/L (ref 134–144)

## 2017-10-20 NOTE — Telephone Encounter (Signed)
Please advise

## 2017-10-20 NOTE — Telephone Encounter (Signed)
Pt had reported that PSyhiatry was prescribing her Atarax ( hydroxy azine ). I just want to know the dose of frequency of the med , since it should help with symptoms of itching.

## 2017-10-20 NOTE — Telephone Encounter (Signed)
Please disregard the refill request. Pt does not want to take the Atarax. States she feels very lethargic when taking this. Would like to take something different. Please advise.

## 2017-10-20 NOTE — Telephone Encounter (Signed)
Atarax 25 mg tab

## 2017-10-20 NOTE — Telephone Encounter (Signed)
She can take it upto three times a day and that should help with the itching

## 2017-10-20 NOTE — Telephone Encounter (Signed)
LVM for pt to call office back

## 2017-10-20 NOTE — Telephone Encounter (Signed)
Pt would like to know if she could have something else sent to pharm on file for itching. The current txt is not working. Pt also has an appt on Friday, 10/16/17. Please advise.

## 2017-10-20 NOTE — Telephone Encounter (Signed)
Patient would like to speak to a nurse about a cream that was supposed to be sent to the pharmacy

## 2017-10-21 ENCOUNTER — Encounter

## 2017-10-21 MED ORDER — HYDROXYZINE 25 MG TAB
25 mg | ORAL_TABLET | Freq: Three times a day (TID) | ORAL | 0 refills | Status: DC | PRN
Start: 2017-10-21 — End: 2017-10-22

## 2017-10-21 NOTE — Telephone Encounter (Signed)
Pt used to be on Fluoxetine( Prozac) . It may help with the itching. Is pt still taking Fluoxetine.  Has patient tried moistening creams like Aveeno, Nivea..  If nothing is helping I can refer pt to Dermatology.

## 2017-10-21 NOTE — Telephone Encounter (Signed)
Dermatology referral done

## 2017-10-21 NOTE — Telephone Encounter (Signed)
Pt states she would like to obtain the referral for the dermatologist. Please advise.

## 2017-10-21 NOTE — Telephone Encounter (Signed)
Dermatology referral information given to pt per Dr. Danella Penton.

## 2017-10-22 ENCOUNTER — Ambulatory Visit: Attending: Internal Medicine | Primary: Internal Medicine

## 2017-10-22 ENCOUNTER — Ambulatory Visit
Admit: 2017-10-22 | Discharge: 2017-10-22 | Payer: PRIVATE HEALTH INSURANCE | Attending: Internal Medicine | Primary: Internal Medicine

## 2017-10-22 DIAGNOSIS — L299 Pruritus, unspecified: Secondary | ICD-10-CM

## 2017-10-22 MED ORDER — DIPHENHYDRAMINE 25 MG TAB
25 mg | ORAL_TABLET | Freq: Every evening | ORAL | 0 refills | Status: AC | PRN
Start: 2017-10-22 — End: ?

## 2017-10-22 MED ORDER — TRIAMCINOLONE ACETONIDE 0.1 % TOPICAL CREAM
0.1 % | Freq: Two times a day (BID) | CUTANEOUS | 3 refills | Status: DC
Start: 2017-10-22 — End: 2018-01-24

## 2017-10-22 MED ORDER — AMMONIUM LACTATE 12 % TOPICAL CREAM
12 % | Freq: Two times a day (BID) | CUTANEOUS | 3 refills | Status: DC
Start: 2017-10-22 — End: 2018-01-24

## 2017-10-22 NOTE — Progress Notes (Signed)
Chief Complaint   Patient presents with   . Other     one week follow up for skin     1. Have you been to the ER, urgent care clinic since your last visit?  Hospitalized since your last visit?No    2. Have you seen or consulted any other health care providers outside of the [facility] since your last visit?  Include any pap smears or colon screening. No

## 2017-10-22 NOTE — Progress Notes (Signed)
Cathy [name] is a 52 y.o.  female and presents with     Chief Complaint   Patient presents with   ??? Other     one week follow up for skin   ??? Skin Problem     Pt says she applied permethrin one time but  She could not repeat the treatment as insurance did not approve it the second time.  She still has itching on the dorsal apsect of both forearms.  It itches all the time  She has app with Derm next week.        Past Medical History:   Diagnosis Date   ??? Asthma    ??? Depression    ??? Diabetes (HCC)     Type 2 DM   ??? Hypercholesterolemia    ??? Hypertension      Past Surgical History:   Procedure Laterality Date   ??? HX ACL RECONSTRUCTION      right knee   ??? HX APPENDECTOMY     ??? HX ARTERIAL BYPASS      left main artery bypass   ??? HX HYSTERECTOMY     ??? HX WISDOM TEETH EXTRACTION       Current Outpatient Medications   Medication Sig   ??? ammonium lactate (AMLACTIN) 12 % topical cream Apply  to affected area two (2) times a day. rub in to affected area well   ??? diphenhydrAMINE (BENADRYL ALLERGY) 25 mg tablet Take 1 Tab by mouth nightly as needed for Sleep.   ??? triamcinolone acetonide (KENALOG) 0.1 % topical cream Apply  to affected area two (2) times a day. use thin layer   ??? permethrin (ACTICIN) 5 % topical cream apply sparingly as directed   ??? lisinopril (PRINIVIL, ZESTRIL) 20 mg tablet Take 1 Tab by mouth daily.   ??? carisoprodol (SOMA) 350 mg tablet    ??? lidocaine (LIDODERM) 5 %    ??? insulin glargine (LANTUS SOLOSTAR U-100 INSULIN) 100 unit/mL (3 mL) inpn 50 Units by SubCUTAneous route nightly.   ??? insulin aspart U-100 (NOVOLOG FLEXPEN U-100 INSULIN) 100 unit/mL (3 mL) inpn adminitser 10 units subcut three times daily before each meal   ??? metoprolol tartrate (LOPRESSOR) 50 mg tablet Take 1 Tab by mouth two (2) times a day.   ??? aspirin delayed-release 81 mg tablet Take 1 Tab by mouth daily.   ??? diclofenac (VOLTAREN) 1 % gel Apply 4 g to affected area four (4) times daily.   ??? dextroamphetamine-amphetamine (ADDERALL) 20 mg  tablet Take 20 mg by mouth two (2) times a day.   ??? Blood-Glucose Meter (TRUE METRIX GLUCOSE METER) misc Use as directed   ??? albuterol (PROVENTIL HFA) 90 mcg/actuation inhaler Take 1 Puff by inhalation.   ??? albuterol (PROVENTIL VENTOLIN) 2.5 mg /3 mL (0.083 %) nebulizer solution Take 2.5 mg by inhalation.   ??? glucose blood VI test strips (TRUE METRIX GLUCOSE TEST STRIP) strip Use as instructed   ??? FLUoxetine (PROZAC) 20 mg capsule Take 20 mg by mouth daily.   ??? lancets (TRUEPLUS LANCETS) 28 gauge misc Use as directed   ??? rosuvastatin (CRESTOR) 10 mg tablet Take 10 mg by mouth.   ??? traZODone (DESYREL) 100 mg tablet Take 100 mg by mouth nightly as needed.     No current facility-administered medications for this visit.      Health Maintenance   Topic Date Due   ??? Hepatitis C Screening  05/26/1965   ??? FOOT EXAM Q1  09/13/1973   ???  EYE EXAM RETINAL OR DILATED  09/13/1973   ??? DTaP/Tdap/Td series (1 - Tdap) 09/13/1984   ??? Shingrix Vaccine Age 5> (1 of 2) 09/13/2013   ??? FOBT Q 1 YEAR AGE 74-75  09/13/2013   ??? Influenza Age 19 to Adult  08/12/2017   ??? MICROALBUMIN Q1  04/06/2018   ??? LIPID PANEL Q1  04/06/2018   ??? HEMOGLOBIN A1C Q6M  04/15/2018   ??? PAP AKA CERVICAL CYTOLOGY  03/06/2019   ??? BREAST CANCER SCRN MAMMOGRAM  04/23/2019   ??? Pneumococcal 0-64 years  Completed     Immunization History   Administered Date(s) Administered   ??? Pneumococcal Polysaccharide (PPSV-23) 04/26/2017     No LMP recorded. Patient has had a hysterectomy.        Allergies and Intolerances:   Allergies   Allergen Reactions   ??? Pyridium [Phenazopyridine] Itching       Family History:   No family history on file.    Social History:   She  reports that she has been smoking. She has a 10.00 pack-year smoking history. She has quit using smokeless tobacco.  She  reports that she drank alcohol.            Review of Systems:   General: negative for - chills, fatigue, fever, weight change  Psych: negative for - anxiety, depression, irritability or mood  swings  ENT: negative for - headaches, hearing change, nasal congestion, oral lesions, sneezing or sore throat  Heme/ Lymph: negative for - bleeding problems, bruising, pallor or swollen lymph nodes  Endo: negative for - hot flashes, polydipsia/polyuria or temperature intolerance  Resp: negative for - cough, shortness of breath or wheezing  CV: negative for - chest pain, edema or palpitations  GI: negative for - abdominal pain, change in bowel habits, constipation, diarrhea or nausea/vomiting  GU: negative for - dysuria, hematuria, incontinence, pelvic pain or vulvar/vaginal symptoms  MSK: negative for - joint pain, joint swelling or muscle pain  Neuro: negative for - confusion, headaches, seizures or weakness  Derm: negative for - dry skin, hair changes, rash or skin lesion changes          Physical:   Vitals:   Vitals:    10/22/17 0806   BP: 121/84   Pulse: 64   Resp: 16   Temp: 98.8 ??F (37.1 ??C)   TempSrc: Oral   SpO2: 100%   Weight: 185 lb (83.9 kg)   Height: 5\' 8"  (1.727 m)           Exam:   HEENT- atraumatic,normocephalic, awake, oriented, well nourished  Neck - supple,no enlarged lymph nodes, no JVD, no thyromegaly  Chest- CTA, no rhonchi, no crackles  Heart- rrr, no murmurs / gallop/rub  Abdomen- soft,BS+,NT, no hepatosplenomegaly  Ext - no c/c/edema   Neuro- no focal deficits.Power 5/5 all extremities  Skin - warm,dry, no obvious rashes, hyperpipmenation on the dorsal aspect of both forearms          Review of Data:   LABS:   Lab Results   Component Value Date/Time    WBC 7.1 04/05/2017 12:32 PM    HGB 13.5 04/05/2017 12:32 PM    HCT 40.7 04/05/2017 12:32 PM    PLATELET 312 04/05/2017 12:32 PM     Lab Results   Component Value Date/Time    Sodium 141 10/14/2017 08:53 AM    Potassium 4.8 10/14/2017 08:53 AM    Chloride 101 10/14/2017 08:53 AM    CO2 23  10/14/2017 08:53 AM    Glucose 250 (H) 10/14/2017 08:53 AM    BUN 12 10/14/2017 08:53 AM    Creatinine 0.73 10/14/2017 08:53 AM     Lab Results   Component  Value Date/Time    Cholesterol, total 160 04/05/2017 12:32 PM    HDL Cholesterol 61 04/05/2017 12:32 PM    LDL, calculated 77 04/05/2017 12:32 PM    Triglyceride 112 04/05/2017 12:32 PM     No results found for: GPT        Impression / Plan:        ICD-10-CM ICD-9-CM    1. Pruritus L29.9 698.9 ammonium lactate (AMLACTIN) 12 % topical cream   2. 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    Explained to patient risk benefits of the medications.Advised patient to stop meds if having any side effects.Pt verbalized understanding of the instructions.    I have discussed the diagnosis with the patient and the intended plan as seen in the above orders.  The patient has received an after-visit summary and questions were answered concerning future plans.  I have discussed medication side effects and warnings with the patient as well. I have reviewed the plan of care with the patient, accepted their input and they are in agreement with the treatment goals.     Reviewed plan of care. Patient has provided input and agrees with goals.    Follow-up and Dispositions    ?? Return in about 3 months (around 01/22/2018).         Jenene Slicker, MD

## 2017-10-22 NOTE — Progress Notes (Signed)
Chief Complaint   Patient presents with   ??? Other     one week follow up for skin     1. Have you been to the ER, urgent care clinic since your last visit?  Hospitalized since your last visit?No    2. Have you seen or consulted any other health care providers outside of the [facility] since your last visit?  Include any pap smears or colon screening. No

## 2017-10-22 NOTE — Telephone Encounter (Signed)
Pharmacy called = = the script for Ammonium lactate topical cream    Written for 280 grams  Pharmacy only has 385 grams    Can it be changed and refills adjusted?    [facility]   Doctor's line:  [phone]  FAX:  [phone]

## 2017-10-22 NOTE — Telephone Encounter (Signed)
Please advise

## 2017-10-22 NOTE — Progress Notes (Signed)
Cathy [name] is a 52 y.o.  female and presents with     Chief Complaint   Patient presents with   ??? Other     one week follow up for skin   ??? Skin Problem     Pt says she applied permethrin one time but  She could not repeat the treatment as insurance did not approve it the second time.  She still has itching on the dorsal apsect of both forearms.  It itches all the time  She has app with Derm next week.        Past Medical History:   Diagnosis Date   ??? Asthma    ??? Depression    ??? Diabetes (HCC)     Type 2 DM   ??? Hypercholesterolemia    ??? Hypertension      Past Surgical History:   Procedure Laterality Date   ??? HX ACL RECONSTRUCTION      right knee   ??? HX APPENDECTOMY     ??? HX ARTERIAL BYPASS      left main artery bypass   ??? HX HYSTERECTOMY     ??? HX WISDOM TEETH EXTRACTION       Current Outpatient Medications   Medication Sig   ??? ammonium lactate (AMLACTIN) 12 % topical cream Apply  to affected area two (2) times a day. rub in to affected area well   ??? diphenhydrAMINE (BENADRYL ALLERGY) 25 mg tablet Take 1 Tab by mouth nightly as needed for Sleep.   ??? triamcinolone acetonide (KENALOG) 0.1 % topical cream Apply  to affected area two (2) times a day. use thin layer   ??? permethrin (ACTICIN) 5 % topical cream apply sparingly as directed   ??? lisinopril (PRINIVIL, ZESTRIL) 20 mg tablet Take 1 Tab by mouth daily.   ??? carisoprodol (SOMA) 350 mg tablet    ??? lidocaine (LIDODERM) 5 %    ??? insulin glargine (LANTUS SOLOSTAR U-100 INSULIN) 100 unit/mL (3 mL) inpn 50 Units by SubCUTAneous route nightly.   ??? insulin aspart U-100 (NOVOLOG FLEXPEN U-100 INSULIN) 100 unit/mL (3 mL) inpn adminitser 10 units subcut three times daily before each meal   ??? metoprolol tartrate (LOPRESSOR) 50 mg tablet Take 1 Tab by mouth two (2) times a day.   ??? aspirin delayed-release 81 mg tablet Take 1 Tab by mouth daily.   ??? diclofenac (VOLTAREN) 1 % gel Apply 4 g to affected area four (4) times daily.    ??? dextroamphetamine-amphetamine (ADDERALL) 20 mg tablet Take 20 mg by mouth two (2) times a day.   ??? Blood-Glucose Meter (TRUE METRIX GLUCOSE METER) misc Use as directed   ??? albuterol (PROVENTIL HFA) 90 mcg/actuation inhaler Take 1 Puff by inhalation.   ??? albuterol (PROVENTIL VENTOLIN) 2.5 mg /3 mL (0.083 %) nebulizer solution Take 2.5 mg by inhalation.   ??? glucose blood VI test strips (TRUE METRIX GLUCOSE TEST STRIP) strip Use as instructed   ??? FLUoxetine (PROZAC) 20 mg capsule Take 20 mg by mouth daily.   ??? lancets (TRUEPLUS LANCETS) 28 gauge misc Use as directed   ??? rosuvastatin (CRESTOR) 10 mg tablet Take 10 mg by mouth.   ??? traZODone (DESYREL) 100 mg tablet Take 100 mg by mouth nightly as needed.     No current facility-administered medications for this visit.      Health Maintenance   Topic Date Due   ??? Hepatitis C Screening  Jan 17, 1965   ??? FOOT EXAM Q1  09/13/1973   ???  EYE EXAM RETINAL OR DILATED  09/13/1973   ??? DTaP/Tdap/Td series (1 - Tdap) 09/13/1984   ??? Shingrix Vaccine Age 37> (1 of 2) 09/13/2013   ??? FOBT Q 1 YEAR AGE 78-75  09/13/2013   ??? Influenza Age 21 to Adult  08/12/2017   ??? MICROALBUMIN Q1  04/06/2018   ??? LIPID PANEL Q1  04/06/2018   ??? HEMOGLOBIN A1C Q6M  04/15/2018   ??? PAP AKA CERVICAL CYTOLOGY  03/06/2019   ??? BREAST CANCER SCRN MAMMOGRAM  04/23/2019   ??? Pneumococcal 0-64 years  Completed     Immunization History   Administered Date(s) Administered   ??? Pneumococcal Polysaccharide (PPSV-23) 04/26/2017     No LMP recorded. Patient has had a hysterectomy.        Allergies and Intolerances:   Allergies   Allergen Reactions   ??? Pyridium [Phenazopyridine] Itching       Family History:   No family history on file.    Social History:   She  reports that she has been smoking. She has a 10.00 pack-year smoking history. She has quit using smokeless tobacco.  She  reports that she drank alcohol.            Review of Systems:   General: negative for - chills, fatigue, fever, weight change   Psych: negative for - anxiety, depression, irritability or mood swings  ENT: negative for - headaches, hearing change, nasal congestion, oral lesions, sneezing or sore throat  Heme/ Lymph: negative for - bleeding problems, bruising, pallor or swollen lymph nodes  Endo: negative for - hot flashes, polydipsia/polyuria or temperature intolerance  Resp: negative for - cough, shortness of breath or wheezing  CV: negative for - chest pain, edema or palpitations  GI: negative for - abdominal pain, change in bowel habits, constipation, diarrhea or nausea/vomiting  GU: negative for - dysuria, hematuria, incontinence, pelvic pain or vulvar/vaginal symptoms  MSK: negative for - joint pain, joint swelling or muscle pain  Neuro: negative for - confusion, headaches, seizures or weakness  Derm: negative for - dry skin, hair changes, rash or skin lesion changes          Physical:   Vitals:   Vitals:    10/22/17 0806   BP: 121/84   Pulse: 64   Resp: 16   Temp: 98.8 ??F (37.1 ??C)   TempSrc: Oral   SpO2: 100%   Weight: 185 lb (83.9 kg)   Height: 5\' 8"  (1.727 m)           Exam:   HEENT- atraumatic,normocephalic, awake, oriented, well nourished  Neck - supple,no enlarged lymph nodes, no JVD, no thyromegaly  Chest- CTA, no rhonchi, no crackles  Heart- rrr, no murmurs / gallop/rub  Abdomen- soft,BS+,NT, no hepatosplenomegaly  Ext - no c/c/edema   Neuro- no focal deficits.Power 5/5 all extremities  Skin - warm,dry, no obvious rashes, hyperpipmenation on the dorsal aspect of both forearms          Review of Data:   LABS:   Lab Results   Component Value Date/Time    WBC 7.1 04/05/2017 12:32 PM    HGB 13.5 04/05/2017 12:32 PM    HCT 40.7 04/05/2017 12:32 PM    PLATELET 312 04/05/2017 12:32 PM     Lab Results   Component Value Date/Time    Sodium 141 10/14/2017 08:53 AM    Potassium 4.8 10/14/2017 08:53 AM    Chloride 101 10/14/2017 08:53 AM    CO2 23  10/14/2017 08:53 AM    Glucose 250 (H) 10/14/2017 08:53 AM    BUN 12 10/14/2017 08:53 AM     Creatinine 0.73 10/14/2017 08:53 AM     Lab Results   Component Value Date/Time    Cholesterol, total 160 04/05/2017 12:32 PM    HDL Cholesterol 61 04/05/2017 12:32 PM    LDL, calculated 77 04/05/2017 12:32 PM    Triglyceride 112 04/05/2017 12:32 PM     No results found for: GPT        Impression / Plan:        ICD-10-CM ICD-9-CM    1. Pruritus L29.9 698.9 ammonium lactate (AMLACTIN) 12 % topical cream   2. 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    Explained to patient risk benefits of the medications.Advised patient to stop meds if having any side effects.Pt verbalized understanding of the instructions.    I have discussed the diagnosis with the patient and the intended plan as seen in the above orders.  The patient has received an after-visit summary and questions were answered concerning future plans.  I have discussed medication side effects and warnings with the patient as well. I have reviewed the plan of care with the patient, accepted their input and they are in agreement with the treatment goals.     Reviewed plan of care. Patient has provided input and agrees with goals.    Follow-up and Dispositions    ?? Return in about 3 months (around 01/22/2018).         Jenene SlickerPravin M Esabella Stockinger, MD

## 2017-10-22 NOTE — Telephone Encounter (Signed)
385 gm is fine

## 2017-10-22 NOTE — Telephone Encounter (Signed)
As per Dr.Deshmukh, 385 gm if fine. [facility]vey[facility] message to [facility] on [facility].

## 2017-10-26 ENCOUNTER — Ambulatory Visit: Primary: Internal Medicine

## 2017-10-29 ENCOUNTER — Encounter: Attending: Internal Medicine | Primary: Internal Medicine

## 2017-11-10 ENCOUNTER — Ambulatory Visit: Payer: MEDICAID | Primary: Internal Medicine

## 2017-11-18 ENCOUNTER — Encounter: Payer: Self-pay | Admitting: Cardiovascular Disease

## 2017-11-23 ENCOUNTER — Encounter

## 2017-11-23 NOTE — Telephone Encounter (Signed)
LVM for pt to call office back

## 2017-11-23 NOTE — Telephone Encounter (Signed)
Ordered neck ultrasound

## 2017-11-23 NOTE — Telephone Encounter (Signed)
Per pt's insurance the case was denied. Insurance company will need to be contacted for an appeal. [employer] [phone].  They did not want the information for the [place].

## 2017-11-23 NOTE — Telephone Encounter (Signed)
Patients cat scan was denied, needs a peer to peer. Insurance is stating they want an ultrasound done first.     Contact: [phone]  Case: [id number]

## 2017-11-23 NOTE — Telephone Encounter (Signed)
I would like to see the pt in follow up and check if she still has any enlarge Lymphnodes.  I saw her on Otober 3rd. I can see her this week or next week and re assess .

## 2017-11-24 NOTE — Telephone Encounter (Signed)
[facility]. Post her heart catheterization ,, a nerve was pinched during the procedure and she had suffered some nerve damage. [facility] Pt states she will call back for a follow up appt once she is feeling better.

## 2017-11-29 ENCOUNTER — Inpatient Hospital Stay: Admit: 2017-11-29 | Payer: MEDICAID | Attending: Internal Medicine | Primary: Internal Medicine

## 2017-11-29 ENCOUNTER — Inpatient Hospital Stay: Payer: MEDICAID | Attending: Internal Medicine | Primary: Internal Medicine

## 2017-11-29 ENCOUNTER — Encounter

## 2017-11-29 DIAGNOSIS — R590 Localized enlarged lymph nodes: Secondary | ICD-10-CM

## 2017-11-30 ENCOUNTER — Ambulatory Visit: Attending: Internal Medicine | Primary: Internal Medicine

## 2017-11-30 ENCOUNTER — Ambulatory Visit
Admit: 2017-11-30 | Discharge: 2017-11-30 | Payer: PRIVATE HEALTH INSURANCE | Attending: Internal Medicine | Primary: Internal Medicine

## 2017-11-30 DIAGNOSIS — M545 Low back pain, unspecified: Secondary | ICD-10-CM

## 2017-11-30 LAB — AMB POC URINALYSIS DIP STICK AUTO W/O MICRO
Bilirubin (UA POC): NEGATIVE
Bilirubin, Urine, POC: NEGATIVE
Blood (UA POC): NEGATIVE
Blood (UA POC): NEGATIVE
Glucose (UA POC): NEGATIVE
Glucose, Urine, POC: NEGATIVE
Ketones (UA POC): NEGATIVE
Ketones, Urine, POC: NEGATIVE
Nitrite, Urine, POC: NEGATIVE
Nitrites (UA POC): NEGATIVE
Specific Gravity, Urine, POC: 1.03 NA (ref 1.001–1.035)
Specific gravity (UA POC): 1.03 (ref 1.001–1.035)
Urobilinogen (UA POC): 0.2 (ref 0.2–1)
Urobilinogen, POC: 0.2 (ref 0.2–1)
pH (UA POC): 6 (ref 4.6–8.0)
pH, Urine, POC: 6 NA (ref 4.6–8.0)

## 2017-11-30 MED ORDER — HYDROCODONE-ACETAMINOPHEN 5 MG-325 MG TAB
5-325 mg | ORAL_TABLET | Freq: Three times a day (TID) | ORAL | 0 refills | Status: AC | PRN
Start: 2017-11-30 — End: 2017-12-03

## 2017-11-30 MED ORDER — CEPHALEXIN 500 MG CAP
500 mg | ORAL_CAPSULE | Freq: Three times a day (TID) | ORAL | 0 refills | Status: AC
Start: 2017-11-30 — End: 2017-12-10

## 2017-11-30 MED ORDER — NALOXONE 4 MG/ACTUATION NASAL SPRAY
4 mg/actuation | NASAL | 0 refills | Status: AC
Start: 2017-11-30 — End: ?

## 2017-11-30 NOTE — Progress Notes (Signed)
 Chief Complaint   Patient presents with   . Hospital Follow Up     follow up, limb saving surgery and catherization     1. Have you been to the ER, urgent care clinic since your last visit?  Hospitalized since your last visit?Yes When: Nov 2019 Where: [facility] Reason for visit: heart catherization    2. Have you seen or consulted any other health care providers outside of the [facility] since your last visit?  Include any pap smears or colon screening. No

## 2017-11-30 NOTE — Progress Notes (Signed)
Cathy [name] is a 52 y.o.  female and presents with     Chief Complaint   Patient presents with   ??? Hospital Follow Up     follow up, limb saving surgery and catherization   ??? Cough     over week    ??? Generalized Body Aches     no fever currently    ??? Leg Pain     right leg discomfort   ??? Coronary Artery Disease   ??? Diabetes   ??? Cholesterol Problem     Pt saw cardiology in [facility] on Novemebr 6th. Pt was scheduled for cardiac cath for possible blocked artery.  Pt had angiogram. However it ended up causing blockagoe fo the blood supply to rt leg.  Pt says she had emergency limb saving surgery.  Pt today has rt shoulder pai. Pt sasys she was supposed to get rt shoulder surgery but has not had it yet.  Pt was getting OT/Pt but was told that it is not helping her.  Also has lower back pain and sciatica.  Pt thinks she may be coming down with something.  Pt has pain in the rt groin region where she had the cath.  Pt says she is not constipated and does not have rectal bleeding , fever.        Past Medical History:   Diagnosis Date   ??? Asthma    ??? Depression    ??? Diabetes (HCC)     Type 2 DM   ??? Hypercholesterolemia    ??? Hypertension      Past Surgical History:   Procedure Laterality Date   ??? CARDIAC SURG PROCEDURE UNLIST  2019    heart catherization    ??? HX ACL RECONSTRUCTION      right knee   ??? HX APPENDECTOMY     ??? HX ARTERIAL BYPASS      left main artery bypass   ??? HX HYSTERECTOMY     ??? HX WISDOM TEETH EXTRACTION       Current Outpatient Medications   Medication Sig   ??? cephALEXin (KEFLEX) 500 mg capsule Take 1 Cap by mouth three (3) times daily for 10 days.   ??? HYDROcodone-acetaminophen (NORCO) 5-325 mg per tablet Take 1 Tab by mouth every eight (8) hours as needed for Pain for up to 3 days. Max Daily Amount: 3 Tabs.   ??? naloxone (NARCAN) 4 mg/actuation nasal spray Use 1 spray intranasally, then discard. Repeat with new spray every 2 min as needed for opioid overdose symptoms, alternating  nostrils.   ??? ammonium lactate (AMLACTIN) 12 % topical cream Apply  to affected area two (2) times a day. rub in to affected area well   ??? diphenhydrAMINE (BENADRYL ALLERGY) 25 mg tablet Take 1 Tab by mouth nightly as needed for Sleep.   ??? triamcinolone acetonide (KENALOG) 0.1 % topical cream Apply  to affected area two (2) times a day. use thin layer   ??? permethrin (ACTICIN) 5 % topical cream apply sparingly as directed   ??? lisinopril (PRINIVIL, ZESTRIL) 20 mg tablet Take 1 Tab by mouth daily.   ??? carisoprodol (SOMA) 350 mg tablet    ??? lidocaine (LIDODERM) 5 %    ??? insulin glargine (LANTUS SOLOSTAR U-100 INSULIN) 100 unit/mL (3 mL) inpn 50 Units by SubCUTAneous route nightly.   ??? insulin aspart U-100 (NOVOLOG FLEXPEN U-100 INSULIN) 100 unit/mL (3 mL) inpn adminitser 10 units subcut three times daily before  each meal   ??? metoprolol tartrate (LOPRESSOR) 50 mg tablet Take 1 Tab by mouth two (2) times a day.   ??? aspirin delayed-release 81 mg tablet Take 1 Tab by mouth daily.   ??? diclofenac (VOLTAREN) 1 % gel Apply 4 g to affected area four (4) times daily.   ??? dextroamphetamine-amphetamine (ADDERALL) 20 mg tablet Take 20 mg by mouth two (2) times a day.   ??? Blood-Glucose Meter (TRUE METRIX GLUCOSE METER) misc Use as directed   ??? albuterol (PROVENTIL HFA) 90 mcg/actuation inhaler Take 1 Puff by inhalation.   ??? albuterol (PROVENTIL VENTOLIN) 2.5 mg /3 mL (0.083 %) nebulizer solution Take 2.5 mg by inhalation.   ??? glucose blood VI test strips (TRUE METRIX GLUCOSE TEST STRIP) strip Use as instructed   ??? FLUoxetine (PROZAC) 20 mg capsule Take 20 mg by mouth daily.   ??? lancets (TRUEPLUS LANCETS) 28 gauge misc Use as directed   ??? rosuvastatin (CRESTOR) 10 mg tablet Take 10 mg by mouth.   ??? traZODone (DESYREL) 100 mg tablet Take 100 mg by mouth nightly as needed.     No current facility-administered medications for this visit.      Health Maintenance   Topic Date Due   ??? Hepatitis C Screening  03-07-1965   ??? FOOT EXAM Q1   09/13/1973   ??? EYE EXAM RETINAL OR DILATED  09/13/1973   ??? DTaP/Tdap/Td series (1 - Tdap) 09/13/1984   ??? Shingrix Vaccine Age 22> (1 of 2) 09/13/2013   ??? FOBT Q 1 YEAR AGE 65-75  09/13/2013   ??? Influenza Age 53 to Adult  03/24/2018 (Originally 08/12/2017)   ??? MICROALBUMIN Q1  04/06/2018   ??? LIPID PANEL Q1  04/06/2018   ??? HEMOGLOBIN A1C Q6M  04/15/2018   ??? PAP AKA CERVICAL CYTOLOGY  03/06/2019   ??? BREAST CANCER SCRN MAMMOGRAM  04/23/2019   ??? Pneumococcal 0-64 years  Completed     Immunization History   Administered Date(s) Administered   ??? Pneumococcal Polysaccharide (PPSV-23) 04/26/2017     No LMP recorded. Patient has had a hysterectomy.        Allergies and Intolerances:   Allergies   Allergen Reactions   ??? Pyridium [Phenazopyridine] Itching       Family History:   No family history on file.    Social History:   She  reports that she has been smoking. She has a 10.00 pack-year smoking history. She has quit using smokeless tobacco.  She  reports that she drank alcohol.            Review of Systems: pos for rt shoulder pain, left upper abdominal pain   General: negative for - chills, fatigue, fever, weight change  Psych: negative for - anxiety, depression, irritability or mood swings  ENT: negative for - headaches, hearing change, nasal congestion, oral lesions, sneezing or sore throat  Heme/ Lymph: negative for - bleeding problems, bruising, pallor or swollen lymph nodes  Endo: negative for - hot flashes, polydipsia/polyuria or temperature intolerance  Resp: negative for - cough, shortness of breath or wheezing  CV: negative for - chest pain, edema or palpitations  GI: negative for - abdominal pain, change in bowel habits, constipation, diarrhea or nausea/vomiting  GU: negative for - dysuria, hematuria, incontinence, pelvic pain or vulvar/vaginal symptoms  MSK: negative for - joint pain, joint swelling or muscle pain  Neuro: negative for - confusion, headaches, seizures or weakness  Derm: negative for - dry skin, hair  changes, rash or skin lesion changes          Physical:   Vitals:   Vitals:    11/30/17 1424   BP: 138/83   Pulse: 84   Resp: 16   Temp: 98.8 ??F (37.1 ??C)   TempSrc: Oral   SpO2: 100%   Weight: 185 lb 9.6 oz (84.2 kg)   Height: 5\' 8"  (1.727 m)           Exam:   HEENT- atraumatic,normocephalic, awake, oriented, well nourished  Neck - supple,no enlarged lymph nodes, no JVD, no thyromegaly  Chest- CTA, no rhonchi, no crackles  Heart- rrr, no murmurs / gallop/rub  Abdomen- soft,BS+,NT, no hepatosplenomegaly,left upper quadrant tenderness +  Ext - no c/c/edema   Neuro- no focal deficits.Power 5/5 all extremities  Skin - warm,dry, no obvious rashes.    Left CVA tendnerness,  Rt groin - swollen ,tender    Review of Data:   LABS:   Lab Results   Component Value Date/Time    WBC 7.1 04/05/2017 12:32 PM    HGB 13.5 04/05/2017 12:32 PM    HCT 40.7 04/05/2017 12:32 PM    PLATELET 312 04/05/2017 12:32 PM     Lab Results   Component Value Date/Time    Sodium 141 10/14/2017 08:53 AM    Potassium 4.8 10/14/2017 08:53 AM    Chloride 101 10/14/2017 08:53 AM    CO2 23 10/14/2017 08:53 AM    Glucose 250 (H) 10/14/2017 08:53 AM    BUN 12 10/14/2017 08:53 AM    Creatinine 0.73 10/14/2017 08:53 AM     Lab Results   Component Value Date/Time    Cholesterol, total 160 04/05/2017 12:32 PM    HDL Cholesterol 61 04/05/2017 12:32 PM    LDL, calculated 77 04/05/2017 12:32 PM    Triglyceride 112 04/05/2017 12:32 PM     No results found for: GPT        Impression / Plan:        ICD-10-CM ICD-9-CM    1. Acute left-sided low back pain without sciatica M54.5 724.2 AMB POC URINALYSIS DIP STICK AUTO W/O MICRO      CBC WITH AUTOMATED DIFF      METABOLIC PANEL, COMPREHENSIVE      CULTURE, URINE      HYDROcodone-acetaminophen (NORCO) 5-325 mg per tablet      naloxone (NARCAN) 4 mg/actuation nasal spray   2. Right inguinal pain R10.31 789.03 CBC WITH AUTOMATED DIFF      METABOLIC PANEL, COMPREHENSIVE      cephALEXin (KEFLEX) 500 mg capsule   3. Posterior  cervical lymphadenopathy R59.0 785.6 REFERRAL TO ENT-OTOLARYNGOLOGY   4. Left upper quadrant pain R10.12 789.02 LIPASE      HYDROcodone-acetaminophen (NORCO) 5-325 mg per tablet   5. Chronic right shoulder pain M25.511 719.41 HYDROcodone-acetaminophen (NORCO) 5-325 mg per tablet    G89.29 338.29 naloxone (NARCAN) 4 mg/actuation nasal spray     PMP reviewed.    Pt sees PSych for ADHD.      Explained to patient risk benefits of the medications.Advised patient to stop meds if having any side effects.Pt verbalized understanding of the instructions.    I have discussed the diagnosis with the patient and the intended plan as seen in the above orders.  The patient has received an after-visit summary and questions were answered concerning future plans.  I have discussed medication side effects and warnings with the patient as well. I have reviewed the plan  of care with the patient, accepted their input and they are in agreement with the treatment goals.     Reviewed plan of care. Patient has provided input and agrees with goals.    Follow-up and Dispositions    ?? Return in about 1 month (around 12/30/2017).         Jenene Slicker, MD

## 2017-11-30 NOTE — Progress Notes (Signed)
Cathy [name] is a 52 y.o.  female and presents with     Chief Complaint   Patient presents with   ??? Hospital Follow Up     follow up, limb saving surgery and catherization   ??? Cough     over week    ??? Generalized Body Aches     no fever currently    ??? Leg Pain     right leg discomfort   ??? Coronary Artery Disease   ??? Diabetes   ??? Cholesterol Problem     Pt saw cardiology in [facility] on Novemebr 6th. Pt was scheduled for cardiac cath for possible blocked artery.  Pt had angiogram. However it ended up causing blockagoe fo the blood supply to rt leg.  Pt says she had emergency limb saving surgery.  Pt today has rt shoulder pai. Pt sasys she was supposed to get rt shoulder surgery but has not had it yet.  Pt was getting OT/Pt but was told that it is not helping her.  Also has lower back pain and sciatica.  Pt thinks she may be coming down with something.  Pt has pain in the rt groin region where she had the cath.  Pt says she is not constipated and does not have rectal bleeding , fever.        Past Medical History:   Diagnosis Date   ??? Asthma    ??? Depression    ??? Diabetes (HCC)     Type 2 DM   ??? Hypercholesterolemia    ??? Hypertension      Past Surgical History:   Procedure Laterality Date   ??? CARDIAC SURG PROCEDURE UNLIST  2019    heart catherization    ??? HX ACL RECONSTRUCTION      right knee   ??? HX APPENDECTOMY     ??? HX ARTERIAL BYPASS      left main artery bypass   ??? HX HYSTERECTOMY     ??? HX WISDOM TEETH EXTRACTION       Current Outpatient Medications   Medication Sig   ??? cephALEXin (KEFLEX) 500 mg capsule Take 1 Cap by mouth three (3) times daily for 10 days.   ??? HYDROcodone-acetaminophen (NORCO) 5-325 mg per tablet Take 1 Tab by mouth every eight (8) hours as needed for Pain for up to 3 days. Max Daily Amount: 3 Tabs.   ??? naloxone (NARCAN) 4 mg/actuation nasal spray Use 1 spray intranasally, then discard. Repeat with new spray every 2 min as needed for opioid  overdose symptoms, alternating nostrils.   ??? ammonium lactate (AMLACTIN) 12 % topical cream Apply  to affected area two (2) times a day. rub in to affected area well   ??? diphenhydrAMINE (BENADRYL ALLERGY) 25 mg tablet Take 1 Tab by mouth nightly as needed for Sleep.   ??? triamcinolone acetonide (KENALOG) 0.1 % topical cream Apply  to affected area two (2) times a day. use thin layer   ??? permethrin (ACTICIN) 5 % topical cream apply sparingly as directed   ??? lisinopril (PRINIVIL, ZESTRIL) 20 mg tablet Take 1 Tab by mouth daily.   ??? carisoprodol (SOMA) 350 mg tablet    ??? lidocaine (LIDODERM) 5 %    ??? insulin glargine (LANTUS SOLOSTAR U-100 INSULIN) 100 unit/mL (3 mL) inpn 50 Units by SubCUTAneous route nightly.   ??? insulin aspart U-100 (NOVOLOG FLEXPEN U-100 INSULIN) 100 unit/mL (3 mL) inpn adminitser 10 units subcut three times daily before  each meal   ??? metoprolol tartrate (LOPRESSOR) 50 mg tablet Take 1 Tab by mouth two (2) times a day.   ??? aspirin delayed-release 81 mg tablet Take 1 Tab by mouth daily.   ??? diclofenac (VOLTAREN) 1 % gel Apply 4 g to affected area four (4) times daily.   ??? dextroamphetamine-amphetamine (ADDERALL) 20 mg tablet Take 20 mg by mouth two (2) times a day.   ??? Blood-Glucose Meter (TRUE METRIX GLUCOSE METER) misc Use as directed   ??? albuterol (PROVENTIL HFA) 90 mcg/actuation inhaler Take 1 Puff by inhalation.   ??? albuterol (PROVENTIL VENTOLIN) 2.5 mg /3 mL (0.083 %) nebulizer solution Take 2.5 mg by inhalation.   ??? glucose blood VI test strips (TRUE METRIX GLUCOSE TEST STRIP) strip Use as instructed   ??? FLUoxetine (PROZAC) 20 mg capsule Take 20 mg by mouth daily.   ??? lancets (TRUEPLUS LANCETS) 28 gauge misc Use as directed   ??? rosuvastatin (CRESTOR) 10 mg tablet Take 10 mg by mouth.   ??? traZODone (DESYREL) 100 mg tablet Take 100 mg by mouth nightly as needed.     No current facility-administered medications for this visit.      Health Maintenance   Topic Date Due    ??? Hepatitis C Screening  January 06, 1966   ??? FOOT EXAM Q1  09/13/1973   ??? EYE EXAM RETINAL OR DILATED  09/13/1973   ??? DTaP/Tdap/Td series (1 - Tdap) 09/13/1984   ??? Shingrix Vaccine Age 26> (1 of 2) 09/13/2013   ??? FOBT Q 1 YEAR AGE 52-75  09/13/2013   ??? Influenza Age 599 to Adult  03/24/2018 (Originally 08/12/2017)   ??? MICROALBUMIN Q1  04/06/2018   ??? LIPID PANEL Q1  04/06/2018   ??? HEMOGLOBIN A1C Q6M  04/15/2018   ??? PAP AKA CERVICAL CYTOLOGY  03/06/2019   ??? BREAST CANCER SCRN MAMMOGRAM  04/23/2019   ??? Pneumococcal 0-64 years  Completed     Immunization History   Administered Date(s) Administered   ??? Pneumococcal Polysaccharide (PPSV-23) 04/26/2017     No LMP recorded. Patient has had a hysterectomy.        Allergies and Intolerances:   Allergies   Allergen Reactions   ??? Pyridium [Phenazopyridine] Itching       Family History:   No family history on file.    Social History:   She  reports that she has been smoking. She has a 10.00 pack-year smoking history. She has quit using smokeless tobacco.  She  reports that she drank alcohol.            Review of Systems: pos for rt shoulder pain, left upper abdominal pain   General: negative for - chills, fatigue, fever, weight change  Psych: negative for - anxiety, depression, irritability or mood swings  ENT: negative for - headaches, hearing change, nasal congestion, oral lesions, sneezing or sore throat  Heme/ Lymph: negative for - bleeding problems, bruising, pallor or swollen lymph nodes  Endo: negative for - hot flashes, polydipsia/polyuria or temperature intolerance  Resp: negative for - cough, shortness of breath or wheezing  CV: negative for - chest pain, edema or palpitations  GI: negative for - abdominal pain, change in bowel habits, constipation, diarrhea or nausea/vomiting  GU: negative for - dysuria, hematuria, incontinence, pelvic pain or vulvar/vaginal symptoms  MSK: negative for - joint pain, joint swelling or muscle pain   Neuro: negative for - confusion, headaches, seizures or weakness  Derm: negative for - dry skin,  hair changes, rash or skin lesion changes          Physical:   Vitals:   Vitals:    11/30/17 1424   BP: 138/83   Pulse: 84   Resp: 16   Temp: 98.8 ??F (37.1 ??C)   TempSrc: Oral   SpO2: 100%   Weight: 185 lb 9.6 oz (84.2 kg)   Height: 5\' 8"  (1.727 m)           Exam:   HEENT- atraumatic,normocephalic, awake, oriented, well nourished  Neck - supple,no enlarged lymph nodes, no JVD, no thyromegaly  Chest- CTA, no rhonchi, no crackles  Heart- rrr, no murmurs / gallop/rub  Abdomen- soft,BS+,NT, no hepatosplenomegaly,left upper quadrant tenderness +  Ext - no c/c/edema   Neuro- no focal deficits.Power 5/5 all extremities  Skin - warm,dry, no obvious rashes.    Left CVA tendnerness,  Rt groin - swollen ,tender    Review of Data:   LABS:   Lab Results   Component Value Date/Time    WBC 7.1 04/05/2017 12:32 PM    HGB 13.5 04/05/2017 12:32 PM    HCT 40.7 04/05/2017 12:32 PM    PLATELET 312 04/05/2017 12:32 PM     Lab Results   Component Value Date/Time    Sodium 141 10/14/2017 08:53 AM    Potassium 4.8 10/14/2017 08:53 AM    Chloride 101 10/14/2017 08:53 AM    CO2 23 10/14/2017 08:53 AM    Glucose 250 (H) 10/14/2017 08:53 AM    BUN 12 10/14/2017 08:53 AM    Creatinine 0.73 10/14/2017 08:53 AM     Lab Results   Component Value Date/Time    Cholesterol, total 160 04/05/2017 12:32 PM    HDL Cholesterol 61 04/05/2017 12:32 PM    LDL, calculated 77 04/05/2017 12:32 PM    Triglyceride 112 04/05/2017 12:32 PM     No results found for: GPT        Impression / Plan:        ICD-10-CM ICD-9-CM    1. Acute left-sided low back pain without sciatica M54.5 724.2 AMB POC URINALYSIS DIP STICK AUTO W/O MICRO      CBC WITH AUTOMATED DIFF      METABOLIC PANEL, COMPREHENSIVE      CULTURE, URINE      HYDROcodone-acetaminophen (NORCO) 5-325 mg per tablet      naloxone (NARCAN) 4 mg/actuation nasal spray    2. Right inguinal pain R10.31 789.03 CBC WITH AUTOMATED DIFF      METABOLIC PANEL, COMPREHENSIVE      cephALEXin (KEFLEX) 500 mg capsule   3. Posterior cervical lymphadenopathy R59.0 785.6 REFERRAL TO ENT-OTOLARYNGOLOGY   4. Left upper quadrant pain R10.12 789.02 LIPASE      HYDROcodone-acetaminophen (NORCO) 5-325 mg per tablet   5. Chronic right shoulder pain M25.511 719.41 HYDROcodone-acetaminophen (NORCO) 5-325 mg per tablet    G89.29 338.29 naloxone (NARCAN) 4 mg/actuation nasal spray     PMP reviewed.    Pt sees PSych for ADHD.      Explained to patient risk benefits of the medications.Advised patient to stop meds if having any side effects.Pt verbalized understanding of the instructions.    I have discussed the diagnosis with the patient and the intended plan as seen in the above orders.  The patient has received an after-visit summary and questions were answered concerning future plans.  I have discussed medication side effects and warnings with the patient as well. I have reviewed the  plan of care with the patient, accepted their input and they are in agreement with the treatment goals.     Reviewed plan of care. Patient has provided input and agrees with goals.    Follow-up and Dispositions    ?? Return in about 1 month (around 12/30/2017).         Jenene Slicker, MD

## 2017-11-30 NOTE — Progress Notes (Signed)
Chief Complaint   Patient presents with   ??? Hospital Follow Up     follow up, limb saving surgery and catherization     1. Have you been to the ER, urgent care clinic since your last visit?  Hospitalized since your last visit?Yes When: Nov 2019 Where: [facility] Reason for visit: heart catherization    2. Have you seen or consulted any other health care providers outside of the  Health System since your last visit?  Include any pap smears or colon screening. No

## 2017-12-02 LAB — CULTURE, URINE

## 2017-12-16 ENCOUNTER — Ambulatory Visit: Attending: Internal Medicine | Primary: Internal Medicine

## 2017-12-16 ENCOUNTER — Ambulatory Visit: Admit: 2017-12-16 | Payer: PRIVATE HEALTH INSURANCE | Attending: Internal Medicine | Primary: Internal Medicine

## 2017-12-16 DIAGNOSIS — R590 Localized enlarged lymph nodes: Secondary | ICD-10-CM

## 2017-12-16 MED ORDER — PERMETHRIN 5 % TOPICAL CREAM
5 % | CUTANEOUS | 1 refills | Status: DC
Start: 2017-12-16 — End: 2018-01-24

## 2017-12-16 MED ORDER — DIPHENHYDRAMINE 25 MG CAP
25 mg | ORAL_CAPSULE | Freq: Four times a day (QID) | ORAL | 1 refills | Status: AC | PRN
Start: 2017-12-16 — End: 2017-12-26

## 2017-12-16 MED ORDER — DIPHENHYDRAMINE HCL 50 MG/ML IJ SOLN
50 mg/mL | Freq: Once | INTRAMUSCULAR | 0 refills | Status: DC
Start: 2017-12-16 — End: 2017-12-16

## 2017-12-16 MED ORDER — TRIAMCINOLONE ACETONIDE 40 MG/ML SUSP FOR INJECTION
40 mg/mL | Freq: Once | INTRAMUSCULAR | 0 refills | Status: AC
Start: 2017-12-16 — End: 2017-12-16

## 2017-12-16 MED ORDER — MONTELUKAST 10 MG TAB
10 mg | ORAL_TABLET | Freq: Every day | ORAL | 1 refills | Status: AC
Start: 2017-12-16 — End: ?

## 2017-12-16 NOTE — Progress Notes (Signed)
Cathy [name] is a 52 y.o.  female and presents with     Chief Complaint   Patient presents with   ??? Allergic Reaction     whole body, UKO   ??? Skin Problem     Pt  Ihas been having itching all over her body.  Pt says she  went to [facility] last week and was given atarax and prednisone and benadryl.Pt took prednisone for a week or so.  Pt however has already  been prescribed Atarax by Psych for anxiety. Her meds were recently changed by Psych due to itching.  Pt saw Dermatology and they gave her cream for itching but that did not help.  Pt has lot of anxiety.  Pt had ultrasound neck that showed enarged lymph node.        Past Medical History:   Diagnosis Date   ??? Asthma    ??? Depression    ??? Diabetes (HCC)     Type 2 DM   ??? Hypercholesterolemia    ??? Hypertension      Past Surgical History:   Procedure Laterality Date   ??? CARDIAC SURG PROCEDURE UNLIST  2019    heart catherization    ??? HX ACL RECONSTRUCTION      right knee   ??? HX APPENDECTOMY     ??? HX ARTERIAL BYPASS      left main artery bypass   ??? HX HYSTERECTOMY     ??? HX WISDOM TEETH EXTRACTION       Current Outpatient Medications   Medication Sig   ??? diphenhydrAMINE (BENADRYL) 50 mg/mL injection 0.5 mL by IntraMUSCular route once for 1 dose.   ??? permethrin (ACTICIN) 5 % topical cream apply sparingly as directed at hs on 12/16/2017 and repeat treatment after one week   ??? triamcinolone acetonide (KENALOG) 40 mg/mL injection 1 mL by IntraMUSCular route once for 1 dose.   ??? montelukast (SINGULAIR) 10 mg tablet Take 1 Tab by mouth daily.   ??? diphenhydrAMINE (BENADRYL) 25 mg capsule Take 1 Cap by mouth every six (6) hours as needed (prn) for up to 10 days.   ??? naloxone (NARCAN) 4 mg/actuation nasal spray Use 1 spray intranasally, then discard. Repeat with new spray every 2 min as needed for opioid overdose symptoms, alternating nostrils.   ??? ammonium lactate (AMLACTIN) 12 % topical cream Apply  to affected area two (2) times a day. rub in to affected area well   ???  diphenhydrAMINE (BENADRYL ALLERGY) 25 mg tablet Take 1 Tab by mouth nightly as needed for Sleep.   ??? triamcinolone acetonide (KENALOG) 0.1 % topical cream Apply  to affected area two (2) times a day. use thin layer   ??? permethrin (ACTICIN) 5 % topical cream apply sparingly as directed   ??? lisinopril (PRINIVIL, ZESTRIL) 20 mg tablet Take 1 Tab by mouth daily.   ??? carisoprodol (SOMA) 350 mg tablet    ??? lidocaine (LIDODERM) 5 %    ??? insulin glargine (LANTUS SOLOSTAR U-100 INSULIN) 100 unit/mL (3 mL) inpn 50 Units by SubCUTAneous route nightly.   ??? insulin aspart U-100 (NOVOLOG FLEXPEN U-100 INSULIN) 100 unit/mL (3 mL) inpn adminitser 10 units subcut three times daily before each meal   ??? metoprolol tartrate (LOPRESSOR) 50 mg tablet Take 1 Tab by mouth two (2) times a day.   ??? aspirin delayed-release 81 mg tablet Take 1 Tab by mouth daily.   ??? diclofenac (VOLTAREN) 1 % gel Apply 4 g  to affected area four (4) times daily.   ??? dextroamphetamine-amphetamine (ADDERALL) 20 mg tablet Take 20 mg by mouth two (2) times a day.   ??? Blood-Glucose Meter (TRUE METRIX GLUCOSE METER) misc Use as directed   ??? albuterol (PROVENTIL HFA) 90 mcg/actuation inhaler Take 1 Puff by inhalation.   ??? albuterol (PROVENTIL VENTOLIN) 2.5 mg /3 mL (0.083 %) nebulizer solution Take 2.5 mg by inhalation.   ??? glucose blood VI test strips (TRUE METRIX GLUCOSE TEST STRIP) strip Use as instructed   ??? FLUoxetine (PROZAC) 20 mg capsule Take 20 mg by mouth daily.   ??? lancets (TRUEPLUS LANCETS) 28 gauge misc Use as directed   ??? rosuvastatin (CRESTOR) 10 mg tablet Take 10 mg by mouth.   ??? traZODone (DESYREL) 100 mg tablet Take 100 mg by mouth nightly as needed.     No current facility-administered medications for this visit.      Health Maintenance   Topic Date Due   ??? Hepatitis C Screening  03-05-1965   ??? FOOT EXAM Q1  09/13/1973   ??? EYE EXAM RETINAL OR DILATED  09/13/1973   ??? DTaP/Tdap/Td series (1 - Tdap) 09/14/1974   ??? Shingrix Vaccine Age 45> (1 of 2)  09/13/2013   ??? FOBT Q 1 YEAR AGE 34-75  09/13/2013   ??? Influenza Age 319 to Adult  03/24/2018 (Originally 08/12/2017)   ??? MICROALBUMIN Q1  04/06/2018   ??? LIPID PANEL Q1  04/06/2018   ??? HEMOGLOBIN A1C Q6M  04/15/2018   ??? PAP AKA CERVICAL CYTOLOGY  03/06/2019   ??? BREAST CANCER SCRN MAMMOGRAM  04/23/2019   ??? Pneumococcal 0-64 years  Completed     Immunization History   Administered Date(s) Administered   ??? Pneumococcal Polysaccharide (PPSV-23) 04/26/2017     No LMP recorded. Patient has had a hysterectomy.        Allergies and Intolerances:   Allergies   Allergen Reactions   ??? Pyridium [Phenazopyridine] Itching       Family History:   No family history on file.    Social History:   She  reports that she has been smoking. She has a 10.00 pack-year smoking history. She has quit using smokeless tobacco.  She  reports previous alcohol use.            Review of Systems: pos for itching  General: negative for - chills, fatigue, fever, weight change  Psych: negative for - anxiety, depression, irritability or mood swings  ENT: negative for - headaches, hearing change, nasal congestion, oral lesions, sneezing or sore throat  Heme/ Lymph: negative for - bleeding problems, bruising, pallor or swollen lymph nodes  Endo: negative for - hot flashes, polydipsia/polyuria or temperature intolerance  Resp: negative for - cough, shortness of breath or wheezing  CV: negative for - chest pain, edema or palpitations  GI: negative for - abdominal pain, change in bowel habits, constipation, diarrhea or nausea/vomiting  GU: negative for - dysuria, hematuria, incontinence, pelvic pain or vulvar/vaginal symptoms  MSK: negative for - joint pain, joint swelling or muscle pain  Neuro: negative for - confusion, headaches, seizures or weakness  Derm: negative for - dry skin, hair changes, rash or skin lesion changes          Physical:   Vitals:   Vitals:    12/16/17 1451   BP: 124/54   Pulse: 68   Resp: 16   Temp: 99 ??F (37.2 ??C)   TempSrc: Oral   SpO2:  100%   Weight: 189 lb 6.4 oz (85.9 kg)   Height: 5\' 8"  (1.727 m)           Exam:   HEENT- atraumatic,normocephalic, awake, oriented, well nourished  Neck - supple,no enlarged lymph nodes, no JVD, no thyromegaly  Chest- CTA, no rhonchi, no crackles  Heart- rrr, no murmurs / gallop/rub  Abdomen- soft,BS+,NT, no hepatosplenomegaly  Ext - no c/c/edema   Neuro- no focal deficits.Power 5/5 all extremities  Skin - warm,dry, no obvious rashes.          Review of Data:   LABS:   Lab Results   Component Value Date/Time    WBC 7.1 04/05/2017 12:32 PM    HGB 13.5 04/05/2017 12:32 PM    HCT 40.7 04/05/2017 12:32 PM    PLATELET 312 04/05/2017 12:32 PM     Lab Results   Component Value Date/Time    Sodium 141 10/14/2017 08:53 AM    Potassium 4.8 10/14/2017 08:53 AM    Chloride 101 10/14/2017 08:53 AM    CO2 23 10/14/2017 08:53 AM    Glucose 250 (H) 10/14/2017 08:53 AM    BUN 12 10/14/2017 08:53 AM    Creatinine 0.73 10/14/2017 08:53 AM     Lab Results   Component Value Date/Time    Cholesterol, total 160 04/05/2017 12:32 PM    HDL Cholesterol 61 04/05/2017 12:32 PM    LDL, calculated 77 04/05/2017 12:32 PM    Triglyceride 112 04/05/2017 12:32 PM     No results found for: GPT        Impression / Plan:        ICD-10-CM ICD-9-CM    1. Cervical lymphadenopathy R59.0 785.6 CT NECK SOFT TISSUE W WO CONT   2. Pruritus L29.9 698.9 diphenhydrAMINE (BENADRYL) 50 mg/mL injection      DIPHENHYDRAMINE HCL INJECTION <= 50 MG      PR THER/PROPH/DIAG INJECTION, SUBCUT/IM      permethrin (ACTICIN) 5 % topical cream      TRIAMCINOLONE ACETONIDE INJ      triamcinolone acetonide (KENALOG) 40 mg/mL injection      montelukast (SINGULAIR) 10 mg tablet      diphenhydrAMINE (BENADRYL) 25 mg capsule      REFERRAL TO ALLERGY     Etiology for pruritis - unknown- ? Scabies, psychological , meds, amxiety, r/o underlying lymphoproliferative disorder.Refer to Allergy specialist.      Explained to patient risk benefits of the medications.Advised patient to stop  meds if having any side effects.Pt verbalized understanding of the instructions.    I have discussed the diagnosis with the patient and the intended plan as seen in the above orders.  The patient has received an after-visit summary and questions were answered concerning future plans.  I have discussed medication side effects and warnings with the patient as well. I have reviewed the plan of care with the patient, accepted their input and they are in agreement with the treatment goals.     Reviewed plan of care. Patient has provided input and agrees with goals.    Follow-up and Dispositions    ?? Return in about 2 weeks (around 12/30/2017).         Jenene Slicker, MD

## 2017-12-16 NOTE — Progress Notes (Signed)
 Chief Complaint   Patient presents with   . Allergic Reaction     whole body, UKO       1. Have you been to the ER, urgent care clinic since your last visit?  Hospitalized since your last visit?Yes When: 11/19 Where: [facility] Reason for visit: Itching    2. Have you seen or consulted any other health care providers outside of the [facility] since your last visit?  Include any pap smears or colon screening. No

## 2017-12-16 NOTE — Progress Notes (Signed)
Chief Complaint   Patient presents with   ??? Allergic Reaction     whole body, UKO       1. Have you been to the ER, urgent care clinic since your last visit?  Hospitalized since your last visit?Yes When: 11/19 Where: [facility] ER Reason for visit: Itching    2. Have you seen or consulted any other health care providers outside of the [facility] since your last visit?  Include any pap smears or colon screening. No

## 2017-12-16 NOTE — Progress Notes (Signed)
Cathy [name] is a 52 y.o.  female and presents with     Chief Complaint   Patient presents with   ??? Allergic Reaction     whole body, UKO   ??? Skin Problem     Pt  Ihas been having itching all over her body.  Pt says she  went to [facility] last week and was given atarax and prednisone and benadryl.Pt took prednisone for a week or so.  Pt however has already  been prescribed Atarax by Psych for anxiety. Her meds were recently changed by Psych due to itching.  Pt saw Dermatology and they gave her cream for itching but that did not help.  Pt has lot of anxiety.  Pt had ultrasound neck that showed enarged lymph node.        Past Medical History:   Diagnosis Date   ??? Asthma    ??? Depression    ??? Diabetes (HCC)     Type 2 DM   ??? Hypercholesterolemia    ??? Hypertension      Past Surgical History:   Procedure Laterality Date   ??? CARDIAC SURG PROCEDURE UNLIST  2019    heart catherization    ??? HX ACL RECONSTRUCTION      right knee   ??? HX APPENDECTOMY     ??? HX ARTERIAL BYPASS      left main artery bypass   ??? HX HYSTERECTOMY     ??? HX WISDOM TEETH EXTRACTION       Current Outpatient Medications   Medication Sig   ??? diphenhydrAMINE (BENADRYL) 50 mg/mL injection 0.5 mL by IntraMUSCular route once for 1 dose.   ??? permethrin (ACTICIN) 5 % topical cream apply sparingly as directed at hs on 12/16/2017 and repeat treatment after one week   ??? triamcinolone acetonide (KENALOG) 40 mg/mL injection 1 mL by IntraMUSCular route once for 1 dose.   ??? montelukast (SINGULAIR) 10 mg tablet Take 1 Tab by mouth daily.   ??? diphenhydrAMINE (BENADRYL) 25 mg capsule Take 1 Cap by mouth every six (6) hours as needed (prn) for up to 10 days.   ??? naloxone (NARCAN) 4 mg/actuation nasal spray Use 1 spray intranasally, then discard. Repeat with new spray every 2 min as needed for opioid overdose symptoms, alternating nostrils.   ??? ammonium lactate (AMLACTIN) 12 % topical cream Apply  to affected area two (2) times a day. rub in to affected area well    ??? diphenhydrAMINE (BENADRYL ALLERGY) 25 mg tablet Take 1 Tab by mouth nightly as needed for Sleep.   ??? triamcinolone acetonide (KENALOG) 0.1 % topical cream Apply  to affected area two (2) times a day. use thin layer   ??? permethrin (ACTICIN) 5 % topical cream apply sparingly as directed   ??? lisinopril (PRINIVIL, ZESTRIL) 20 mg tablet Take 1 Tab by mouth daily.   ??? carisoprodol (SOMA) 350 mg tablet    ??? lidocaine (LIDODERM) 5 %    ??? insulin glargine (LANTUS SOLOSTAR U-100 INSULIN) 100 unit/mL (3 mL) inpn 50 Units by SubCUTAneous route nightly.   ??? insulin aspart U-100 (NOVOLOG FLEXPEN U-100 INSULIN) 100 unit/mL (3 mL) inpn adminitser 10 units subcut three times daily before each meal   ??? metoprolol tartrate (LOPRESSOR) 50 mg tablet Take 1 Tab by mouth two (2) times a day.   ??? aspirin delayed-release 81 mg tablet Take 1 Tab by mouth daily.   ??? diclofenac (VOLTAREN) 1 % gel Apply 4 g  to affected area four (4) times daily.   ??? dextroamphetamine-amphetamine (ADDERALL) 20 mg tablet Take 20 mg by mouth two (2) times a day.   ??? Blood-Glucose Meter (TRUE METRIX GLUCOSE METER) misc Use as directed   ??? albuterol (PROVENTIL HFA) 90 mcg/actuation inhaler Take 1 Puff by inhalation.   ??? albuterol (PROVENTIL VENTOLIN) 2.5 mg /3 mL (0.083 %) nebulizer solution Take 2.5 mg by inhalation.   ??? glucose blood VI test strips (TRUE METRIX GLUCOSE TEST STRIP) strip Use as instructed   ??? FLUoxetine (PROZAC) 20 mg capsule Take 20 mg by mouth daily.   ??? lancets (TRUEPLUS LANCETS) 28 gauge misc Use as directed   ??? rosuvastatin (CRESTOR) 10 mg tablet Take 10 mg by mouth.   ??? traZODone (DESYREL) 100 mg tablet Take 100 mg by mouth nightly as needed.     No current facility-administered medications for this visit.      Health Maintenance   Topic Date Due   ??? Hepatitis C Screening  01/29/1965   ??? FOOT EXAM Q1  09/13/1973   ??? EYE EXAM RETINAL OR DILATED  09/13/1973   ??? DTaP/Tdap/Td series (1 - Tdap) 09/14/1974    ??? Shingrix Vaccine Age 15> (1 of 2) 09/13/2013   ??? FOBT Q 1 YEAR AGE 43-75  09/13/2013   ??? Influenza Age 32 to Adult  03/24/2018 (Originally 08/12/2017)   ??? MICROALBUMIN Q1  04/06/2018   ??? LIPID PANEL Q1  04/06/2018   ??? HEMOGLOBIN A1C Q6M  04/15/2018   ??? PAP AKA CERVICAL CYTOLOGY  03/06/2019   ??? BREAST CANCER SCRN MAMMOGRAM  04/23/2019   ??? Pneumococcal 0-64 years  Completed     Immunization History   Administered Date(s) Administered   ??? Pneumococcal Polysaccharide (PPSV-23) 04/26/2017     No LMP recorded. Patient has had a hysterectomy.        Allergies and Intolerances:   Allergies   Allergen Reactions   ??? Pyridium [Phenazopyridine] Itching       Family History:   No family history on file.    Social History:   She  reports that she has been smoking. She has a 10.00 pack-year smoking history. She has quit using smokeless tobacco.  She  reports previous alcohol use.            Review of Systems: pos for itching  General: negative for - chills, fatigue, fever, weight change  Psych: negative for - anxiety, depression, irritability or mood swings  ENT: negative for - headaches, hearing change, nasal congestion, oral lesions, sneezing or sore throat  Heme/ Lymph: negative for - bleeding problems, bruising, pallor or swollen lymph nodes  Endo: negative for - hot flashes, polydipsia/polyuria or temperature intolerance  Resp: negative for - cough, shortness of breath or wheezing  CV: negative for - chest pain, edema or palpitations  GI: negative for - abdominal pain, change in bowel habits, constipation, diarrhea or nausea/vomiting  GU: negative for - dysuria, hematuria, incontinence, pelvic pain or vulvar/vaginal symptoms  MSK: negative for - joint pain, joint swelling or muscle pain  Neuro: negative for - confusion, headaches, seizures or weakness  Derm: negative for - dry skin, hair changes, rash or skin lesion changes          Physical:   Vitals:   Vitals:    12/16/17 1451   BP: 124/54   Pulse: 68   Resp: 16    Temp: 99 ??F (37.2 ??C)   TempSrc: Oral   SpO2:  100%   Weight: 189 lb 6.4 oz (85.9 kg)   Height: 5\' 8"  (1.727 m)           Exam:   HEENT- atraumatic,normocephalic, awake, oriented, well nourished  Neck - supple,no enlarged lymph nodes, no JVD, no thyromegaly  Chest- CTA, no rhonchi, no crackles  Heart- rrr, no murmurs / gallop/rub  Abdomen- soft,BS+,NT, no hepatosplenomegaly  Ext - no c/c/edema   Neuro- no focal deficits.Power 5/5 all extremities  Skin - warm,dry, no obvious rashes.          Review of Data:   LABS:   Lab Results   Component Value Date/Time    WBC 7.1 04/05/2017 12:32 PM    HGB 13.5 04/05/2017 12:32 PM    HCT 40.7 04/05/2017 12:32 PM    PLATELET 312 04/05/2017 12:32 PM     Lab Results   Component Value Date/Time    Sodium 141 10/14/2017 08:53 AM    Potassium 4.8 10/14/2017 08:53 AM    Chloride 101 10/14/2017 08:53 AM    CO2 23 10/14/2017 08:53 AM    Glucose 250 (H) 10/14/2017 08:53 AM    BUN 12 10/14/2017 08:53 AM    Creatinine 0.73 10/14/2017 08:53 AM     Lab Results   Component Value Date/Time    Cholesterol, total 160 04/05/2017 12:32 PM    HDL Cholesterol 61 04/05/2017 12:32 PM    LDL, calculated 77 04/05/2017 12:32 PM    Triglyceride 112 04/05/2017 12:32 PM     No results found for: GPT        Impression / Plan:        ICD-10-CM ICD-9-CM    1. Cervical lymphadenopathy R59.0 785.6 CT NECK SOFT TISSUE W WO CONT   2. Pruritus L29.9 698.9 diphenhydrAMINE (BENADRYL) 50 mg/mL injection      DIPHENHYDRAMINE HCL INJECTION <= 50 MG      PR THER/PROPH/DIAG INJECTION, SUBCUT/IM      permethrin (ACTICIN) 5 % topical cream      TRIAMCINOLONE ACETONIDE INJ      triamcinolone acetonide (KENALOG) 40 mg/mL injection      montelukast (SINGULAIR) 10 mg tablet      diphenhydrAMINE (BENADRYL) 25 mg capsule      REFERRAL TO ALLERGY     Etiology for pruritis - unknown- ? Scabies, psychological , meds, amxiety, r/o underlying lymphoproliferative disorder.Refer to Allergy specialist.       Explained to patient risk benefits of the medications.Advised patient to stop meds if having any side effects.Pt verbalized understanding of the instructions.    I have discussed the diagnosis with the patient and the intended plan as seen in the above orders.  The patient has received an after-visit summary and questions were answered concerning future plans.  I have discussed medication side effects and warnings with the patient as well. I have reviewed the plan of care with the patient, accepted their input and they are in agreement with the treatment goals.     Reviewed plan of care. Patient has provided input and agrees with goals.    Follow-up and Dispositions    ?? Return in about 2 weeks (around 12/30/2017).         Jenene SlickerPravin M Kataleyah Carducci, MD

## 2017-12-23 NOTE — Telephone Encounter (Signed)
LVM informing pt that CT scan is scheduled for 12/28/17 @ 8am. If she has any additional questions regarding this, she needs to call central scheduling @ [phone].

## 2017-12-23 NOTE — Telephone Encounter (Signed)
-----   Message from Frances FurbishVictoria N Hudson sent at 12/23/2017 11:21 AM EST -----  Regarding: Dr. Deshmukh/ telephone  Contact: [phone]  Caller's first and last name: Pt  Reason for call: Pt is looking to get information on the date and time of her CT Scan.  Callback required yes/no and why: yes  Best contact number(s): [phone]  Details to clarify the request: n/a

## 2017-12-24 NOTE — Telephone Encounter (Signed)
[employer] [place] [employer] called , sated that her insurance will not cover st to be done in the [place] location. [facility]. Call back number is [phone]

## 2017-12-24 NOTE — Telephone Encounter (Signed)
LVM informing pt that if order is already in the chart, [facility] would be able to see the order. Central scheduling just would need to call the patient and [facility].

## 2017-12-27 NOTE — Telephone Encounter (Signed)
[facility] called stating that the patient was the denied CT scan.  Scheduled for tomorrow 12/17    [facility]     [phone]  Case # [id number]

## 2017-12-28 ENCOUNTER — Ambulatory Visit: Payer: MEDICAID | Primary: Internal Medicine

## 2018-01-04 NOTE — Telephone Encounter (Signed)
Please advise

## 2018-01-06 MED ORDER — METOPROLOL TARTRATE 50 MG TAB
50 mg | ORAL_TABLET | ORAL | 2 refills | Status: DC
Start: 2018-01-06 — End: 2019-04-11

## 2018-01-11 MED ORDER — BLOOD-GLUCOSE METER
0 refills | Status: DC
Start: 2018-01-11 — End: 2018-01-13

## 2018-01-11 NOTE — Telephone Encounter (Signed)
Meter sent as requested.

## 2018-01-11 NOTE — Telephone Encounter (Signed)
Needs a new meter reader sent to the pharmacy, she can not find hers

## 2018-01-13 ENCOUNTER — Encounter

## 2018-01-13 MED ORDER — BLOOD GLUCOSE METER KIT
PACK | 0 refills | Status: AC
Start: 2018-01-13 — End: ?

## 2018-01-13 NOTE — Telephone Encounter (Signed)
New meter sent as requested.

## 2018-01-13 NOTE — Telephone Encounter (Signed)
Pharmacy called, they need [place] to change the prescription from True Metrix Glucose Meter to OneTouch Glucose meter

## 2018-01-14 ENCOUNTER — Inpatient Hospital Stay
Admit: 2018-01-14 | Discharge: 2018-01-14 | Disposition: A | Discharge status: Home / Self Care | Payer: MEDICAID | Attending: Emergency Medicine

## 2018-01-14 DIAGNOSIS — E1165 Type 2 diabetes mellitus with hyperglycemia: Secondary | ICD-10-CM

## 2018-01-14 LAB — CBC WITH AUTO DIFFERENTIAL
Basophils %: 1 % (ref 0–1)
Basophils Absolute: 0 10*3/uL (ref 0.0–0.1)
Eosinophils %: 2 % (ref 0–7)
Eosinophils Absolute: 0.1 10*3/uL (ref 0.0–0.4)
Granulocyte Absolute Count: 0 10*3/uL (ref 0.00–0.04)
Hematocrit: 43.5 % (ref 35.0–47.0)
Hemoglobin: 14.9 g/dL (ref 11.5–16.0)
Immature Granulocytes: 0 % (ref 0.0–0.5)
Lymphocytes %: 48 % (ref 12–49)
Lymphocytes Absolute: 2.2 10*3/uL (ref 0.8–3.5)
MCH: 29.4 PG (ref 26.0–34.0)
MCHC: 34.3 g/dL (ref 30.0–36.5)
MCV: 85.8 FL (ref 80.0–99.0)
MPV: 9.6 FL (ref 8.9–12.9)
Monocytes %: 7 % (ref 5–13)
Monocytes Absolute: 0.3 10*3/uL (ref 0.0–1.0)
NRBC Absolute: 0 10*3/uL (ref 0.00–0.01)
Neutrophils %: 42 % (ref 32–75)
Neutrophils Absolute: 1.9 10*3/uL (ref 1.8–8.0)
Nucleated RBCs: 0 PER 100 WBC
Platelets: 239 10*3/uL (ref 150–400)
RBC: 5.07 M/uL (ref 3.80–5.20)
RDW: 13 % (ref 11.5–14.5)
WBC: 4.5 10*3/uL (ref 3.6–11.0)

## 2018-01-14 LAB — POC VENOUS BLOOD GAS
BASE DEFICIT, VENOUS (POC): 1 mmol/L
Base deficit, venous (POC): 1 mmol/L
FIO2 (POC): 0.21 %
FIO2: 0.21 %
HCO3 ISTAT, VENOUS,HCO3IV: 23.5 MMOL/L (ref 23.0–28.0)
HCO3, venous (POC): 23.5 MMOL/L (ref 23.0–28.0)
PCO2 ISTAT, VENOUS,PCO2IV: 38.9 MMHG — ABNORMAL LOW (ref 41–51)
PH, VENOUS (POC): 7.389 (ref 7.32–7.42)
PO2, VENOUS (POC): 41 mmHg — ABNORMAL HIGH (ref 25–40)
SO2, VENOUS (POC): 76 % (ref 65–88)
pCO2, venous (POC): 38.9 MMHG — ABNORMAL LOW (ref 41–51)
pH, venous (POC): 7.389 (ref 7.32–7.42)
pO2, venous (POC): 41 mmHg — ABNORMAL HIGH (ref 25–40)
sO2, venous (POC): 76 % (ref 65–88)

## 2018-01-14 LAB — URINALYSIS W/ REFLEX CULTURE
BACTERIA, URINE: NEGATIVE /hpf
Bacteria: NEGATIVE /hpf
Bilirubin, Urine: NEGATIVE
Bilirubin: NEGATIVE
Blood, Urine: NEGATIVE
Blood: NEGATIVE
Glucose, Ur: >1000 mg/dL — AB
Glucose: >1000 mg/dL — AB
Ketone: NEGATIVE mg/dL
Ketones, Urine: NEGATIVE mg/dL
Leukocyte Esterase, Urine: NEGATIVE
Leukocyte Esterase: NEGATIVE
Nitrite, Urine: NEGATIVE
Nitrites: NEGATIVE
Protein, UA: NEGATIVE mg/dL
Protein: NEGATIVE mg/dL
Specific Gravity, UA: 1.01
Specific gravity: 1.01
Urobilinogen, UA, POCT: 0.2 EU/dL (ref 0.2–1.0)
Urobilinogen: 0.2 EU/dL (ref 0.2–1.0)
pH (UA): 5.5 (ref 5.0–8.0)
pH, UA: 5.5 (ref 5.0–8.0)

## 2018-01-14 LAB — COMPREHENSIVE METABOLIC PANEL
ALT: 26 U/L (ref 12–78)
AST: 10 U/L — ABNORMAL LOW (ref 15–37)
Albumin/Globulin Ratio: 1.1 (ref 1.1–2.2)
Albumin: 3.7 g/dL (ref 3.5–5.0)
Alkaline Phosphatase: 107 U/L (ref 45–117)
Anion Gap: 5 mmol/L (ref 5–15)
BUN: 13 MG/DL (ref 6–20)
Bun/Cre Ratio: 15 (ref 12–20)
CO2: 25 mmol/L (ref 21–32)
Calcium: 9 MG/DL (ref 8.5–10.1)
Chloride: 107 mmol/L (ref 97–108)
Creatinine: 0.86 MG/DL (ref 0.55–1.02)
EGFR IF NonAfrican American: >60 mL/min/{1.73_m2} (ref >60)
GFR African American: >60 mL/min/{1.73_m2} (ref >60)
Globulin: 3.4 g/dL (ref 2.0–4.0)
Glucose: 309 mg/dL — ABNORMAL HIGH (ref 65–100)
Potassium: 3.8 mmol/L (ref 3.5–5.1)
Sodium: 137 mmol/L (ref 136–145)
Total Bilirubin: 0.5 MG/DL (ref 0.2–1.0)
Total Protein: 7.1 g/dL (ref 6.4–8.2)

## 2018-01-14 LAB — POCT GLUCOSE
POC Glucose: 287 mg/dL — ABNORMAL HIGH (ref 65–100)
POC Glucose: 257 mg/dL — ABNORMAL HIGH (ref 65–100)

## 2018-01-14 LAB — LIPASE
Lipase: 164 U/L (ref 73–393)
Lipase: 164 U/L (ref 73–393)

## 2018-01-14 LAB — GLUCOSE, POC
Glucose (POC): 287 mg/dL — ABNORMAL HIGH (ref 65–100)
Glucose (POC): 257 mg/dL — ABNORMAL HIGH (ref 65–100)

## 2018-01-14 LAB — CBC WITH AUTOMATED DIFF
ABS. BASOPHILS: 0 10*3/uL (ref 0.0–0.1)
ABS. EOSINOPHILS: 0.1 10*3/uL (ref 0.0–0.4)
ABS. IMM. GRANS.: 0 10*3/uL (ref 0.00–0.04)
ABS. LYMPHOCYTES: 2.2 10*3/uL (ref 0.8–3.5)
ABS. MONOCYTES: 0.3 10*3/uL (ref 0.0–1.0)
ABS. NEUTROPHILS: 1.9 10*3/uL (ref 1.8–8.0)
ABSOLUTE NRBC: 0 10*3/uL (ref 0.00–0.01)
BASOPHILS: 1 % (ref 0–1)
EOSINOPHILS: 2 % (ref 0–7)
HCT: 43.5 % (ref 35.0–47.0)
HGB: 14.9 g/dL (ref 11.5–16.0)
IMMATURE GRANULOCYTES: 0 % (ref 0.0–0.5)
LYMPHOCYTES: 48 % (ref 12–49)
MCH: 29.4 PG (ref 26.0–34.0)
MCHC: 34.3 g/dL (ref 30.0–36.5)
MCV: 85.8 FL (ref 80.0–99.0)
MONOCYTES: 7 % (ref 5–13)
MPV: 9.6 FL (ref 8.9–12.9)
NEUTROPHILS: 42 % (ref 32–75)
NRBC: 0 PER 100 WBC
PLATELET: 239 10*3/uL (ref 150–400)
RBC: 5.07 M/uL (ref 3.80–5.20)
RDW: 13 % (ref 11.5–14.5)
WBC: 4.5 10*3/uL (ref 3.6–11.0)

## 2018-01-14 LAB — METABOLIC PANEL, COMPREHENSIVE
A-G Ratio: 1.1 (ref 1.1–2.2)
ALT (SGPT): 26 U/L (ref 12–78)
AST (SGOT): 10 U/L — ABNORMAL LOW (ref 15–37)
Albumin: 3.7 g/dL (ref 3.5–5.0)
Alk. phosphatase: 107 U/L (ref 45–117)
Anion gap: 5 mmol/L (ref 5–15)
BUN/Creatinine ratio: 15 (ref 12–20)
BUN: 13 MG/DL (ref 6–20)
Bilirubin, total: 0.5 MG/DL (ref 0.2–1.0)
CO2: 25 mmol/L (ref 21–32)
Calcium: 9 MG/DL (ref 8.5–10.1)
Chloride: 107 mmol/L (ref 97–108)
Creatinine: 0.86 MG/DL (ref 0.55–1.02)
GFR est AA: >60 mL/min/{1.73_m2} (ref >60)
GFR est non-AA: >60 mL/min/{1.73_m2} (ref >60)
Globulin: 3.4 g/dL (ref 2.0–4.0)
Glucose: 309 mg/dL — ABNORMAL HIGH (ref 65–100)
Potassium: 3.8 mmol/L (ref 3.5–5.1)
Protein, total: 7.1 g/dL (ref 6.4–8.2)
Sodium: 137 mmol/L (ref 136–145)

## 2018-01-14 LAB — BETA-HYDROXYBUTYRATE: B-hydroxybutyrate: 0.11 mmol/L (ref <0.40)

## 2018-01-14 LAB — SAMPLES BEING HELD: SAMPLES BEING HELD: 1

## 2018-01-14 MED ORDER — SODIUM CHLORIDE 0.9% BOLUS IV
0.9 % | INTRAVENOUS | Status: AC
Start: 2018-01-14 — End: 2018-01-14
  Administered 2018-01-14: 17:00:00 via INTRAVENOUS

## 2018-01-14 MED FILL — SODIUM CHLORIDE 0.9 % IV: INTRAVENOUS | Qty: 1000

## 2018-01-14 NOTE — ED Notes (Signed)
PA reviewed discharge instructions with pt. VSS. Pt ambulatory out of unit with spouse.

## 2018-01-14 NOTE — ED Notes (Signed)
Triage note: Pt arrives with c/o frequent urination and thirst x 2 weeks. Pt BG this morning >600. Pt reports nausea.    Pt took her insulin this morning. BG now 289.

## 2018-01-14 NOTE — ED Provider Notes (Signed)
ED Provider Notes by Marylee FlorasMorin, Yamin Swingler A, PA at 01/14/18 1145                Author: Marylee FlorasMorin, Lawsen Arnott A, PA  Service: Emergency Medicine  Author Type: Physician Assistant       Filed: 01/14/18 1320  Date of Service: 01/14/18 1145  Status: Attested Addendum          Editor: Marylee FlorasMorin, Perla Echavarria A, PA (Physician Assistant)       Related Notes: Original Note by Marylee FlorasMorin, Donivan Thammavong A, PA (Physician Assistant) filed at 01/14/18 1317          Cosigner: Lemmie EvensJohnson, Christopher P, MD at 01/14/18 1519          Attestation signed by Lemmie EvensJohnson, Christopher P, MD at 01/14/18 1519          I was personally available for consultation in the emergency department.  I have reviewed the chart and agree with the documentation [facility], including  the assessment, treatment plan, and disposition.   Lemmie Evenshristopher P Johnson, MD                                    53 y/o AA female presents to the ED for the evaluation of hyperglycemia.  According to patient yesterday morning  her blood sugar was >600.  Today when she woke up it was 459.  She used 15 U of her Novolog and on arrival her blood sugar was 287.  She states she has felt increased thirst and increased urinary frequency for the past 2 weeks.  She admits to not being  on top of her insulin administration over the holidays.  She has had some nausea but denies any cp, sob or doe.  States the only time she was admitted for her diabetes was when she was diagnosed around 5-6 years ago.  Patient otherwise asymptomatic.   Tried calling PCP.   They could not get her in so they recommended she come to the ER to be evaluated.  No other acute medical complaints expressed at this time.                    Past Medical History:        Diagnosis  Date         ?  Asthma       ?  Depression       ?  Diabetes (HCC)            Type 2 DM         ?  Hypercholesterolemia           ?  Hypertension               Past Surgical History:         Procedure  Laterality  Date          ?  CARDIAC SURG PROCEDURE UNLIST    2019           heart catherization           ?  HX ACL RECONSTRUCTION              right knee          ?  HX APPENDECTOMY         ?  HX ARTERIAL BYPASS              left  main artery bypass          ?  HX HYSTERECTOMY              ?  HX WISDOM TEETH EXTRACTION                 No family history on file.        Social History          Socioeconomic History         ?  Marital status:  SINGLE              Spouse name:  Not on file         ?  Number of children:  Not on file     ?  Years of education:  Not on file     ?  Highest education level:  Not on file       Occupational History        ?  Not on file       Social Needs         ?  Financial resource strain:  Not on file        ?  Food insecurity:              Worry:  Not on file         Inability:  Not on file        ?  Transportation needs:              Medical:  Not on file         Non-medical:  Not on file       Tobacco Use         ?  Smoking status:  Current Every Day Smoker              Packs/day:  0.25         Years:  40.00         Pack years:  10.00         ?  Smokeless tobacco:  Former Neurosurgeon        ?  Tobacco comment: 4 cigs a day       Substance and Sexual Activity         ?  Alcohol use:  Not Currently     ?  Drug use:  Never     ?  Sexual activity:  Not on file       Lifestyle        ?  Physical activity:              Days per week:  Not on file         Minutes per session:  Not on file         ?  Stress:  Not on file       Relationships        ?  Social connections:              Talks on phone:  Not on file         Gets together:  Not on file         Attends religious service:  Not on file         Active member of club or organization:  Not on file         Attends meetings of clubs or organizations:  Not on file  Relationship status:  Not on file        ?  Intimate partner violence:              Fear of current or ex partner:  Not on file         Emotionally abused:  Not on file         Physically abused:  Not on file         Forced sexual activity:  Not on  file        Other Topics  Concern        ?  Not on file       Social History Narrative        ?  Not on file              ALLERGIES: Pyridium [phenazopyridine]      Review of Systems    Constitutional: Negative for chills and fever.    Gastrointestinal: Positive for nausea. Negative for abdominal pain and vomiting.    Genitourinary: Positive for frequency.    All other systems reviewed and are negative.           Vitals:          01/14/18 1101        BP:  110/69     Pulse:  99     Resp:  16     Temp:  98.7 ??F (37.1 ??C)     SpO2:  97%     Weight:  85.7 kg (189 lb)        Height:  5\' 8"  (1.727 m)                Physical Exam   Vitals signs and nursing note reviewed.   Constitutional:        General: She is not in acute distress.     Appearance: She is well-developed.    HENT:       Head: Normocephalic and atraumatic.   Eyes :       Conjunctiva/sclera: Conjunctivae normal.      Pupils: Pupils are equal, round, and reactive to light.    Neck:       Musculoskeletal: Normal range of motion and neck supple.   Cardiovascular :       Rate and Rhythm: Normal rate and regular rhythm.      Heart sounds: Normal heart sounds. No murmur.    Pulmonary:       Effort: Pulmonary effort is normal. No respiratory distress.      Breath sounds: Normal breath sounds. No stridor. No wheezing or  rhonchi.    Abdominal:      General: Bowel sounds are normal. There is no distension.      Palpations: Abdomen is soft. There is no mass.      Tenderness: There is no tenderness. There is no guarding  or rebound.     Musculoskeletal: Normal range of motion.    Skin:      General: Skin is warm and dry.      Findings: No rash.    Neurological:       Mental Status: She is alert and oriented to person, place, and time.      Deep Tendon Reflexes: Reflexes are normal and symmetric.    Psychiatric:         Behavior: Behavior normal.              MDM   Number of  Diagnoses or Management Options   Hyperglycemia:    Diagnosis management comments: 53 y/o AA  female here for hyperglycemia   Will check labs, ekg, give IVF and then re-assess   11:56 AM      ED EKG interpretation:   Rhythm: normal sinus rhythm; and sinus arrythmia. Rate (approx.): 64; Axis: normal; P wave: normal; QRS interval: normal ; ST/T wave: normal; in    EKG documented by Marylee FlorasJuan A Sheila Gervasi, PA, as interpreted by Lemmie EvensJohnson, Christopher P, *, ED MD.         Reviewed lab findings with patient.  Blood sugar improving.  Hyperglycemia likely 2/2 patient non-compliance   Recommend stricter blood sugar control.  F/u with PCP without fail    Patient verbalizes understanding of dx and is aware of what s/sx to monitor that would warrant return visit to ED   Discussed with Dr. Laural BenesJohnson          Amount and/or Complexity of Data Reviewed   Clinical lab tests: ordered and reviewed    Tests in the medicine section of CPT??: ordered and reviewed      Risk of Complications, Morbidity, and/or Mortality   Presenting problems: high  Diagnostic procedures: high  Management options: high     Patient Progress   Patient progress: stable             Procedures

## 2018-01-14 NOTE — Telephone Encounter (Signed)
Patients glucose reading on Wednesday was over 600, yesterday when she took it it was 300 and this morning it was 400. Needs to know what to do

## 2018-01-14 NOTE — ED Notes (Signed)
PA reviewed discharge instructions with pt. VSS. Pt ambulatory out of unit with spouse.

## 2018-01-14 NOTE — ED Triage Notes (Addendum)
Triage note: Pt arrives with c/o frequent urination and thirst x 2 weeks. Pt BG this morning >600. Pt reports nausea.    Pt took her insulin this morning. BG now 289.

## 2018-01-14 NOTE — Telephone Encounter (Signed)
-----   Message from [facility] sent at 01/11/2018  4:07 PM EST -----  Regarding: Dr. Desmukh/ Telephone  Contact: [phone]  General Message/Vendor Calls    Caller's first and last name: Cathy [name]       Reason for call: She stated the wrong glucose meter was sent to the pharmacy. She stated she needs the One Touch Ultra Meter. She stated the pharmacy has sent over a new request with no response. She is currently at the pharmacy waiting to get her meter.      Callback required yes/no and why: Yes, to verify the correct meter has been sent to the pharmacy.      Best contact number(s): [phone]      [facility]

## 2018-01-14 NOTE — ED Provider Notes (Addendum)
53 y/o AA female presents to the ED for the evaluation of hyperglycemia.  According to patient yesterday morning her blood sugar was >600.  Today when she woke up it was 459.  She used 15 U of her Novolog and on arrival her blood sugar was 287.  She states she has felt increased thirst and increased urinary frequency for the past 2 weeks.  She admits to not being on top of her insulin administration over the holidays.  She has had some nausea but denies any cp, sob or doe.  States the only time she was admitted for her diabetes was when she was diagnosed around 5-6 years ago.  Patient otherwise asymptomatic.  Tried calling PCP.   They could not get her in so they recommended she come to the ER to be evaluated.  No other acute medical complaints expressed at this time.             Past Medical History:   Diagnosis Date   ??? Asthma    ??? Depression    ??? Diabetes (HCC)     Type 2 DM   ??? Hypercholesterolemia    ??? Hypertension        Past Surgical History:   Procedure Laterality Date   ??? CARDIAC SURG PROCEDURE UNLIST  2019    heart catherization    ??? HX ACL RECONSTRUCTION      right knee   ??? HX APPENDECTOMY     ??? HX ARTERIAL BYPASS      left main artery bypass   ??? HX HYSTERECTOMY     ??? HX WISDOM TEETH EXTRACTION           No family history on file.    Social History     Socioeconomic History   ??? Marital status: SINGLE     Spouse name: Not on file   ??? Number of children: Not on file   ??? Years of education: Not on file   ??? Highest education level: Not on file   Occupational History   ??? Not on file   Social Needs   ??? Financial resource strain: Not on file   ??? Food insecurity:     Worry: Not on file     Inability: Not on file   ??? Transportation needs:     Medical: Not on file     Non-medical: Not on file   Tobacco Use   ??? Smoking status: Current Every Day Smoker     Packs/day: 0.25     Years: 40.00     Pack years: 10.00   ??? Smokeless tobacco: Former NeurosurgeonUser   ??? Tobacco comment: 4 cigs a day   Substance and Sexual Activity    ??? Alcohol use: Not Currently   ??? Drug use: Never   ??? Sexual activity: Not on file   Lifestyle   ??? Physical activity:     Days per week: Not on file     Minutes per session: Not on file   ??? Stress: Not on file   Relationships   ??? Social connections:     Talks on phone: Not on file     Gets together: Not on file     Attends religious service: Not on file     Active member of club or organization: Not on file     Attends meetings of clubs or organizations: Not on file     Relationship status: Not on file   ??? Intimate  partner violence:     Fear of current or ex partner: Not on file     Emotionally abused: Not on file     Physically abused: Not on file     Forced sexual activity: Not on file   Other Topics Concern   ??? Not on file   Social History Narrative   ??? Not on file         ALLERGIES: Pyridium [phenazopyridine]    Review of Systems   Constitutional: Negative for chills and fever.   Gastrointestinal: Positive for nausea. Negative for abdominal pain and vomiting.   Genitourinary: Positive for frequency.   All other systems reviewed and are negative.      Vitals:    01/14/18 1101   BP: 110/69   Pulse: 99   Resp: 16   Temp: 98.7 ??F (37.1 ??C)   SpO2: 97%   Weight: 85.7 kg (189 lb)   Height: 5\' 8"  (1.727 m)            Physical Exam  Vitals signs and nursing note reviewed.   Constitutional:       General: She is not in acute distress.     Appearance: She is well-developed.   HENT:      Head: Normocephalic and atraumatic.   Eyes:      Conjunctiva/sclera: Conjunctivae normal.      Pupils: Pupils are equal, round, and reactive to light.   Neck:      Musculoskeletal: Normal range of motion and neck supple.   Cardiovascular:      Rate and Rhythm: Normal rate and regular rhythm.      Heart sounds: Normal heart sounds. No murmur.   Pulmonary:      Effort: Pulmonary effort is normal. No respiratory distress.      Breath sounds: Normal breath sounds. No stridor. No wheezing or rhonchi.   Abdominal:       General: Bowel sounds are normal. There is no distension.      Palpations: Abdomen is soft. There is no mass.      Tenderness: There is no tenderness. There is no guarding or rebound.   Musculoskeletal: Normal range of motion.   Skin:     General: Skin is warm and dry.      Findings: No rash.   Neurological:      Mental Status: She is alert and oriented to person, place, and time.      Deep Tendon Reflexes: Reflexes are normal and symmetric.   Psychiatric:         Behavior: Behavior normal.          MDM  Number of Diagnoses or Management Options  Hyperglycemia:   Diagnosis management comments: 53 y/o AA female here for hyperglycemia  Will check labs, ekg, give IVF and then re-assess  11:56 AM    ED EKG interpretation:  Rhythm: normal sinus rhythm; and sinus arrythmia. Rate (approx.): 64; Axis: normal; P wave: normal; QRS interval: normal ; ST/T wave: normal; in   EKG documented by Marylee Floras, PA, as interpreted by Lemmie Evens, *, ED MD.      Reviewed lab findings with patient.  Blood sugar improving.  Hyperglycemia likely 2/2 patient non-compliance  Recommend stricter blood sugar control.  F/u with PCP without fail   Patient verbalizes understanding of dx and is aware of what s/sx to monitor that would warrant return visit to ED  Discussed with Dr. Laural Benes  Amount and/or Complexity of Data Reviewed  Clinical lab tests: ordered and reviewed  Tests in the medicine section of CPT??: ordered and reviewed    Risk of Complications, Morbidity, and/or Mortality  Presenting problems: high  Diagnostic procedures: high  Management options: high    Patient Progress  Patient progress: stable         Procedures

## 2018-01-14 NOTE — Telephone Encounter (Signed)
Meter was sent to the pharmacy yesterday evening @4 :34. Pt needs to check with the pharmacy.

## 2018-01-14 NOTE — Telephone Encounter (Signed)
PA started with CoverMyMeds for OneTouch Ultra system.       Key #[id number]

## 2018-01-15 LAB — EKG 12-LEAD
Atrial Rate: 59 {beats}/min
P Axis: 49 degrees
P-R Interval: 144 ms
Q-T Interval: 422 ms
QRS Duration: 88 ms
QTc Calculation (Bazett): 417 ms
R Axis: 28 degrees
T Axis: 69 degrees
Ventricular Rate: 59 {beats}/min

## 2018-01-15 LAB — EKG, 12 LEAD, INITIAL
Atrial Rate: 59 {beats}/min
Calculated P Axis: 49 degrees
Calculated R Axis: 28 degrees
Calculated T Axis: 69 degrees
P-R Interval: 144 ms
Q-T Interval: 422 ms
QRS Duration: 88 ms
QTC Calculation (Bezet): 417 ms
Ventricular Rate: 59 {beats}/min

## 2018-01-16 NOTE — Telephone Encounter (Signed)
Pt was seen in the ER. P tcan come for folow up appt next week. I think pt also sees Endocrinology.Pt can also follow up with Endocrinology.

## 2018-01-17 NOTE — Telephone Encounter (Signed)
Pt has follow up appt scheduled for Thursday Jan 9th at 215pm.

## 2018-01-17 NOTE — Telephone Encounter (Signed)
LVM for pt to advise pt that Dr. Danella Penton does not fill out this type of paperwork.

## 2018-01-17 NOTE — Telephone Encounter (Signed)
Pts insurance has denied rx for meter.Pt states she one but its is not in working condition. Please advise

## 2018-01-17 NOTE — Telephone Encounter (Signed)
I received Disability assessment forms from [employer]  I do not do disability assessment. Pt needs to take it to her specialist.

## 2018-01-20 ENCOUNTER — Encounter: Attending: Internal Medicine | Primary: Internal Medicine

## 2018-01-20 NOTE — Telephone Encounter (Signed)
Can we check with pts pharmacy and find out what meter is covered under pts plan.

## 2018-01-20 NOTE — Telephone Encounter (Signed)
Advised pt she may have to pay out of pocket for new meter due to having one already. LVM for pt if need be she is to call office with any further questions or concerns.

## 2018-01-24 ENCOUNTER — Ambulatory Visit: Attending: Internal Medicine | Primary: Internal Medicine

## 2018-01-24 ENCOUNTER — Ambulatory Visit
Admit: 2018-01-24 | Discharge: 2018-01-24 | Payer: PRIVATE HEALTH INSURANCE | Attending: Internal Medicine | Primary: Internal Medicine

## 2018-01-24 DIAGNOSIS — E119 Type 2 diabetes mellitus without complications: Secondary | ICD-10-CM

## 2018-01-24 LAB — AMB POC HEMOGLOBIN A1C
Hemoglobin A1C, POC: 11.9 %
Hemoglobin A1c (POC): 11.9 %

## 2018-01-24 MED ORDER — LIDOCAINE 5 % (700 MG/PATCH) ADHESIVE PATCH
5 % | CUTANEOUS | 3 refills | Status: AC
Start: 2018-01-24 — End: ?

## 2018-01-24 MED ORDER — LORAZEPAM 0.5 MG TAB
0.5 mg | ORAL_TABLET | Freq: Every evening | ORAL | 0 refills | Status: DC | PRN
Start: 2018-01-24 — End: 2018-03-08

## 2018-01-24 MED ORDER — ESCITALOPRAM 10 MG TAB
10 mg | ORAL_TABLET | Freq: Every day | ORAL | 3 refills | Status: DC
Start: 2018-01-24 — End: 2018-03-08

## 2018-01-24 MED ORDER — ALBUTEROL SULFATE HFA 90 MCG/ACTUATION AEROSOL INHALER
90 mcg/actuation | Freq: Four times a day (QID) | RESPIRATORY_TRACT | 3 refills | Status: DC | PRN
Start: 2018-01-24 — End: 2018-08-30

## 2018-01-24 MED ORDER — ALBUTEROL SULFATE 0.083 % (0.83 MG/ML) SOLN FOR INHALATION
2.5 mg /3 mL (0.083 %) | INHALATION_SOLUTION | Freq: Four times a day (QID) | RESPIRATORY_TRACT | 2 refills | Status: DC | PRN
Start: 2018-01-24 — End: 2018-10-25

## 2018-01-24 MED ORDER — INSULIN GLARGINE 100 UNIT/ML (3 ML) SUB-Q PEN
100 unit/mL (3 mL) | PEN_INJECTOR | Freq: Every evening | SUBCUTANEOUS | 3 refills | Status: DC
Start: 2018-01-24 — End: 2018-03-08

## 2018-01-24 MED ORDER — INSULIN ASPART 100 UNIT/ML (3 ML) SUB-Q PEN
100 unit/mL (3 mL) | PEN_INJECTOR | SUBCUTANEOUS | 3 refills | Status: DC
Start: 2018-01-24 — End: 2018-03-08

## 2018-01-24 NOTE — Progress Notes (Signed)
Cathy [name] is a 53 y.o.  female and presents with     Chief Complaint   Patient presents with   ??? Hospital Follow Up     01/14/18 for high blood sugar levels, patient states vision is still blurry sine ED visit.    ??? Medication Refill     novolog and lantus, albuterol inhaler and neb solution, lidocaine 5% patches   ??? Coronary Artery Disease   ??? Anxiety   ??? Asthma   ??? Diabetes     Pt has been having uncontrolled blood sugars and blurry vision. Last A1c was 7.8.  Patient reports that she had not been watching her diet during the holidays and also had been missing some doses of insulin.  She has mild blurry vision.  She went to the ER on the seventh and her blood sugar was over 500.  Patient is also having a lot of anxiety.  She is seeing a psychiatrist who is treating her for ADHD.  She is also so being on Prozac and some other medications given by the psychiatrist for anxiety.  However she says is not helping.  Long time ago when she was in [place] she was on Ativan.  She is willing to try Lexapro for the anxiety but says she needs something right away.  She has a history of asthma and wants refills on her inhalers.            Past Medical History:   Diagnosis Date   ??? Asthma    ??? Depression    ??? Diabetes ([place])     Type 2 DM   ??? Hypercholesterolemia    ??? Hypertension      Past Surgical History:   Procedure Laterality Date   ??? CARDIAC SURG PROCEDURE UNLIST  2019    heart catherization    ??? HX ACL RECONSTRUCTION      right knee   ??? HX APPENDECTOMY     ??? HX ARTERIAL BYPASS      left main artery bypass   ??? HX HYSTERECTOMY     ??? HX WISDOM TEETH EXTRACTION       Current Outpatient Medications   Medication Sig   ??? lidocaine (LIDODERM) 5 % Apply to the back once daily   ??? insulin glargine (LANTUS SOLOSTAR U-100 INSULIN) 100 unit/mL (3 mL) inpn 60 Units by SubCUTAneous route nightly.   ??? insulin aspart U-100 (NOVOLOG FLEXPEN U-100 INSULIN) 100 unit/mL (3 mL) inpn adminitser 10 units subcut three times daily before  each meal   ??? albuterol (PROVENTIL HFA) 90 mcg/actuation inhaler Take 1 Puff by inhalation every six (6) hours as needed for Wheezing.   ??? albuterol (PROVENTIL VENTOLIN) 2.5 mg /3 mL (0.083 %) nebu Take 3 mL by inhalation every six (6) hours as needed for Wheezing.   ??? escitalopram oxalate (LEXAPRO) 10 mg tablet Take 1 Tab by mouth daily.   ??? LORazepam (ATIVAN) 0.5 mg tablet Take 1 Tab by mouth nightly as needed for Anxiety. Max Daily Amount: 0.5 mg.   ??? metoprolol tartrate (LOPRESSOR) 50 mg tablet TAKE ONE TABLET BY MOUTH TWICE A DAY   ??? diphenhydrAMINE (BENADRYL ALLERGY) 25 mg tablet Take 1 Tab by mouth nightly as needed for Sleep.   ??? lisinopril (PRINIVIL, ZESTRIL) 20 mg tablet Take 1 Tab by mouth daily.   ??? aspirin delayed-release 81 mg tablet Take 1 Tab by mouth daily.   ??? dextroamphetamine-amphetamine (ADDERALL) 20 mg tablet Take 20 mg by  mouth two (2) times a day.   ??? rosuvastatin (CRESTOR) 10 mg tablet Take 10 mg by mouth.   ??? traZODone (DESYREL) 100 mg tablet Take 100 mg by mouth nightly as needed.   ??? Blood-Glucose Meter (ONETOUCH ULTRA2 METER) monitoring kit Use as directed.   ??? montelukast (SINGULAIR) 10 mg tablet Take 1 Tab by mouth daily.   ??? naloxone (NARCAN) 4 mg/actuation nasal spray Use 1 spray intranasally, then discard. Repeat with new spray every 2 min as needed for opioid overdose symptoms, alternating nostrils.   ??? carisoprodol (SOMA) 350 mg tablet    ??? glucose blood VI test strips (TRUE METRIX GLUCOSE TEST STRIP) strip Use as instructed   ??? lancets (TRUEPLUS LANCETS) 28 gauge misc Use as directed     No current facility-administered medications for this visit.      Health Maintenance   Topic Date Due   ??? Hepatitis C Screening  October 04, 1965   ??? FOOT EXAM Q1  09/13/1973   ??? EYE EXAM RETINAL OR DILATED  09/13/1973   ??? DTaP/Tdap/Td series (1 - Tdap) 09/14/1974   ??? Shingrix Vaccine Age 63> (1 of 2) 09/13/2013   ??? FOBT Q 1 YEAR AGE 93-75  09/13/2013   ??? Influenza Age 81 to Adult  03/24/2018  (Originally 08/12/2017)   ??? MICROALBUMIN Q1  04/06/2018   ??? LIPID PANEL Q1  04/06/2018   ??? HEMOGLOBIN A1C Q6M  04/15/2018   ??? PAP AKA CERVICAL CYTOLOGY  03/06/2019   ??? BREAST CANCER SCRN MAMMOGRAM  04/23/2019   ??? Pneumococcal 0-64 years  Completed     Immunization History   Administered Date(s) Administered   ??? Pneumococcal Polysaccharide (PPSV-23) 04/26/2017     No LMP recorded. Patient has had a hysterectomy.        Allergies and Intolerances:   Allergies   Allergen Reactions   ??? Pyridium [Phenazopyridine] Itching       Family History:   No family history on file.    Social History:   She  reports that she has been smoking. She has a 10.00 pack-year smoking history. She has quit using smokeless tobacco.  She  reports previous alcohol use.            Review of Systems: Positive for anxiety  General: negative for - chills, fatigue, fever, weight change  Psych: negative for - anxiety, depression, irritability or mood swings  ENT: negative for - headaches, hearing change, nasal congestion, oral lesions, sneezing or sore throat  Heme/ Lymph: negative for - bleeding problems, bruising, pallor or swollen lymph nodes  Endo: negative for - hot flashes, polydipsia/polyuria or temperature intolerance  Resp: negative for - cough, shortness of breath or wheezing  CV: negative for - chest pain, edema or palpitations  GI: negative for - abdominal pain, change in bowel habits, constipation, diarrhea or nausea/vomiting  GU: negative for - dysuria, hematuria, incontinence, pelvic pain or vulvar/vaginal symptoms  MSK: negative for - joint pain, joint swelling or muscle pain  Neuro: negative for - confusion, headaches, seizures or weakness  Derm: negative for - dry skin, hair changes, rash or skin lesion changes          Physical:   Vitals:   Vitals:    01/24/18 0811   BP: 145/76   Pulse: (!) 52   Resp: 16   Temp: 98.1 ??F (36.7 ??C)   TempSrc: Oral   SpO2: 99%   Weight: 184 lb 6.4 oz (83.6 kg)   Height:  _0  (1.727 m)           Exam:    HEENT- atraumatic,normocephalic, awake, oriented, well nourished  Neck - supple,no enlarged lymph nodes, no JVD, no thyromegaly  Chest- CTA, no rhonchi, no crackles  Heart- rrr, no murmurs / gallop/rub  Abdomen- soft,BS+,NT, no hepatosplenomegaly  Ext - no c/c/edema   Neuro- no focal deficits.Power 5/5 all extremities  Skin - warm,dry, no obvious rashes.          Review of Data:   LABS:   Lab Results   Component Value Date/Time    WBC 4.5 01/14/2018 11:28 AM    HGB 14.9 01/14/2018 11:28 AM    HCT 43.5 01/14/2018 11:28 AM    PLATELET 239 01/14/2018 11:28 AM     Lab Results   Component Value Date/Time    Sodium 137 01/14/2018 11:28 AM    Potassium 3.8 01/14/2018 11:28 AM    Chloride 107 01/14/2018 11:28 AM    CO2 25 01/14/2018 11:28 AM    Glucose 309 (H) 01/14/2018 11:28 AM    BUN 13 01/14/2018 11:28 AM    Creatinine 0.86 01/14/2018 11:28 AM     Lab Results   Component Value Date/Time    Cholesterol, total 160 04/05/2017 12:32 PM    HDL Cholesterol 61 04/05/2017 12:32 PM    LDL, calculated 77 04/05/2017 12:32 PM    Triglyceride 112 04/05/2017 12:32 PM     No results found for: GPT        Impression / Plan:        ICD-10-CM ICD-9-CM    1. DM type 2, goal HbA1c < 7% (HCC) E11.9 250.00 AMB POC HEMOGLOBIN A1C   2. Mild intermittent asthma without complication A54.09 811.91 albuterol (PROVENTIL HFA) 90 mcg/actuation inhaler      albuterol (PROVENTIL VENTOLIN) 2.5 mg /3 mL (0.083 %) nebu   3. Chronic midline low back pain without sciatica M54.5 724.2     G89.29 338.29    4. Anxiety F41.9 300.00 escitalopram oxalate (LEXAPRO) 10 mg tablet      LORazepam (ATIVAN) 0.5 mg tablet       Sliding scale for Novolog      Blood sugar                     UNits    0-100                                  0  101-150                              3  151-200                              6  201-250                              9  251-300                             12  301-350                             15  351-400  18    Diabetes uncontrolled-asked patient to watch her diet, monitor blood sugars  Will increase Lantus to 60 units daily and tighten sliding scale for NovoLog.      Explained to patient risk benefits of the medications.Advised patient to stop meds if having any side effects.Pt verbalized understanding of the instructions.    I have discussed the diagnosis with the patient and the intended plan as seen in the above orders.  The patient has received an after-visit summary and questions were answered concerning future plans.  I have discussed medication side effects and warnings with the patient as well. I have reviewed the plan of care with the patient, accepted their input and they are in agreement with the treatment goals.     Reviewed plan of care. Patient has provided input and agrees with goals.    Follow-up and Dispositions    ?? Return in about 1 month (around 02/24/2018).         Elnora Morrison, MD

## 2018-01-24 NOTE — Progress Notes (Signed)
 Cathy [name] is a 53 y.o. female    Chief Complaint   Patient presents with   . Hospital Follow Up     01/14/18 for high blood sugar levels, patient states vision is still blurry sine ED visit.    . Medication Refill     novolog and lantus, albuterol inhaler and neb solution, lidocaine 5% patches       1. Have you been to the ER, urgent care clinic since your last visit?  Hospitalized since your last visit?No    2. Have you seen or consulted any other health care providers outside of the [facility] since your last visit?  Include any pap smears or colon screening. No    No flowsheet data found.     Health Maintenance Due   Topic Date Due   . Hepatitis C Screening  04-29-1965   . FOOT EXAM Q1  09/13/1973   . EYE EXAM RETINAL OR DILATED  09/13/1973   . DTaP/Tdap/Td series (1 - Tdap) 09/14/1974   . Shingrix Vaccine Age 24> (1 of 2) 09/13/2013   . FOBT Q 1 YEAR AGE 9-75  09/13/2013

## 2018-01-24 NOTE — Progress Notes (Signed)
Cathy [name] is a 53 y.o.  female and presents with     Chief Complaint   Patient presents with   ??? Hospital Follow Up     01/14/18 for high blood sugar levels, patient states vision is still blurry sine ED visit.    ??? Medication Refill     novolog and lantus, albuterol inhaler and neb solution, lidocaine 5% patches   ??? Coronary Artery Disease   ??? Anxiety   ??? Asthma   ??? Diabetes     Pt has been having uncontrolled blood sugars and blurry vision. Last A1c was 7.8.  Patient reports that she had not been watching her diet during the holidays and also had been missing some doses of insulin.  She has mild blurry vision.  She went to the ER on the seventh and her blood sugar was over 500.  Patient is also having a lot of anxiety.  She is seeing a psychiatrist who is treating her for ADHD.  She is also so being on Prozac and some other medications given by the psychiatrist for anxiety.  However she says is not helping.  Long time ago when she was in [place] she was on Ativan.  She is willing to try Lexapro for the anxiety but says she needs something right away.  She has a history of asthma and wants refills on her inhalers.            Past Medical History:   Diagnosis Date   ??? Asthma    ??? Depression    ??? Diabetes ([place])     Type 2 DM   ??? Hypercholesterolemia    ??? Hypertension      Past Surgical History:   Procedure Laterality Date   ??? CARDIAC SURG PROCEDURE UNLIST  2019    heart catherization    ??? HX ACL RECONSTRUCTION      right knee   ??? HX APPENDECTOMY     ??? HX ARTERIAL BYPASS      left main artery bypass   ??? HX HYSTERECTOMY     ??? HX WISDOM TEETH EXTRACTION       Current Outpatient Medications   Medication Sig   ??? lidocaine (LIDODERM) 5 % Apply to the back once daily   ??? insulin glargine (LANTUS SOLOSTAR U-100 INSULIN) 100 unit/mL (3 mL) inpn 60 Units by SubCUTAneous route nightly.   ??? insulin aspart U-100 (NOVOLOG FLEXPEN U-100 INSULIN) 100 unit/mL (3 mL)  inpn adminitser 10 units subcut three times daily before each meal   ??? albuterol (PROVENTIL HFA) 90 mcg/actuation inhaler Take 1 Puff by inhalation every six (6) hours as needed for Wheezing.   ??? albuterol (PROVENTIL VENTOLIN) 2.5 mg /3 mL (0.083 %) nebu Take 3 mL by inhalation every six (6) hours as needed for Wheezing.   ??? escitalopram oxalate (LEXAPRO) 10 mg tablet Take 1 Tab by mouth daily.   ??? LORazepam (ATIVAN) 0.5 mg tablet Take 1 Tab by mouth nightly as needed for Anxiety. Max Daily Amount: 0.5 mg.   ??? metoprolol tartrate (LOPRESSOR) 50 mg tablet TAKE ONE TABLET BY MOUTH TWICE A DAY   ??? diphenhydrAMINE (BENADRYL ALLERGY) 25 mg tablet Take 1 Tab by mouth nightly as needed for Sleep.   ??? lisinopril (PRINIVIL, ZESTRIL) 20 mg tablet Take 1 Tab by mouth daily.   ??? aspirin delayed-release 81 mg tablet Take 1 Tab by mouth daily.   ??? dextroamphetamine-amphetamine (ADDERALL) 20 mg tablet Take 20 mg by  mouth two (2) times a day.   ??? rosuvastatin (CRESTOR) 10 mg tablet Take 10 mg by mouth.   ??? traZODone (DESYREL) 100 mg tablet Take 100 mg by mouth nightly as needed.   ??? Blood-Glucose Meter (ONETOUCH ULTRA2 METER) monitoring kit Use as directed.   ??? montelukast (SINGULAIR) 10 mg tablet Take 1 Tab by mouth daily.   ??? naloxone (NARCAN) 4 mg/actuation nasal spray Use 1 spray intranasally, then discard. Repeat with new spray every 2 min as needed for opioid overdose symptoms, alternating nostrils.   ??? carisoprodol (SOMA) 350 mg tablet    ??? glucose blood VI test strips (TRUE METRIX GLUCOSE TEST STRIP) strip Use as instructed   ??? lancets (TRUEPLUS LANCETS) 28 gauge misc Use as directed     No current facility-administered medications for this visit.      Health Maintenance   Topic Date Due   ??? Hepatitis C Screening  08/26/1965   ??? FOOT EXAM Q1  09/13/1973   ??? EYE EXAM RETINAL OR DILATED  09/13/1973   ??? DTaP/Tdap/Td series (1 - Tdap) 09/14/1974   ??? Shingrix Vaccine Age 52> (1 of 2) 09/13/2013    ??? FOBT Q 1 YEAR AGE 85-75  09/13/2013   ??? Influenza Age 10 to Adult  03/24/2018 (Originally 08/12/2017)   ??? MICROALBUMIN Q1  04/06/2018   ??? LIPID PANEL Q1  04/06/2018   ??? HEMOGLOBIN A1C Q6M  04/15/2018   ??? PAP AKA CERVICAL CYTOLOGY  03/06/2019   ??? BREAST CANCER SCRN MAMMOGRAM  04/23/2019   ??? Pneumococcal 0-64 years  Completed     Immunization History   Administered Date(s) Administered   ??? Pneumococcal Polysaccharide (PPSV-23) 04/26/2017     No LMP recorded. Patient has had a hysterectomy.        Allergies and Intolerances:   Allergies   Allergen Reactions   ??? Pyridium [Phenazopyridine] Itching       Family History:   No family history on file.    Social History:   She  reports that she has been smoking. She has a 10.00 pack-year smoking history. She has quit using smokeless tobacco.  She  reports previous alcohol use.            Review of Systems: Positive for anxiety  General: negative for - chills, fatigue, fever, weight change  Psych: negative for - anxiety, depression, irritability or mood swings  ENT: negative for - headaches, hearing change, nasal congestion, oral lesions, sneezing or sore throat  Heme/ Lymph: negative for - bleeding problems, bruising, pallor or swollen lymph nodes  Endo: negative for - hot flashes, polydipsia/polyuria or temperature intolerance  Resp: negative for - cough, shortness of breath or wheezing  CV: negative for - chest pain, edema or palpitations  GI: negative for - abdominal pain, change in bowel habits, constipation, diarrhea or nausea/vomiting  GU: negative for - dysuria, hematuria, incontinence, pelvic pain or vulvar/vaginal symptoms  MSK: negative for - joint pain, joint swelling or muscle pain  Neuro: negative for - confusion, headaches, seizures or weakness  Derm: negative for - dry skin, hair changes, rash or skin lesion changes          Physical:   Vitals:   Vitals:    01/24/18 0811   BP: 145/76   Pulse: (!) 52   Resp: 16   Temp: 98.1 ??F (36.7 ??C)   TempSrc: Oral    SpO2: 99%   Weight: 184 lb 6.4 oz (83.6 kg)  Height: '5\' 8"'$  (1.727 m)           Exam:   HEENT- atraumatic,normocephalic, awake, oriented, well nourished  Neck - supple,no enlarged lymph nodes, no JVD, no thyromegaly  Chest- CTA, no rhonchi, no crackles  Heart- rrr, no murmurs / gallop/rub  Abdomen- soft,BS+,NT, no hepatosplenomegaly  Ext - no c/c/edema   Neuro- no focal deficits.Power 5/5 all extremities  Skin - warm,dry, no obvious rashes.          Review of Data:   LABS:   Lab Results   Component Value Date/Time    WBC 4.5 01/14/2018 11:28 AM    HGB 14.9 01/14/2018 11:28 AM    HCT 43.5 01/14/2018 11:28 AM    PLATELET 239 01/14/2018 11:28 AM     Lab Results   Component Value Date/Time    Sodium 137 01/14/2018 11:28 AM    Potassium 3.8 01/14/2018 11:28 AM    Chloride 107 01/14/2018 11:28 AM    CO2 25 01/14/2018 11:28 AM    Glucose 309 (H) 01/14/2018 11:28 AM    BUN 13 01/14/2018 11:28 AM    Creatinine 0.86 01/14/2018 11:28 AM     Lab Results   Component Value Date/Time    Cholesterol, total 160 04/05/2017 12:32 PM    HDL Cholesterol 61 04/05/2017 12:32 PM    LDL, calculated 77 04/05/2017 12:32 PM    Triglyceride 112 04/05/2017 12:32 PM     No results found for: GPT        Impression / Plan:        ICD-10-CM ICD-9-CM    1. DM type 2, goal HbA1c < 7% (HCC) E11.9 250.00 AMB POC HEMOGLOBIN A1C   2. Mild intermittent asthma without complication X09.60 454.09 albuterol (PROVENTIL HFA) 90 mcg/actuation inhaler      albuterol (PROVENTIL VENTOLIN) 2.5 mg /3 mL (0.083 %) nebu   3. Chronic midline low back pain without sciatica M54.5 724.2     G89.29 338.29    4. Anxiety F41.9 300.00 escitalopram oxalate (LEXAPRO) 10 mg tablet      LORazepam (ATIVAN) 0.5 mg tablet       Sliding scale for Novolog      Blood sugar                     UNits    0-100                                  0  101-150                              3  151-200                              6  201-250                              9   251-300                             12  301-350                             15  351-400  18    Diabetes uncontrolled-asked patient to watch her diet, monitor blood sugars  Will increase Lantus to 60 units daily and tighten sliding scale for NovoLog.      Explained to patient risk benefits of the medications.Advised patient to stop meds if having any side effects.Pt verbalized understanding of the instructions.    I have discussed the diagnosis with the patient and the intended plan as seen in the above orders.  The patient has received an after-visit summary and questions were answered concerning future plans.  I have discussed medication side effects and warnings with the patient as well. I have reviewed the plan of care with the patient, accepted their input and they are in agreement with the treatment goals.     Reviewed plan of care. Patient has provided input and agrees with goals.    Follow-up and Dispositions    ?? Return in about 1 month (around 02/24/2018).         Elnora Morrison, MD

## 2018-01-24 NOTE — Patient Instructions (Signed)
Sliding scale for Novolog      Blood sugar                     UNits    0-100                                  0  101-150                              3  151-200                              6  201-250                              9  251-300                             12  301-350                             15  351-400                             18

## 2018-01-24 NOTE — Progress Notes (Signed)
Cathy [name] is a 52 y.o. female    Chief Complaint   Patient presents with   ??? Hospital Follow Up     01/14/18 for high blood sugar levels, patient states vision is still blurry sine ED visit.    ??? Medication Refill     novolog and lantus, albuterol inhaler and neb solution, lidocaine 5% patches       1. Have you been to the ER, urgent care clinic since your last visit?  Hospitalized since your last visit?No    2. Have you seen or consulted any other health care providers outside of the [facility] since your last visit?  Include any pap smears or colon screening. No    No flowsheet data found.     Health Maintenance Due   Topic Date Due   ??? Hepatitis C Screening  03/17/1965   ??? FOOT EXAM Q1  09/13/1973   ??? EYE EXAM RETINAL OR DILATED  09/13/1973   ??? DTaP/Tdap/Td series (1 - Tdap) 09/14/1974   ??? Shingrix Vaccine Age 50> (1 of 2) 09/13/2013   ??? FOBT Q 1 YEAR AGE 50-75  09/13/2013

## 2018-01-27 NOTE — Telephone Encounter (Signed)
[facility] called to clarify script for lidocaine.  The script reads for one patch but they come in a box of thirty.  Please advise if its ok to fill with the 30 patches.    [phone]

## 2018-01-28 NOTE — Telephone Encounter (Signed)
I meant 30 patches.

## 2018-01-28 NOTE — Telephone Encounter (Signed)
[facility] made aware one box rx Lidocaine patches would be ok per Dr. Danella Penton

## 2018-01-28 NOTE — Telephone Encounter (Signed)
Please advise

## 2018-02-21 ENCOUNTER — Ambulatory Visit: Attending: Internal Medicine | Primary: Internal Medicine

## 2018-02-21 ENCOUNTER — Ambulatory Visit: Admit: 2018-02-21 | Payer: PRIVATE HEALTH INSURANCE | Attending: Internal Medicine | Primary: Internal Medicine

## 2018-02-21 DIAGNOSIS — E119 Type 2 diabetes mellitus without complications: Secondary | ICD-10-CM

## 2018-02-21 LAB — AMB POC GLUCOSE BLOOD, BY GLUCOSE MONITORING DEVICE
Glucose POC: 217 mg/dL
Glucose, POC: 217 mg/dL

## 2018-02-21 MED ORDER — AMMONIUM LACTATE 12 % TOPICAL CREAM
12 % | Freq: Two times a day (BID) | CUTANEOUS | 3 refills | Status: DC
Start: 2018-02-21 — End: 2018-03-08

## 2018-02-21 NOTE — Progress Notes (Signed)
Cathy [name] is a 53 y.o.  female and presents with     Chief Complaint   Patient presents with   ??? Diabetes     follow up   ??? Anxiety   ??? Skin Problem   ??? Depression     Pt sees Psych and is now on [place].  Pt says she has been following the sliding scale and her sugars are less than 200.  Pt saw Ortho who has ordered MRI lumbar spine.  Also found bone spur in her shoulder.  Pt has dry skin.      Past Medical History:   Diagnosis Date   ??? Asthma    ??? Depression    ??? Diabetes ([place])     Type 2 DM   ??? Hypercholesterolemia    ??? Hypertension      Past Surgical History:   Procedure Laterality Date   ??? CARDIAC SURG PROCEDURE UNLIST  2019    heart catherization    ??? HX ACL RECONSTRUCTION      right knee   ??? HX APPENDECTOMY     ??? HX ARTERIAL BYPASS      left main artery bypass   ??? HX HYSTERECTOMY     ??? HX WISDOM TEETH EXTRACTION       Current Outpatient Medications   Medication Sig   ??? lurasidone (LATUDA) 20 mg tab tablet Take  by mouth.   ??? ammonium lactate (AMLACTIN) 12 % topical cream Apply  to affected area two (2) times a day. rub in to affected area well   ??? lidocaine (LIDODERM) 5 % Apply to the back once daily   ??? insulin glargine (LANTUS SOLOSTAR U-100 INSULIN) 100 unit/mL (3 mL) inpn 60 Units by SubCUTAneous route nightly.   ??? insulin aspart U-100 (NOVOLOG FLEXPEN U-100 INSULIN) 100 unit/mL (3 mL) inpn adminitser 10 units subcut three times daily before each meal   ??? albuterol (PROVENTIL HFA) 90 mcg/actuation inhaler Take 1 Puff by inhalation every six (6) hours as needed for Wheezing.   ??? albuterol (PROVENTIL VENTOLIN) 2.5 mg /3 mL (0.083 %) nebu Take 3 mL by inhalation every six (6) hours as needed for Wheezing.   ??? escitalopram oxalate (LEXAPRO) 10 mg tablet Take 1 Tab by mouth daily.   ??? LORazepam (ATIVAN) 0.5 mg tablet Take 1 Tab by mouth nightly as needed for Anxiety. Max Daily Amount: 0.5 mg.   ??? Blood-Glucose Meter (ONETOUCH ULTRA2 METER) monitoring kit Use as directed.   ??? metoprolol tartrate  (LOPRESSOR) 50 mg tablet TAKE ONE TABLET BY MOUTH TWICE A DAY   ??? montelukast (SINGULAIR) 10 mg tablet Take 1 Tab by mouth daily.   ??? naloxone (NARCAN) 4 mg/actuation nasal spray Use 1 spray intranasally, then discard. Repeat with new spray every 2 min as needed for opioid overdose symptoms, alternating nostrils.   ??? diphenhydrAMINE (BENADRYL ALLERGY) 25 mg tablet Take 1 Tab by mouth nightly as needed for Sleep.   ??? lisinopril (PRINIVIL, ZESTRIL) 20 mg tablet Take 1 Tab by mouth daily.   ??? carisoprodol (SOMA) 350 mg tablet    ??? aspirin delayed-release 81 mg tablet Take 1 Tab by mouth daily.   ??? dextroamphetamine-amphetamine (ADDERALL) 20 mg tablet Take 20 mg by mouth two (2) times a day.   ??? glucose blood VI test strips (TRUE METRIX GLUCOSE TEST STRIP) strip Use as instructed   ??? lancets (TRUEPLUS LANCETS) 28 gauge misc Use as directed   ??? rosuvastatin (CRESTOR) 10 mg  tablet Take 10 mg by mouth.   ??? traZODone (DESYREL) 100 mg tablet Take 100 mg by mouth nightly as needed.     No current facility-administered medications for this visit.      Health Maintenance   Topic Date Due   ??? Hepatitis C Screening  11[birth year]   ??? Foot Exam Q1  09/13/1973   ??? Eye Exam Retinal or Dilated  09/13/1973   ??? DTaP/Tdap/Td series (1 - Tdap) 09/14/1974   ??? Shingrix Vaccine Age 88> (1 of 2) 09/13/2013   ??? FOBT Q1Y Age 56-75  09/13/2013   ??? Influenza Age 70 to Adult  03/24/2018 (Originally 08/12/2017)   ??? MICROALBUMIN Q1  04/06/2018   ??? Lipid Screen  04/06/2018   ??? A1C test (Diabetic or Prediabetic)  04/25/2018   ??? PAP AKA CERVICAL CYTOLOGY  03/06/2019   ??? Breast Cancer Screen Mammogram  04/23/2019   ??? Pneumococcal 0-64 years  Completed     Immunization History   Administered Date(s) Administered   ??? Pneumococcal Polysaccharide (PPSV-23) 04/26/2017     No LMP recorded. Patient has had a hysterectomy.        Allergies and Intolerances:   Allergies   Allergen Reactions   ??? Pyridium [Phenazopyridine] Itching       Family History:   No family  history on file.    Social History:   She  reports that she has been smoking. She has a 10.00 pack-year smoking history. She has quit using smokeless tobacco.  She  reports previous alcohol use.            Review of Systems:   General: negative for - chills, fatigue, fever, weight change  Psych: negative for - anxiety, depression, irritability or mood swings  ENT: negative for - headaches, hearing change, nasal congestion, oral lesions, sneezing or sore throat  Heme/ Lymph: negative for - bleeding problems, bruising, pallor or swollen lymph nodes  Endo: negative for - hot flashes, polydipsia/polyuria or temperature intolerance  Resp: negative for - cough, shortness of breath or wheezing  CV: negative for - chest pain, edema or palpitations  GI: negative for - abdominal pain, change in bowel habits, constipation, diarrhea or nausea/vomiting  GU: negative for - dysuria, hematuria, incontinence, pelvic pain or vulvar/vaginal symptoms  MSK: negative for - joint pain, joint swelling or muscle pain  Neuro: negative for - confusion, headaches, seizures or weakness  Derm: negative for - dry skin, hair changes, rash or skin lesion changes          Physical:   Vitals:   Vitals:    02/21/18 0800   BP: 144/89   Pulse: 60   Resp: 18   Temp: 98.5 ??F (36.9 ??C)   TempSrc: Oral   SpO2: 99%   Weight: 180 lb 3.2 oz (81.7 kg)   Height: '5\' 8"'  (1.727 m)           Exam:   HEENT- atraumatic,normocephalic, awake, oriented, well nourished  Neck - supple,no enlarged lymph nodes, no JVD, no thyromegaly  Chest- CTA, no rhonchi, no crackles  Heart- rrr, no murmurs / gallop/rub  Abdomen- soft,BS+,NT, no hepatosplenomegaly  Ext - no c/c/edema   Neuro- no focal deficits.Power 5/5 all extremities  Skin - warm,dry, no obvious rashes.          Review of Data:   LABS:   Lab Results   Component Value Date/Time    WBC 4.5 01/14/2018 11:28 AM    HGB 14.9 01/14/2018  11:28 AM    HCT 43.5 01/14/2018 11:28 AM    PLATELET 239 01/14/2018 11:28 AM     Lab  Results   Component Value Date/Time    Sodium 137 01/14/2018 11:28 AM    Potassium 3.8 01/14/2018 11:28 AM    Chloride 107 01/14/2018 11:28 AM    CO2 25 01/14/2018 11:28 AM    Glucose 309 (H) 01/14/2018 11:28 AM    BUN 13 01/14/2018 11:28 AM    Creatinine 0.86 01/14/2018 11:28 AM     Lab Results   Component Value Date/Time    Cholesterol, total 160 04/05/2017 12:32 PM    HDL Cholesterol 61 04/05/2017 12:32 PM    LDL, calculated 77 04/05/2017 12:32 PM    Triglyceride 112 04/05/2017 12:32 PM     No results found for: GPT        Impression / Plan:        ICD-10-CM ICD-9-CM    1. DM type 2, goal HbA1c < 7% (HCC) E11.9 250.00 AMB POC GLUCOSE BLOOD, BY GLUCOSE MONITORING DEVICE   2. Depression, major, recurrent, mild (HCC) F33.0 296.31    3. Colon cancer screening Z12.11 V76.51 REFERRAL TO GASTROENTEROLOGY   4. Dry skin L85.3 701.1 ammonium lactate (AMLACTIN) 12 % topical cream     Blood sugars improving    Depression - sees psych.      Explained to patient risk benefits of the medications.Advised patient to stop meds if having any side effects.Pt verbalized understanding of the instructions.    I have discussed the diagnosis with the patient and the intended plan as seen in the above orders.  The patient has received an after-visit summary and questions were answered concerning future plans.  I have discussed medication side effects and warnings with the patient as well. I have reviewed the plan of care with the patient, accepted their input and they are in agreement with the treatment goals.     Reviewed plan of care. Patient has provided input and agrees with goals.    Follow-up and Dispositions    ?? Return in about 3 months (around 05/22/2018).         Elnora Morrison, MD

## 2018-02-21 NOTE — Progress Notes (Signed)
 Chief Complaint   Patient presents with   . Diabetes     follow up   . Anxiety       1. Have you been to the ER, urgent care clinic since your last visit?  Hospitalized since your last visit?No    2. Have you seen or consulted any other health care providers outside of the [facility] since your last visit?  Include any pap smears or colon screening. No

## 2018-02-21 NOTE — Progress Notes (Signed)
Cathy [name] is a 53 y.o.  female and presents with     Chief Complaint   Patient presents with   ??? Diabetes     follow up   ??? Anxiety   ??? Skin Problem   ??? Depression     Pt sees Psych and is now on [place].  Pt says she has been following the sliding scale and her sugars are less than 200.  Pt saw Ortho who has ordered MRI lumbar spine.  Also found bone spur in her shoulder.  Pt has dry skin.      Past Medical History:   Diagnosis Date   ??? Asthma    ??? Depression    ??? Diabetes ([place])     Type 2 DM   ??? Hypercholesterolemia    ??? Hypertension      Past Surgical History:   Procedure Laterality Date   ??? CARDIAC SURG PROCEDURE UNLIST  2019    heart catherization    ??? HX ACL RECONSTRUCTION      right knee   ??? HX APPENDECTOMY     ??? HX ARTERIAL BYPASS      left main artery bypass   ??? HX HYSTERECTOMY     ??? HX WISDOM TEETH EXTRACTION       Current Outpatient Medications   Medication Sig   ??? lurasidone (LATUDA) 20 mg tab tablet Take  by mouth.   ??? ammonium lactate (AMLACTIN) 12 % topical cream Apply  to affected area two (2) times a day. rub in to affected area well   ??? lidocaine (LIDODERM) 5 % Apply to the back once daily   ??? insulin glargine (LANTUS SOLOSTAR U-100 INSULIN) 100 unit/mL (3 mL) inpn 60 Units by SubCUTAneous route nightly.   ??? insulin aspart U-100 (NOVOLOG FLEXPEN U-100 INSULIN) 100 unit/mL (3 mL) inpn adminitser 10 units subcut three times daily before each meal   ??? albuterol (PROVENTIL HFA) 90 mcg/actuation inhaler Take 1 Puff by inhalation every six (6) hours as needed for Wheezing.   ??? albuterol (PROVENTIL VENTOLIN) 2.5 mg /3 mL (0.083 %) nebu Take 3 mL by inhalation every six (6) hours as needed for Wheezing.   ??? escitalopram oxalate (LEXAPRO) 10 mg tablet Take 1 Tab by mouth daily.   ??? LORazepam (ATIVAN) 0.5 mg tablet Take 1 Tab by mouth nightly as needed for Anxiety. Max Daily Amount: 0.5 mg.   ??? Blood-Glucose Meter (ONETOUCH ULTRA2 METER) monitoring kit Use as directed.    ??? metoprolol tartrate (LOPRESSOR) 50 mg tablet TAKE ONE TABLET BY MOUTH TWICE A DAY   ??? montelukast (SINGULAIR) 10 mg tablet Take 1 Tab by mouth daily.   ??? naloxone (NARCAN) 4 mg/actuation nasal spray Use 1 spray intranasally, then discard. Repeat with new spray every 2 min as needed for opioid overdose symptoms, alternating nostrils.   ??? diphenhydrAMINE (BENADRYL ALLERGY) 25 mg tablet Take 1 Tab by mouth nightly as needed for Sleep.   ??? lisinopril (PRINIVIL, ZESTRIL) 20 mg tablet Take 1 Tab by mouth daily.   ??? carisoprodol (SOMA) 350 mg tablet    ??? aspirin delayed-release 81 mg tablet Take 1 Tab by mouth daily.   ??? dextroamphetamine-amphetamine (ADDERALL) 20 mg tablet Take 20 mg by mouth two (2) times a day.   ??? glucose blood VI test strips (TRUE METRIX GLUCOSE TEST STRIP) strip Use as instructed   ??? lancets (TRUEPLUS LANCETS) 28 gauge misc Use as directed   ??? rosuvastatin (CRESTOR) 10 mg  tablet Take 10 mg by mouth.   ??? traZODone (DESYREL) 100 mg tablet Take 100 mg by mouth nightly as needed.     No current facility-administered medications for this visit.      Health Maintenance   Topic Date Due   ??? Hepatitis C Screening  16-Mar-1965   ??? Foot Exam Q1  09/13/1973   ??? Eye Exam Retinal or Dilated  09/13/1973   ??? DTaP/Tdap/Td series (1 - Tdap) 09/14/1974   ??? Shingrix Vaccine Age 68> (1 of 2) 09/13/2013   ??? FOBT Q1Y Age 64-75  09/13/2013   ??? Influenza Age 93 to Adult  03/24/2018 (Originally 08/12/2017)   ??? MICROALBUMIN Q1  04/06/2018   ??? Lipid Screen  04/06/2018   ??? A1C test (Diabetic or Prediabetic)  04/25/2018   ??? PAP AKA CERVICAL CYTOLOGY  03/06/2019   ??? Breast Cancer Screen Mammogram  04/23/2019   ??? Pneumococcal 0-64 years  Completed     Immunization History   Administered Date(s) Administered   ??? Pneumococcal Polysaccharide (PPSV-23) 04/26/2017     No LMP recorded. Patient has had a hysterectomy.        Allergies and Intolerances:   Allergies   Allergen Reactions   ??? Pyridium [Phenazopyridine] Itching        Family History:   No family history on file.    Social History:   She  reports that she has been smoking. She has a 10.00 pack-year smoking history. She has quit using smokeless tobacco.  She  reports previous alcohol use.            Review of Systems:   General: negative for - chills, fatigue, fever, weight change  Psych: negative for - anxiety, depression, irritability or mood swings  ENT: negative for - headaches, hearing change, nasal congestion, oral lesions, sneezing or sore throat  Heme/ Lymph: negative for - bleeding problems, bruising, pallor or swollen lymph nodes  Endo: negative for - hot flashes, polydipsia/polyuria or temperature intolerance  Resp: negative for - cough, shortness of breath or wheezing  CV: negative for - chest pain, edema or palpitations  GI: negative for - abdominal pain, change in bowel habits, constipation, diarrhea or nausea/vomiting  GU: negative for - dysuria, hematuria, incontinence, pelvic pain or vulvar/vaginal symptoms  MSK: negative for - joint pain, joint swelling or muscle pain  Neuro: negative for - confusion, headaches, seizures or weakness  Derm: negative for - dry skin, hair changes, rash or skin lesion changes          Physical:   Vitals:   Vitals:    02/21/18 0800   BP: 144/89   Pulse: 60   Resp: 18   Temp: 98.5 ??F (36.9 ??C)   TempSrc: Oral   SpO2: 99%   Weight: 180 lb 3.2 oz (81.7 kg)   Height: '5\' 8"'$  (1.727 m)           Exam:   HEENT- atraumatic,normocephalic, awake, oriented, well nourished  Neck - supple,no enlarged lymph nodes, no JVD, no thyromegaly  Chest- CTA, no rhonchi, no crackles  Heart- rrr, no murmurs / gallop/rub  Abdomen- soft,BS+,NT, no hepatosplenomegaly  Ext - no c/c/edema   Neuro- no focal deficits.Power 5/5 all extremities  Skin - warm,dry, no obvious rashes.          Review of Data:   LABS:   Lab Results   Component Value Date/Time    WBC 4.5 01/14/2018 11:28 AM    HGB 14.9 01/14/2018  11:28 AM    HCT 43.5 01/14/2018 11:28 AM     PLATELET 239 01/14/2018 11:28 AM     Lab Results   Component Value Date/Time    Sodium 137 01/14/2018 11:28 AM    Potassium 3.8 01/14/2018 11:28 AM    Chloride 107 01/14/2018 11:28 AM    CO2 25 01/14/2018 11:28 AM    Glucose 309 (H) 01/14/2018 11:28 AM    BUN 13 01/14/2018 11:28 AM    Creatinine 0.86 01/14/2018 11:28 AM     Lab Results   Component Value Date/Time    Cholesterol, total 160 04/05/2017 12:32 PM    HDL Cholesterol 61 04/05/2017 12:32 PM    LDL, calculated 77 04/05/2017 12:32 PM    Triglyceride 112 04/05/2017 12:32 PM     No results found for: GPT        Impression / Plan:        ICD-10-CM ICD-9-CM    1. DM type 2, goal HbA1c < 7% (HCC) E11.9 250.00 AMB POC GLUCOSE BLOOD, BY GLUCOSE MONITORING DEVICE   2. Depression, major, recurrent, mild (HCC) F33.0 296.31    3. Colon cancer screening Z12.11 V76.51 REFERRAL TO GASTROENTEROLOGY   4. Dry skin L85.3 701.1 ammonium lactate (AMLACTIN) 12 % topical cream     Blood sugars improving    Depression - sees psych.      Explained to patient risk benefits of the medications.Advised patient to stop meds if having any side effects.Pt verbalized understanding of the instructions.    I have discussed the diagnosis with the patient and the intended plan as seen in the above orders.  The patient has received an after-visit summary and questions were answered concerning future plans.  I have discussed medication side effects and warnings with the patient as well. I have reviewed the plan of care with the patient, accepted their input and they are in agreement with the treatment goals.     Reviewed plan of care. Patient has provided input and agrees with goals.    Follow-up and Dispositions    ?? Return in about 3 months (around 05/22/2018).         Elnora Morrison, MD

## 2018-02-21 NOTE — Progress Notes (Signed)
Chief Complaint   Patient presents with   ??? Diabetes     follow up   ??? Anxiety       1. Have you been to the ER, urgent care clinic since your last visit?  Hospitalized since your last visit?No    2. Have you seen or consulted any other health care providers outside of the [facility] since your last visit?  Include any pap smears or colon screening. No

## 2018-03-08 ENCOUNTER — Ambulatory Visit: Attending: Internal Medicine | Primary: Internal Medicine

## 2018-03-08 ENCOUNTER — Ambulatory Visit: Admit: 2018-03-08 | Payer: PRIVATE HEALTH INSURANCE | Attending: Internal Medicine | Primary: Internal Medicine

## 2018-03-08 DIAGNOSIS — R590 Localized enlarged lymph nodes: Secondary | ICD-10-CM

## 2018-03-08 MED ORDER — CLOBETASOL 0.05 % TOPICAL CREAM
0.05 % | Freq: Two times a day (BID) | CUTANEOUS | 3 refills | Status: AC
Start: 2018-03-08 — End: ?

## 2018-03-08 MED ORDER — LORAZEPAM 0.5 MG TAB
0.5 mg | ORAL_TABLET | Freq: Every evening | ORAL | 0 refills | Status: DC | PRN
Start: 2018-03-08 — End: 2018-04-25

## 2018-03-08 MED ORDER — ESCITALOPRAM 10 MG TAB
10 mg | ORAL_TABLET | Freq: Every day | ORAL | 3 refills | Status: AC
Start: 2018-03-08 — End: ?

## 2018-03-08 MED ORDER — ASPIRIN 81 MG TAB, DELAYED RELEASE
81 mg | ORAL_TABLET | Freq: Every day | ORAL | 3 refills | Status: AC
Start: 2018-03-08 — End: ?

## 2018-03-08 MED ORDER — AMMONIUM LACTATE 12 % TOPICAL CREAM
12 % | Freq: Two times a day (BID) | CUTANEOUS | 3 refills | Status: AC
Start: 2018-03-08 — End: ?

## 2018-03-08 MED ORDER — INSULIN GLARGINE 100 UNIT/ML (3 ML) SUB-Q PEN
100 unit/mL (3 mL) | PEN_INJECTOR | Freq: Every evening | SUBCUTANEOUS | 3 refills | Status: DC
Start: 2018-03-08 — End: 2018-08-30

## 2018-03-08 MED ORDER — INSULIN NEEDLES (DISPOSABLE) 31 X 5/16"
31 gauge x 5/16" | PACK | 11 refills | Status: DC
Start: 2018-03-08 — End: 2018-08-30

## 2018-03-08 MED ORDER — INSULIN ASPART 100 UNIT/ML (3 ML) SUB-Q PEN
100 unit/mL (3 mL) | PEN_INJECTOR | SUBCUTANEOUS | 3 refills | Status: DC
Start: 2018-03-08 — End: 2018-08-30

## 2018-03-08 NOTE — Progress Notes (Signed)
Chief Complaint   Patient presents with   . Neck Pain   . Headache       1. Have you been to the ER, urgent care clinic since your last visit?  Hospitalized since your last visit?No    2. Have you seen or consulted any other health care providers outside of the [facility] since your last visit?  Include any pap smears or colon screening. No

## 2018-03-08 NOTE — Progress Notes (Signed)
Cathy [name] is a 53 y.o.  female and presents with     Chief Complaint   Patient presents with   ??? Neck Pain   ??? Headache   ??? Skin Problem     Pt has neck pain, headaches .She is concerned of enlarged lymphnode in her neck on the rt side where it hurts her the most.  Insurance declined her CT scan.  Pt has dry skin and itches all the time  She has anxiety and that makes her itch even more.  She is requesting refills on some of her meds.          Past Medical History:   Diagnosis Date   ??? Asthma    ??? Depression    ??? Diabetes (Between)     Type 2 DM   ??? Hypercholesterolemia    ??? Hypertension      Past Surgical History:   Procedure Laterality Date   ??? CARDIAC SURG PROCEDURE UNLIST  2019    heart catherization    ??? HX ACL RECONSTRUCTION      right knee   ??? HX APPENDECTOMY     ??? HX ARTERIAL BYPASS      left main artery bypass   ??? HX HYSTERECTOMY     ??? HX WISDOM TEETH EXTRACTION       Current Outpatient Medications   Medication Sig   ??? clobetasoL (TEMOVATE) 0.05 % topical cream Apply  to affected area two (2) times a day.   ??? Insulin Needles, Disposable, 31 gauge x 5/16" ndle Administer NOvolog 10 uniys day before each meal   ??? insulin aspart U-100 (NOVOLOG FLEXPEN U-100 INSULIN) 100 unit/mL (3 mL) inpn adminitser 10 units subcut three times daily before each meal   ??? escitalopram oxalate (LEXAPRO) 10 mg tablet Take 1 Tab by mouth daily.   ??? LORazepam (ATIVAN) 0.5 mg tablet Take 1 Tab by mouth nightly as needed for Anxiety. Max Daily Amount: 0.5 mg.   ??? ammonium lactate (AMLACTIN) 12 % topical cream Apply  to affected area two (2) times a day. rub in to affected area well   ??? insulin glargine (LANTUS SOLOSTAR U-100 INSULIN) 100 unit/mL (3 mL) inpn 60 Units by SubCUTAneous route nightly.   ??? aspirin delayed-release 81 mg tablet Take 1 Tab by mouth daily.   ??? lurasidone (LATUDA) 20 mg tab tablet Take  by mouth.   ??? lidocaine (LIDODERM) 5 % Apply to the back once daily   ??? albuterol (PROVENTIL HFA) 90 mcg/actuation inhaler  Take 1 Puff by inhalation every six (6) hours as needed for Wheezing.   ??? albuterol (PROVENTIL VENTOLIN) 2.5 mg /3 mL (0.083 %) nebu Take 3 mL by inhalation every six (6) hours as needed for Wheezing.   ??? Blood-Glucose Meter (ONETOUCH ULTRA2 METER) monitoring kit Use as directed.   ??? metoprolol tartrate (LOPRESSOR) 50 mg tablet TAKE ONE TABLET BY MOUTH TWICE A DAY   ??? montelukast (SINGULAIR) 10 mg tablet Take 1 Tab by mouth daily.   ??? naloxone (NARCAN) 4 mg/actuation nasal spray Use 1 spray intranasally, then discard. Repeat with new spray every 2 min as needed for opioid overdose symptoms, alternating nostrils.   ??? diphenhydrAMINE (BENADRYL ALLERGY) 25 mg tablet Take 1 Tab by mouth nightly as needed for Sleep.   ??? lisinopril (PRINIVIL, ZESTRIL) 20 mg tablet Take 1 Tab by mouth daily.   ??? carisoprodol (SOMA) 350 mg tablet    ??? dextroamphetamine-amphetamine (ADDERALL) 20 mg tablet  Take 20 mg by mouth two (2) times a day.   ??? glucose blood VI test strips (TRUE METRIX GLUCOSE TEST STRIP) strip Use as instructed   ??? lancets (TRUEPLUS LANCETS) 28 gauge misc Use as directed   ??? rosuvastatin (CRESTOR) 10 mg tablet Take 10 mg by mouth.   ??? traZODone (DESYREL) 100 mg tablet Take 100 mg by mouth nightly as needed.     No current facility-administered medications for this visit.      Health Maintenance   Topic Date Due   ??? Hepatitis C Screening  10/[birth year]   ??? Foot Exam Q1  09/13/1973   ??? Eye Exam Retinal or Dilated  09/13/1973   ??? DTaP/Tdap/Td series (1 - Tdap) 09/14/1974   ??? Shingrix Vaccine Age 46> (1 of 2) 09/13/2013   ??? FOBT Q1Y Age 46-75  09/13/2013   ??? MICROALBUMIN Q1  04/06/2018   ??? Influenza Age 69 to Adult  03/24/2018 (Originally 08/12/2017)   ??? Lipid Screen  04/06/2018   ??? A1C test (Diabetic or Prediabetic)  04/25/2018   ??? PAP AKA CERVICAL CYTOLOGY  03/06/2019   ??? Breast Cancer Screen Mammogram  04/23/2019   ??? Pneumococcal 0-64 years  Completed     Immunization History   Administered Date(s) Administered   ???  Pneumococcal Polysaccharide (PPSV-23) 04/26/2017     No LMP recorded. Patient has had a hysterectomy.        Allergies and Intolerances:   Allergies   Allergen Reactions   ??? Pyridium [Phenazopyridine] Itching       Family History:   No family history on file.    Social History:   She  reports that she has been smoking. She has a 10.00 pack-year smoking history. She has quit using smokeless tobacco.  She  reports previous alcohol use.            Review of Systems:   General: negative for - chills, fatigue, fever, weight change  Psych: negative for - anxiety, depression, irritability or mood swings  ENT: negative for - headaches, hearing change, nasal congestion, oral lesions, sneezing or sore throat  Heme/ Lymph: negative for - bleeding problems, bruising, pallor or swollen lymph nodes  Endo: negative for - hot flashes, polydipsia/polyuria or temperature intolerance  Resp: negative for - cough, shortness of breath or wheezing  CV: negative for - chest pain, edema or palpitations  GI: negative for - abdominal pain, change in bowel habits, constipation, diarrhea or nausea/vomiting  GU: negative for - dysuria, hematuria, incontinence, pelvic pain or vulvar/vaginal symptoms  MSK: negative for - joint pain, joint swelling or muscle pain  Neuro: negative for - confusion, headaches, seizures or weakness  Derm: negative for - dry skin, hair changes, rash or skin lesion changes          Physical:   Vitals:   Vitals:    03/08/18 0803   BP: 132/80   Pulse: (!) 58   Resp: 18   Temp: 98.7 ??F (37.1 ??C)   TempSrc: Oral   SpO2: 99%   Weight: 182 lb 3.2 oz (82.6 kg)   Height: '5\' 8"'  (1.727 m)           Exam:   HEENT- atraumatic,normocephalic, awake, oriented, well nourished  Neck - supple,pos  enlarged lymph nodes rt posterior upper cervical LN,tender to touch.  Chest- CTA, no rhonchi, no crackles, eczematous  lesion rt  Chest wall below the rt breast.,   Heart- rrr, no murmurs / gallop/rub  Abdomen- soft,BS+,NT, no  hepatosplenomegaly  Ext - no c/c/edema   Neuro- no focal deficits.Power 5/5 all extremities  Skin - warm,pos dryskin          Review of Data:   LABS:   Lab Results   Component Value Date/Time    WBC 4.5 01/14/2018 11:28 AM    HGB 14.9 01/14/2018 11:28 AM    HCT 43.5 01/14/2018 11:28 AM    PLATELET 239 01/14/2018 11:28 AM     Lab Results   Component Value Date/Time    Sodium 137 01/14/2018 11:28 AM    Potassium 3.8 01/14/2018 11:28 AM    Chloride 107 01/14/2018 11:28 AM    CO2 25 01/14/2018 11:28 AM    Glucose 309 (H) 01/14/2018 11:28 AM    BUN 13 01/14/2018 11:28 AM    Creatinine 0.86 01/14/2018 11:28 AM     Lab Results   Component Value Date/Time    Cholesterol, total 160 04/05/2017 12:32 PM    HDL Cholesterol 61 04/05/2017 12:32 PM    LDL, calculated 77 04/05/2017 12:32 PM    Triglyceride 112 04/05/2017 12:32 PM     No results found for: GPT        Impression / Plan:        ICD-10-CM ICD-9-CM    1. Cervical lymphadenopathy R59.0 785.6 REFERRAL TO ENT-OTOLARYNGOLOGY      CT NECK SOFT TISSUE W WO CONT   2. Smoker F17.200 305.1 CT NECK SOFT TISSUE W WO CONT   3. Neck pain M54.2 723.1 REFERRAL TO ENT-OTOLARYNGOLOGY      CT NECK SOFT TISSUE W WO CONT   4. Nonintractable headache, unspecified chronicity pattern, unspecified headache type R51 784.0 REFERRAL TO ENT-OTOLARYNGOLOGY      CT NECK SOFT TISSUE W WO CONT   5. Eczema, unspecified type L30.9 692.9 clobetasoL (TEMOVATE) 0.05 % topical cream   6. Anxiety F41.9 300.00 escitalopram oxalate (LEXAPRO) 10 mg tablet      LORazepam (ATIVAN) 0.5 mg tablet   7. Dry skin L85.3 701.1 ammonium lactate (AMLACTIN) 12 % topical cream   8. DM type 2, goal HbA1c < 7% (HCC) E11.9 250.00 Insulin Needles, Disposable, 31 gauge x 5/16" ndle      insulin aspart U-100 (NOVOLOG FLEXPEN U-100 INSULIN) 100 unit/mL (3 mL) inpn      insulin glargine (LANTUS SOLOSTAR U-100 INSULIN) 100 unit/mL (3 mL) inpn      aspirin delayed-release 81 mg tablet         Explained to patient risk benefits of the  medications.Advised patient to stop meds if having any side effects.Pt verbalized understanding of the instructions.    I have discussed the diagnosis with the patient and the intended plan as seen in the above orders.  The patient has received an after-visit summary and questions were answered concerning future plans.  I have discussed medication side effects and warnings with the patient as well. I have reviewed the plan of care with the patient, accepted their input and they are in agreement with the treatment goals.     Reviewed plan of care. Patient has provided input and agrees with goals.        Elnora Morrison, MD

## 2018-03-08 NOTE — Progress Notes (Signed)
Chief Complaint   Patient presents with   ??? Neck Pain   ??? Headache       1. Have you been to the ER, urgent care clinic since your last visit?  Hospitalized since your last visit?No    2. Have you seen or consulted any other health care providers outside of the [facility] since your last visit?  Include any pap smears or colon screening. No

## 2018-03-08 NOTE — Progress Notes (Signed)
Cathy [name] is a 53 y.o.  female and presents with     Chief Complaint   Patient presents with   ??? Neck Pain   ??? Headache   ??? Skin Problem     Pt has neck pain, headaches .She is concerned of enlarged lymphnode in her neck on the rt side where it hurts her the most.  Insurance declined her CT scan.  Pt has dry skin and itches all the time  She has anxiety and that makes her itch even more.  She is requesting refills on some of her meds.          Past Medical History:   Diagnosis Date   ??? Asthma    ??? Depression    ??? Diabetes ([place])     Type 2 DM   ??? Hypercholesterolemia    ??? Hypertension      Past Surgical History:   Procedure Laterality Date   ??? CARDIAC SURG PROCEDURE UNLIST  2019    heart catherization    ??? HX ACL RECONSTRUCTION      right knee   ??? HX APPENDECTOMY     ??? HX ARTERIAL BYPASS      left main artery bypass   ??? HX HYSTERECTOMY     ??? HX WISDOM TEETH EXTRACTION       Current Outpatient Medications   Medication Sig   ??? clobetasoL (TEMOVATE) 0.05 % topical cream Apply  to affected area two (2) times a day.   ??? Insulin Needles, Disposable, 31 gauge x 5/16" ndle Administer NOvolog 10 uniys day before each meal   ??? insulin aspart U-100 (NOVOLOG FLEXPEN U-100 INSULIN) 100 unit/mL (3 mL) inpn adminitser 10 units subcut three times daily before each meal   ??? escitalopram oxalate (LEXAPRO) 10 mg tablet Take 1 Tab by mouth daily.   ??? LORazepam (ATIVAN) 0.5 mg tablet Take 1 Tab by mouth nightly as needed for Anxiety. Max Daily Amount: 0.5 mg.   ??? ammonium lactate (AMLACTIN) 12 % topical cream Apply  to affected area two (2) times a day. rub in to affected area well   ??? insulin glargine (LANTUS SOLOSTAR U-100 INSULIN) 100 unit/mL (3 mL) inpn 60 Units by SubCUTAneous route nightly.   ??? aspirin delayed-release 81 mg tablet Take 1 Tab by mouth daily.   ??? lurasidone (LATUDA) 20 mg tab tablet Take  by mouth.   ??? lidocaine (LIDODERM) 5 % Apply to the back once daily    ??? albuterol (PROVENTIL HFA) 90 mcg/actuation inhaler Take 1 Puff by inhalation every six (6) hours as needed for Wheezing.   ??? albuterol (PROVENTIL VENTOLIN) 2.5 mg /3 mL (0.083 %) nebu Take 3 mL by inhalation every six (6) hours as needed for Wheezing.   ??? Blood-Glucose Meter (ONETOUCH ULTRA2 METER) monitoring kit Use as directed.   ??? metoprolol tartrate (LOPRESSOR) 50 mg tablet TAKE ONE TABLET BY MOUTH TWICE A DAY   ??? montelukast (SINGULAIR) 10 mg tablet Take 1 Tab by mouth daily.   ??? naloxone (NARCAN) 4 mg/actuation nasal spray Use 1 spray intranasally, then discard. Repeat with new spray every 2 min as needed for opioid overdose symptoms, alternating nostrils.   ??? diphenhydrAMINE (BENADRYL ALLERGY) 25 mg tablet Take 1 Tab by mouth nightly as needed for Sleep.   ??? lisinopril (PRINIVIL, ZESTRIL) 20 mg tablet Take 1 Tab by mouth daily.   ??? carisoprodol (SOMA) 350 mg tablet    ??? dextroamphetamine-amphetamine (ADDERALL) 20 mg tablet  Take 20 mg by mouth two (2) times a day.   ??? glucose blood VI test strips (TRUE METRIX GLUCOSE TEST STRIP) strip Use as instructed   ??? lancets (TRUEPLUS LANCETS) 28 gauge misc Use as directed   ??? rosuvastatin (CRESTOR) 10 mg tablet Take 10 mg by mouth.   ??? traZODone (DESYREL) 100 mg tablet Take 100 mg by mouth nightly as needed.     No current facility-administered medications for this visit.      Health Maintenance   Topic Date Due   ??? Hepatitis C Screening  February 28, 1965   ??? Foot Exam Q1  09/13/1973   ??? Eye Exam Retinal or Dilated  09/13/1973   ??? DTaP/Tdap/Td series (1 - Tdap) 09/14/1974   ??? Shingrix Vaccine Age 31> (1 of 2) 09/13/2013   ??? FOBT Q1Y Age 31-75  09/13/2013   ??? MICROALBUMIN Q1  04/06/2018   ??? Influenza Age 22 to Adult  03/24/2018 (Originally 08/12/2017)   ??? Lipid Screen  04/06/2018   ??? A1C test (Diabetic or Prediabetic)  04/25/2018   ??? PAP AKA CERVICAL CYTOLOGY  03/06/2019   ??? Breast Cancer Screen Mammogram  04/23/2019   ??? Pneumococcal 0-64 years  Completed      Immunization History   Administered Date(s) Administered   ??? Pneumococcal Polysaccharide (PPSV-23) 04/26/2017     No LMP recorded. Patient has had a hysterectomy.        Allergies and Intolerances:   Allergies   Allergen Reactions   ??? Pyridium [Phenazopyridine] Itching       Family History:   No family history on file.    Social History:   She  reports that she has been smoking. She has a 10.00 pack-year smoking history. She has quit using smokeless tobacco.  She  reports previous alcohol use.            Review of Systems:   General: negative for - chills, fatigue, fever, weight change  Psych: negative for - anxiety, depression, irritability or mood swings  ENT: negative for - headaches, hearing change, nasal congestion, oral lesions, sneezing or sore throat  Heme/ Lymph: negative for - bleeding problems, bruising, pallor or swollen lymph nodes  Endo: negative for - hot flashes, polydipsia/polyuria or temperature intolerance  Resp: negative for - cough, shortness of breath or wheezing  CV: negative for - chest pain, edema or palpitations  GI: negative for - abdominal pain, change in bowel habits, constipation, diarrhea or nausea/vomiting  GU: negative for - dysuria, hematuria, incontinence, pelvic pain or vulvar/vaginal symptoms  MSK: negative for - joint pain, joint swelling or muscle pain  Neuro: negative for - confusion, headaches, seizures or weakness  Derm: negative for - dry skin, hair changes, rash or skin lesion changes          Physical:   Vitals:   Vitals:    03/08/18 0803   BP: 132/80   Pulse: (!) 58   Resp: 18   Temp: 98.7 ??F (37.1 ??C)   TempSrc: Oral   SpO2: 99%   Weight: 182 lb 3.2 oz (82.6 kg)   Height: '5\' 8"'$  (1.727 m)           Exam:   HEENT- atraumatic,normocephalic, awake, oriented, well nourished  Neck - supple,pos  enlarged lymph nodes rt posterior upper cervical LN,tender to touch.  Chest- CTA, no rhonchi, no crackles, eczematous  lesion rt  Chest wall below the rt breast.,    Heart- rrr, no murmurs / gallop/rub  Abdomen- soft,BS+,NT, no hepatosplenomegaly  Ext - no c/c/edema   Neuro- no focal deficits.Power 5/5 all extremities  Skin - warm,pos dryskin          Review of Data:   LABS:   Lab Results   Component Value Date/Time    WBC 4.5 01/14/2018 11:28 AM    HGB 14.9 01/14/2018 11:28 AM    HCT 43.5 01/14/2018 11:28 AM    PLATELET 239 01/14/2018 11:28 AM     Lab Results   Component Value Date/Time    Sodium 137 01/14/2018 11:28 AM    Potassium 3.8 01/14/2018 11:28 AM    Chloride 107 01/14/2018 11:28 AM    CO2 25 01/14/2018 11:28 AM    Glucose 309 (H) 01/14/2018 11:28 AM    BUN 13 01/14/2018 11:28 AM    Creatinine 0.86 01/14/2018 11:28 AM     Lab Results   Component Value Date/Time    Cholesterol, total 160 04/05/2017 12:32 PM    HDL Cholesterol 61 04/05/2017 12:32 PM    LDL, calculated 77 04/05/2017 12:32 PM    Triglyceride 112 04/05/2017 12:32 PM     No results found for: GPT        Impression / Plan:        ICD-10-CM ICD-9-CM    1. Cervical lymphadenopathy R59.0 785.6 REFERRAL TO ENT-OTOLARYNGOLOGY      CT NECK SOFT TISSUE W WO CONT   2. Smoker F17.200 305.1 CT NECK SOFT TISSUE W WO CONT   3. Neck pain M54.2 723.1 REFERRAL TO ENT-OTOLARYNGOLOGY      CT NECK SOFT TISSUE W WO CONT   4. Nonintractable headache, unspecified chronicity pattern, unspecified headache type R51 784.0 REFERRAL TO ENT-OTOLARYNGOLOGY      CT NECK SOFT TISSUE W WO CONT   5. Eczema, unspecified type L30.9 692.9 clobetasoL (TEMOVATE) 0.05 % topical cream   6. Anxiety F41.9 300.00 escitalopram oxalate (LEXAPRO) 10 mg tablet      LORazepam (ATIVAN) 0.5 mg tablet   7. Dry skin L85.3 701.1 ammonium lactate (AMLACTIN) 12 % topical cream   8. DM type 2, goal HbA1c < 7% (HCC) E11.9 250.00 Insulin Needles, Disposable, 31 gauge x 5/16" ndle      insulin aspart U-100 (NOVOLOG FLEXPEN U-100 INSULIN) 100 unit/mL (3 mL) inpn      insulin glargine (LANTUS SOLOSTAR U-100 INSULIN) 100 unit/mL (3 mL) inpn       aspirin delayed-release 81 mg tablet         Explained to patient risk benefits of the medications.Advised patient to stop meds if having any side effects.Pt verbalized understanding of the instructions.    I have discussed the diagnosis with the patient and the intended plan as seen in the above orders.  The patient has received an after-visit summary and questions were answered concerning future plans.  I have discussed medication side effects and warnings with the patient as well. I have reviewed the plan of care with the patient, accepted their input and they are in agreement with the treatment goals.     Reviewed plan of care. Patient has provided input and agrees with goals.        Elnora Morrison, MD

## 2018-04-19 NOTE — Telephone Encounter (Signed)
Please advise

## 2018-04-20 NOTE — Telephone Encounter (Signed)
Notified the pharmacy patient to have rx refilled with her psych doctor.

## 2018-04-20 NOTE — Telephone Encounter (Signed)
Pt gets it from psych. I have prescribed it.Pt nees to call psysch

## 2018-04-22 ENCOUNTER — Encounter: Attending: Internal Medicine | Primary: Internal Medicine

## 2018-04-25 ENCOUNTER — Encounter

## 2018-04-25 MED ORDER — LORAZEPAM 0.5 MG TAB
0.5 mg | ORAL_TABLET | ORAL | 0 refills | Status: DC
Start: 2018-04-25 — End: 2018-05-19

## 2018-04-25 NOTE — Telephone Encounter (Signed)
I have refilled the med. In future pt should get it from her Psychiatrist

## 2018-05-18 ENCOUNTER — Encounter

## 2018-05-19 MED ORDER — LORAZEPAM 0.5 MG TAB
0.5 mg | ORAL_TABLET | ORAL | 0 refills | Status: DC
Start: 2018-05-19 — End: 2018-08-30

## 2018-05-24 NOTE — Telephone Encounter (Signed)
Message has been conveyed to [facility] with Central Scheduling and patient to ENT per Dr Danella Penton.

## 2018-05-24 NOTE — Telephone Encounter (Signed)
Please advise

## 2018-05-24 NOTE — Telephone Encounter (Signed)
The insurance called for Prior auth for CT scan. Requested a call back at [phone] option 4, case number [id number]

## 2018-05-24 NOTE — Telephone Encounter (Signed)
Insurance denied ct neck soft tissue with and without contrast, a peer to peer can be done.    Phone: [phone]  Option 4  Case: [id number]

## 2018-05-24 NOTE — Telephone Encounter (Signed)
The Ct scan was ordered back in December 2019.  I had also referred pt to ENT at the time.  If pt feels that she still has swollen lymph nodes in the neck, she should make appt with ENT if not already done.

## 2018-05-25 NOTE — Telephone Encounter (Signed)
I do not do those. Someone would need to contact central scheduling and they process them

## 2018-05-27 ENCOUNTER — Ambulatory Visit: Payer: MEDICAID | Primary: Internal Medicine

## 2018-06-07 ENCOUNTER — Encounter: Attending: Internal Medicine | Primary: Internal Medicine

## 2018-06-07 ENCOUNTER — Inpatient Hospital Stay
Admit: 2018-06-07 | Discharge: 2018-06-07 | Disposition: A | Discharge status: Home / Self Care | Payer: MEDICAID | Attending: Student in an Organized Health Care Education/Training Program

## 2018-06-07 DIAGNOSIS — R51 Headache: Secondary | ICD-10-CM

## 2018-06-07 LAB — COMPREHENSIVE METABOLIC PANEL
ALT: 47 U/L (ref 12–78)
AST: 29 U/L (ref 15–37)
Albumin/Globulin Ratio: 1.1 (ref 1.1–2.2)
Albumin: 3.7 g/dL (ref 3.5–5.0)
Alkaline Phosphatase: 86 U/L (ref 45–117)
Anion Gap: 11 mmol/L (ref 5–15)
BUN: 12 MG/DL (ref 6–20)
Bun/Cre Ratio: 13 (ref 12–20)
CO2: 26 mmol/L (ref 21–32)
Calcium: 8.6 MG/DL (ref 8.5–10.1)
Chloride: 106 mmol/L (ref 97–108)
Creatinine: 0.91 MG/DL (ref 0.55–1.02)
EGFR IF NonAfrican American: >60 mL/min/{1.73_m2} (ref >60)
GFR African American: >60 mL/min/{1.73_m2} (ref >60)
Globulin: 3.3 g/dL (ref 2.0–4.0)
Glucose: 111 mg/dL — ABNORMAL HIGH (ref 65–100)
Potassium: 3.9 mmol/L (ref 3.5–5.1)
Sodium: 143 mmol/L (ref 136–145)
Total Bilirubin: 0.5 MG/DL (ref 0.2–1.0)
Total Protein: 7 g/dL (ref 6.4–8.2)

## 2018-06-07 LAB — CBC WITH AUTO DIFFERENTIAL
Basophils %: 1 % (ref 0–1)
Basophils Absolute: 0 10*3/uL (ref 0.0–0.1)
Eosinophils %: 4 % (ref 0–7)
Eosinophils Absolute: 0.2 10*3/uL (ref 0.0–0.4)
Granulocyte Absolute Count: 0 10*3/uL (ref 0.00–0.04)
Hematocrit: 39.5 % (ref 35.0–47.0)
Hemoglobin: 13.3 g/dL (ref 11.5–16.0)
Immature Granulocytes: 0 % (ref 0.0–0.5)
Lymphocytes %: 38 % (ref 12–49)
Lymphocytes Absolute: 1.9 10*3/uL (ref 0.8–3.5)
MCH: 30 PG (ref 26.0–34.0)
MCHC: 33.7 g/dL (ref 30.0–36.5)
MCV: 89 FL (ref 80.0–99.0)
MPV: 8.8 FL — ABNORMAL LOW (ref 8.9–12.9)
Monocytes %: 7 % (ref 5–13)
Monocytes Absolute: 0.3 10*3/uL (ref 0.0–1.0)
NRBC Absolute: 0 10*3/uL (ref 0.00–0.01)
Neutrophils %: 50 % (ref 32–75)
Neutrophils Absolute: 2.5 10*3/uL (ref 1.8–8.0)
Nucleated RBCs: 0 PER 100 WBC
Platelets: 259 10*3/uL (ref 150–400)
RBC: 4.44 M/uL (ref 3.80–5.20)
RDW: 12.8 % (ref 11.5–14.5)
WBC: 4.9 10*3/uL (ref 3.6–11.0)

## 2018-06-07 LAB — URINALYSIS WITH MICROSCOPIC
BACTERIA, URINE: NEGATIVE /hpf
Bilirubin, Urine: NEGATIVE
Blood, Urine: NEGATIVE
Glucose, Ur: NEGATIVE mg/dL
Ketones, Urine: NEGATIVE mg/dL
Leukocyte Esterase, Urine: NEGATIVE
Nitrite, Urine: NEGATIVE
Protein, UA: NEGATIVE mg/dL
Specific Gravity, UA: 1.025 (ref 1.003–1.030)
Urobilinogen, UA, POCT: 0.2 EU/dL (ref 0.2–1.0)
pH, UA: 5.5 (ref 5.0–8.0)

## 2018-06-07 LAB — POCT GLUCOSE: POC Glucose: 123 mg/dL — ABNORMAL HIGH (ref 65–100)

## 2018-06-07 LAB — CBC WITH AUTOMATED DIFF
ABS. BASOPHILS: 0 10*3/uL (ref 0.0–0.1)
ABS. EOSINOPHILS: 0.2 10*3/uL (ref 0.0–0.4)
ABS. IMM. GRANS.: 0 10*3/uL (ref 0.00–0.04)
ABS. LYMPHOCYTES: 1.9 10*3/uL (ref 0.8–3.5)
ABS. MONOCYTES: 0.3 10*3/uL (ref 0.0–1.0)
ABS. NEUTROPHILS: 2.5 10*3/uL (ref 1.8–8.0)
ABSOLUTE NRBC: 0 10*3/uL (ref 0.00–0.01)
BASOPHILS: 1 % (ref 0–1)
EOSINOPHILS: 4 % (ref 0–7)
HCT: 39.5 % (ref 35.0–47.0)
HGB: 13.3 g/dL (ref 11.5–16.0)
IMMATURE GRANULOCYTES: 0 % (ref 0.0–0.5)
LYMPHOCYTES: 38 % (ref 12–49)
MCH: 30 PG (ref 26.0–34.0)
MCHC: 33.7 g/dL (ref 30.0–36.5)
MCV: 89 FL (ref 80.0–99.0)
MONOCYTES: 7 % (ref 5–13)
MPV: 8.8 FL — ABNORMAL LOW (ref 8.9–12.9)
NEUTROPHILS: 50 % (ref 32–75)
NRBC: 0 PER 100 WBC
PLATELET: 259 10*3/uL (ref 150–400)
RBC: 4.44 M/uL (ref 3.80–5.20)
RDW: 12.8 % (ref 11.5–14.5)
WBC: 4.9 10*3/uL (ref 3.6–11.0)

## 2018-06-07 LAB — URINALYSIS W/MICROSCOPIC
Bacteria: NEGATIVE /hpf
Bilirubin: NEGATIVE
Blood: NEGATIVE
Glucose: NEGATIVE mg/dL
Ketone: NEGATIVE mg/dL
Leukocyte Esterase: NEGATIVE
Nitrites: NEGATIVE
Protein: NEGATIVE mg/dL
Specific gravity: 1.025 (ref 1.003–1.030)
Urobilinogen: 0.2 EU/dL (ref 0.2–1.0)
pH (UA): 5.5 (ref 5.0–8.0)

## 2018-06-07 LAB — METABOLIC PANEL, COMPREHENSIVE
A-G Ratio: 1.1 (ref 1.1–2.2)
ALT (SGPT): 47 U/L (ref 12–78)
AST (SGOT): 29 U/L (ref 15–37)
Albumin: 3.7 g/dL (ref 3.5–5.0)
Alk. phosphatase: 86 U/L (ref 45–117)
Anion gap: 11 mmol/L (ref 5–15)
BUN/Creatinine ratio: 13 (ref 12–20)
BUN: 12 MG/DL (ref 6–20)
Bilirubin, total: 0.5 MG/DL (ref 0.2–1.0)
CO2: 26 mmol/L (ref 21–32)
Calcium: 8.6 MG/DL (ref 8.5–10.1)
Chloride: 106 mmol/L (ref 97–108)
Creatinine: 0.91 MG/DL (ref 0.55–1.02)
GFR est AA: >60 mL/min/{1.73_m2} (ref >60)
GFR est non-AA: >60 mL/min/{1.73_m2} (ref >60)
Globulin: 3.3 g/dL (ref 2.0–4.0)
Glucose: 111 mg/dL — ABNORMAL HIGH (ref 65–100)
Potassium: 3.9 mmol/L (ref 3.5–5.1)
Protein, total: 7 g/dL (ref 6.4–8.2)
Sodium: 143 mmol/L (ref 136–145)

## 2018-06-07 LAB — GLUCOSE, POC: Glucose (POC): 123 mg/dL — ABNORMAL HIGH (ref 65–100)

## 2018-06-07 LAB — URINE CULTURE HOLD SAMPLE

## 2018-06-07 MED ORDER — SODIUM CHLORIDE 0.9% BOLUS IV
0.9 % | Freq: Once | INTRAVENOUS | Status: AC
Start: 2018-06-07 — End: 2018-06-07
  Administered 2018-06-07: 15:00:00 via INTRAVENOUS

## 2018-06-07 MED ORDER — PROCHLORPERAZINE EDISYLATE 5 MG/ML INJECTION
5 mg/mL | INTRAMUSCULAR | Status: AC
Start: 2018-06-07 — End: 2018-06-07
  Administered 2018-06-07: 15:00:00 via INTRAVENOUS

## 2018-06-07 MED ORDER — KETOROLAC TROMETHAMINE 30 MG/ML INJECTION
30 mg/mL (1 mL) | Freq: Once | INTRAMUSCULAR | Status: AC
Start: 2018-06-07 — End: 2018-06-07
  Administered 2018-06-07: 14:00:00 via INTRAVENOUS

## 2018-06-07 MED ORDER — SODIUM CHLORIDE 0.9% BOLUS IV
0.9 % | Freq: Once | INTRAVENOUS | Status: AC
Start: 2018-06-07 — End: 2018-06-07
  Administered 2018-06-07: 14:00:00 via INTRAVENOUS

## 2018-06-07 MED FILL — KETOROLAC TROMETHAMINE 30 MG/ML INJECTION: 30 mg/mL (1 mL) | INTRAMUSCULAR | Qty: 1

## 2018-06-07 MED FILL — SODIUM CHLORIDE 0.9 % IV: INTRAVENOUS | Qty: 500

## 2018-06-07 MED FILL — SODIUM CHLORIDE 0.9 % IV: INTRAVENOUS | Qty: 1000

## 2018-06-07 MED FILL — PROCHLORPERAZINE EDISYLATE 5 MG/ML INJECTION: 5 mg/mL | INTRAMUSCULAR | Qty: 2

## 2018-06-07 NOTE — ED Provider Notes (Signed)
Headache    This is a recurrent problem. The current episode started 2 days ago. The problem occurs constantly. The problem has not changed since onset.The headache is aggravated by an unknown factor. The pain is located in the right unilateral region. The quality of the pain is described as dull and throbbing. The pain is moderate. Associated symptoms include malaise/fatigue and nausea. Pertinent negatives include no fever, no shortness of breath, no weakness, no dizziness and no vomiting. Associated symptoms comments: +cough, + chills. Treatments tried: Took a soma pill this morning to try to help with muscle tightness. The treatment provided no relief.        Past Medical History:   Diagnosis Date   ??? Asthma    ??? Depression    ??? Diabetes (HCC)     Type 2 DM   ??? Hypercholesterolemia    ??? Hypertension        Past Surgical History:   Procedure Laterality Date   ??? CARDIAC SURG PROCEDURE UNLIST  2019    heart catherization    ??? HX ACL RECONSTRUCTION      right knee   ??? HX APPENDECTOMY     ??? HX ARTERIAL BYPASS      left main artery bypass   ??? HX HYSTERECTOMY     ??? HX WISDOM TEETH EXTRACTION           History reviewed. No pertinent family history.    Social History     Socioeconomic History   ??? Marital status: SINGLE     Spouse name: Not on file   ??? Number of children: Not on file   ??? Years of education: Not on file   ??? Highest education level: Not on file   Occupational History   ??? Not on file   Social Needs   ??? Financial resource strain: Not on file   ??? Food insecurity     Worry: Not on file     Inability: Not on file   ??? Transportation needs     Medical: Not on file     Non-medical: Not on file   Tobacco Use   ??? Smoking status: Current Every Day Smoker     Packs/day: 0.25     Years: 40.00     Pack years: 10.00   ??? Smokeless tobacco: Former Neurosurgeon   ??? Tobacco comment: 4 cigs a day   Substance and Sexual Activity   ??? Alcohol use: Not Currently   ??? Drug use: Never   ??? Sexual activity: Not on file   Lifestyle   ???  Physical activity     Days per week: Not on file     Minutes per session: Not on file   ??? Stress: Not on file   Relationships   ??? Social Wellsite geologist on phone: Not on file     Gets together: Not on file     Attends religious service: Not on file     Active member of club or organization: Not on file     Attends meetings of clubs or organizations: Not on file     Relationship status: Not on file   ??? Intimate partner violence     Fear of current or ex partner: Not on file     Emotionally abused: Not on file     Physically abused: Not on file     Forced sexual activity: Not on file   Other Topics Concern   ???  Not on file   Social History Narrative   ??? Not on file         ALLERGIES: Pyridium [phenazopyridine]    Review of Systems   Constitutional: Positive for chills, fatigue and malaise/fatigue. Negative for fever.   Respiratory: Positive for cough. Negative for shortness of breath.    Cardiovascular: Negative for chest pain.   Gastrointestinal: Positive for nausea. Negative for abdominal pain and vomiting.   Genitourinary: Negative for dysuria.   Musculoskeletal: Positive for back pain and neck pain.   Neurological: Positive for headaches. Negative for dizziness, syncope, facial asymmetry, speech difficulty and weakness.   Psychiatric/Behavioral: Negative for confusion.   All other systems reviewed and are negative.      Vitals:    06/07/18 0953 06/07/18 1003   BP: 157/86    Pulse: (!) 59    Resp: 18    Temp: 98.5 ??F (36.9 ??C)    SpO2: 99% 99%   Weight: 83.2 kg (183 lb 6.8 oz)    Height: 5\' 6"  (1.676 m)             Physical Exam  Vitals signs reviewed.   Constitutional:       General: She is not in acute distress.     Appearance: She is well-developed.   HENT:      Head: Normocephalic and atraumatic.   Eyes:      Conjunctiva/sclera: Conjunctivae normal.   Neck:      Musculoskeletal: Normal range of motion and neck supple. No neck rigidity.   Cardiovascular:      Rate and Rhythm: Normal rate and regular rhythm.       Heart sounds: Normal heart sounds.   Pulmonary:      Effort: Pulmonary effort is normal. No respiratory distress.      Breath sounds: Normal breath sounds.   Abdominal:      Palpations: Abdomen is soft.      Tenderness: There is no abdominal tenderness. There is no guarding.   Musculoskeletal: Normal range of motion.   Skin:     General: Skin is warm and dry.   Neurological:      General: No focal deficit present.      Mental Status: She is alert and oriented to person, place, and time.      Cranial Nerves: No dysarthria or facial asymmetry.      Sensory: Sensation is intact.      Motor: Motor function is intact.          MDM       Procedures      Assessment and plan: This is a 53 year old female with multiple comorbidities who presents with 2 days of right-sided headache.  Gradual onset.  No red flags.  Patient not checking blood sugars regularly.  Differential diagnosis includes tension headache, migraine, metabolic abnormality, dehydration, viral illness.  No findings clinically concerning for stroke, subarachnoid hemorrhage, meningitis, giant cell arteritis.  Plan for labs, pain control, and IV hydration.  Can likely be discharged home with outpatient follow-up if results are reassuring and clinical course is stable.    12:29 PM  The patient presented to the emergency department with a headache. The patient is now resting comfortably and feels better, is alert, talkative, interactive and in no distress. The patient appears well and is able to tolerate PO fluids. The repeat examination is unremarkable and benign. The patient is neurologically intact, has a normal mental status, and is ambulatory in the ED. The  history, exam, diagnostic testing (if any) and the patient's current condition do not suggest meningitis, stroke, sepsis, subarachnoid hemorrhage, intracranial bleeding, encephalitis, temporal arteritis or other significant pathology to warrant further testing, continued ED treatment, admission,  neurological consultation, or other specialist evaluation at this point. The vital signs have been stable. The patient's condition is stable and appropriate for discharge. The patient will pursue further outpatient evaluation with the primary care physician or other designated or consulting physician as indicated in the discharge instructions.

## 2018-06-07 NOTE — ED Notes (Signed)
Headache x2 days.  Starting in right side home, and going down right side of body.  Pt has had a cough that is chronic and she was unable to sleep last night. Fever starting early this morning (99.30F).  Pt has not taken tylenol or motrin.  Pt did take a Soma pill.

## 2018-06-07 NOTE — ED Notes (Signed)
Report to Becky and Kelly RN's.  Pt stable at time of care transfer.

## 2018-06-07 NOTE — ED Notes (Signed)
Patient verbalizes understanding of discharge instructions. Ambulatory and in no acute distress at discharge.

## 2018-06-07 NOTE — ED Triage Notes (Signed)
Headache x2 days.  Starting in right side home, and going down right side of body.  Pt has had a cough that is chronic and she was unable to sleep last night. Fever starting early this morning (99.4F).  Pt has not taken tylenol or motrin.  Pt did take a Soma pill.

## 2018-06-07 NOTE — ED Provider Notes (Signed)
Headache    This is a recurrent problem. The current episode started 2 days ago. The problem occurs constantly. The problem has not changed since onset.The headache is aggravated by an unknown factor. The pain is located in the right unilateral region. The quality of the pain is described as dull and throbbing. The pain is moderate. Associated symptoms include malaise/fatigue and nausea. Pertinent negatives include no fever, no shortness of breath, no weakness, no dizziness and no vomiting. Associated symptoms comments: +cough, + chills. Treatments tried: Took a soma pill this morning to try to help with muscle tightness. The treatment provided no relief.        Past Medical History:   Diagnosis Date   ??? Asthma    ??? Depression    ??? Diabetes (HCC)     Type 2 DM   ??? Hypercholesterolemia    ??? Hypertension        Past Surgical History:   Procedure Laterality Date   ??? CARDIAC SURG PROCEDURE UNLIST  2019    heart catherization    ??? HX ACL RECONSTRUCTION      right knee   ??? HX APPENDECTOMY     ??? HX ARTERIAL BYPASS      left main artery bypass   ??? HX HYSTERECTOMY     ??? HX WISDOM TEETH EXTRACTION           History reviewed. No pertinent family history.    Social History     Socioeconomic History   ??? Marital status: SINGLE     Spouse name: Not on file   ??? Number of children: Not on file   ??? Years of education: Not on file   ??? Highest education level: Not on file   Occupational History   ??? Not on file   Social Needs   ??? Financial resource strain: Not on file   ??? Food insecurity     Worry: Not on file     Inability: Not on file   ??? Transportation needs     Medical: Not on file     Non-medical: Not on file   Tobacco Use   ??? Smoking status: Current Every Day Smoker     Packs/day: 0.25     Years: 40.00     Pack years: 10.00   ??? Smokeless tobacco: Former Neurosurgeon   ??? Tobacco comment: 4 cigs a day   Substance and Sexual Activity   ??? Alcohol use: Not Currently   ??? Drug use: Never   ??? Sexual activity: Not on file   Lifestyle    ??? Physical activity     Days per week: Not on file     Minutes per session: Not on file   ??? Stress: Not on file   Relationships   ??? Social Wellsite geologist on phone: Not on file     Gets together: Not on file     Attends religious service: Not on file     Active member of club or organization: Not on file     Attends meetings of clubs or organizations: Not on file     Relationship status: Not on file   ??? Intimate partner violence     Fear of current or ex partner: Not on file     Emotionally abused: Not on file     Physically abused: Not on file     Forced sexual activity: Not on file   Other Topics Concern   ???  Not on file   Social History Narrative   ??? Not on file         ALLERGIES: Pyridium [phenazopyridine]    Review of Systems   Constitutional: Positive for chills, fatigue and malaise/fatigue. Negative for fever.   Respiratory: Positive for cough. Negative for shortness of breath.    Cardiovascular: Negative for chest pain.   Gastrointestinal: Positive for nausea. Negative for abdominal pain and vomiting.   Genitourinary: Negative for dysuria.   Musculoskeletal: Positive for back pain and neck pain.   Neurological: Positive for headaches. Negative for dizziness, syncope, facial asymmetry, speech difficulty and weakness.   Psychiatric/Behavioral: Negative for confusion.   All other systems reviewed and are negative.      Vitals:    06/07/18 0953 06/07/18 1003   BP: 157/86    Pulse: (!) 59    Resp: 18    Temp: 98.5 ??F (36.9 ??C)    SpO2: 99% 99%   Weight: 83.2 kg (183 lb 6.8 oz)    Height: 5\' 6"  (1.676 m)             Physical Exam  Vitals signs reviewed.   Constitutional:       General: She is not in acute distress.     Appearance: She is well-developed.   HENT:      Head: Normocephalic and atraumatic.   Eyes:      Conjunctiva/sclera: Conjunctivae normal.   Neck:      Musculoskeletal: Normal range of motion and neck supple. No neck rigidity.   Cardiovascular:       Rate and Rhythm: Normal rate and regular rhythm.      Heart sounds: Normal heart sounds.   Pulmonary:      Effort: Pulmonary effort is normal. No respiratory distress.      Breath sounds: Normal breath sounds.   Abdominal:      Palpations: Abdomen is soft.      Tenderness: There is no abdominal tenderness. There is no guarding.   Musculoskeletal: Normal range of motion.   Skin:     General: Skin is warm and dry.   Neurological:      General: No focal deficit present.      Mental Status: She is alert and oriented to person, place, and time.      Cranial Nerves: No dysarthria or facial asymmetry.      Sensory: Sensation is intact.      Motor: Motor function is intact.          MDM       Procedures      Assessment and plan: This is a 53 year old female with multiple comorbidities who presents with 2 days of right-sided headache.  Gradual onset.  No red flags.  Patient not checking blood sugars regularly.  Differential diagnosis includes tension headache, migraine, metabolic abnormality, dehydration, viral illness.  No findings clinically concerning for stroke, subarachnoid hemorrhage, meningitis, giant cell arteritis.  Plan for labs, pain control, and IV hydration.  Can likely be discharged home with outpatient follow-up if results are reassuring and clinical course is stable.    12:29 PM  The patient presented to the emergency department with a headache. The patient is now resting comfortably and feels better, is alert, talkative, interactive and in no distress. The patient appears well and is able to tolerate PO fluids. The repeat examination is unremarkable and benign. The patient is neurologically intact, has a normal mental status, and is ambulatory in the ED. The  history, exam, diagnostic testing (if any) and the patient's current condition do not suggest meningitis, stroke, sepsis, subarachnoid hemorrhage, intracranial bleeding, encephalitis, temporal  arteritis or other significant pathology to warrant further testing, continued ED treatment, admission, neurological consultation, or other specialist evaluation at this point. The vital signs have been stable. The patient's condition is stable and appropriate for discharge. The patient will pursue further outpatient evaluation with the primary care physician or other designated or consulting physician as indicated in the discharge instructions.

## 2018-06-08 NOTE — Progress Notes (Signed)
Patient contacted regarding recent discharge and COVID-19 risk. Discussed COVID-19 related testing which was not done at this time. Test results were not done. Patient informed of results, if available? n/a    Care Transition Nurse/ Ambulatory Care Manager contacted the patient by telephone to perform post discharge assessment. Verified name and DOB with patient as identifiers.     Patient has following risk factors of: diabetes. CTN/ACM reviewed discharge instructions, medical action plan and red flags related to discharge diagnosis. Reviewed and educated them on any new and changed medications related to discharge diagnosis.  Advised obtaining a 90-day supply of all daily and as-needed medications.     Education provided regarding infection prevention, and signs and symptoms of COVID-19 and when to seek medical attention with patient who verbalized understanding. Discussed exposure protocols and quarantine from [facility] Guidelines "Are you at higher risk for severe illness 2019" and given an opportunity for questions and concerns. The patient agrees to contact the COVID-19 hotline (801)518-7465 or PCP office for questions related to their healthcare. CTN/ACM provided contact information for future reference.    From CDC: Are you at higher risk for severe illness?    Cathy [name] your hands often.  Marland Kitchen Avoid close contact (6 feet, which is about two arm lengths) with people who are sick.  . Put distance between yourself and other people if COVID-19 is spreading in your community.  . Clean and disinfect frequently touched surfaces.  . Avoid all cruise travel and non-essential air travel.  . Call your healthcare professional if you have concerns about COVID-19 and your underlying condition or if you are sick.    For more information on steps you can take to protect yourself, see CDC's How to Protect Yourself      Patient/family/caregiver given information for GetWell Loop and agrees to enroll yes  Patient's preferred e-mail:   [email or web address]   Patient's preferred phone number: [phone]  Based on Loop alert triggers, patient will be contacted by nurse care manager for worsening symptoms.    Pt will be further monitored by COVID Loop Team based on severity of symptoms and risk factors.    Patient missed scheduled appointment yesterday, will call to reschedule today. Patient wants to reschedule.

## 2018-06-08 NOTE — Progress Notes (Signed)
Patient contacted regarding recent discharge and COVID-19 risk. Discussed COVID-19 related testing which was not done at this time. Test results were not done. Patient informed of results, if available? n/a    Care Transition Nurse/ Ambulatory Care Manager contacted the patient by telephone to perform post discharge assessment. Verified name and DOB with patient as identifiers.     Patient has following risk factors of: diabetes. CTN/ACM reviewed discharge instructions, medical action plan and red flags related to discharge diagnosis. Reviewed and educated them on any new and changed medications related to discharge diagnosis.  Advised obtaining a 90-day supply of all daily and as-needed medications.     Education provided regarding infection prevention, and signs and symptoms of COVID-19 and when to seek medical attention with patient who verbalized understanding. Discussed exposure protocols and quarantine from [facility] Guidelines ???Are you at higher risk for severe illness 2019??? and given an opportunity for questions and concerns. The patient agrees to contact the COVID-19 hotline [phone] or PCP office for questions related to their healthcare. CTN/ACM provided contact information for future reference.    From CDC: Are you at higher risk for severe illness?    ??? Wash your hands often.  ??? Avoid close contact (6 feet, which is about two arm lengths) with people who are sick.  ??? Put distance between yourself and other people if COVID-19 is spreading in your community.  ??? Clean and disinfect frequently touched surfaces.  ??? Avoid all cruise travel and non-essential air travel.  ??? Call your healthcare professional if you have concerns about COVID-19 and your underlying condition or if you are sick.    For more information on steps you can take to protect yourself, see CDC's How to Protect Yourself      Patient/family/caregiver given information for GetWell Loop and agrees to enroll yes   Patient's preferred e-mail:  [email or web address]   Patient's preferred phone number: [phone]  Based on Loop alert triggers, patient will be contacted by nurse care manager for worsening symptoms.    Pt will be further monitored by COVID Loop Team?? based on severity of symptoms and risk factors.    Patient missed scheduled appointment yesterday, will call to reschedule today. Patient wants to reschedule.

## 2018-08-22 ENCOUNTER — Encounter: Attending: Internal Medicine | Primary: Internal Medicine

## 2018-08-30 ENCOUNTER — Ambulatory Visit: Attending: Internal Medicine | Primary: Internal Medicine

## 2018-08-30 ENCOUNTER — Ambulatory Visit: Admit: 2018-08-30 | Discharge: 2018-08-30 | Payer: MEDICAID | Attending: Internal Medicine | Primary: Internal Medicine

## 2018-08-30 DIAGNOSIS — E119 Type 2 diabetes mellitus without complications: Secondary | ICD-10-CM

## 2018-08-30 LAB — CBC WITH AUTO DIFFERENTIAL
Basophils %: 1 % (ref 0–1)
Basophils Absolute: 0 10*3/uL (ref 0.0–0.1)
Eosinophils %: 3 % (ref 0–7)
Eosinophils Absolute: 0.1 10*3/uL (ref 0.0–0.4)
Granulocyte Absolute Count: 0 10*3/uL (ref 0.00–0.04)
Hematocrit: 46.4 % (ref 35.0–47.0)
Hemoglobin: 15.2 g/dL (ref 11.5–16.0)
Immature Granulocytes: 1 % — ABNORMAL HIGH (ref 0.0–0.5)
Lymphocytes %: 45 % (ref 12–49)
Lymphocytes Absolute: 2.2 10*3/uL (ref 0.8–3.5)
MCH: 29.4 PG (ref 26.0–34.0)
MCHC: 32.8 g/dL (ref 30.0–36.5)
MCV: 89.7 FL (ref 80.0–99.0)
MPV: 10.3 FL (ref 8.9–12.9)
Monocytes %: 6 % (ref 5–13)
Monocytes Absolute: 0.3 10*3/uL (ref 0.0–1.0)
NRBC Absolute: 0 10*3/uL (ref 0.00–0.01)
Neutrophils %: 44 % (ref 32–75)
Neutrophils Absolute: 2.1 10*3/uL (ref 1.8–8.0)
Nucleated RBCs: 0 PER 100 WBC
Platelets: 271 10*3/uL (ref 150–400)
RBC: 5.17 M/uL (ref 3.80–5.20)
RDW: 12.6 % (ref 11.5–14.5)
WBC: 4.9 10*3/uL (ref 3.6–11.0)

## 2018-08-30 LAB — CBC WITH AUTOMATED DIFF
ABS. BASOPHILS: 0 10*3/uL (ref 0.0–0.1)
ABS. EOSINOPHILS: 0.1 10*3/uL (ref 0.0–0.4)
ABS. IMM. GRANS.: 0 10*3/uL (ref 0.00–0.04)
ABS. LYMPHOCYTES: 2.2 10*3/uL (ref 0.8–3.5)
ABS. MONOCYTES: 0.3 10*3/uL (ref 0.0–1.0)
ABS. NEUTROPHILS: 2.1 10*3/uL (ref 1.8–8.0)
ABSOLUTE NRBC: 0 10*3/uL (ref 0.00–0.01)
BASOPHILS: 1 % (ref 0–1)
EOSINOPHILS: 3 % (ref 0–7)
HCT: 46.4 % (ref 35.0–47.0)
HGB: 15.2 g/dL (ref 11.5–16.0)
IMMATURE GRANULOCYTES: 1 % — ABNORMAL HIGH (ref 0.0–0.5)
LYMPHOCYTES: 45 % (ref 12–49)
MCH: 29.4 PG (ref 26.0–34.0)
MCHC: 32.8 g/dL (ref 30.0–36.5)
MCV: 89.7 FL (ref 80.0–99.0)
MONOCYTES: 6 % (ref 5–13)
MPV: 10.3 FL (ref 8.9–12.9)
NEUTROPHILS: 44 % (ref 32–75)
NRBC: 0 PER 100 WBC
PLATELET: 271 10*3/uL (ref 150–400)
RBC: 5.17 M/uL (ref 3.80–5.20)
RDW: 12.6 % (ref 11.5–14.5)
WBC: 4.9 10*3/uL (ref 3.6–11.0)

## 2018-08-30 LAB — AMB POC HEMOGLOBIN A1C: Hemoglobin A1c (POC): 7.8 %

## 2018-08-30 MED ORDER — INSULIN NEEDLES (DISPOSABLE) 31 X 5/16"
31 gauge x 5/16" | PACK | 11 refills | Status: DC
Start: 2018-08-30 — End: 2019-04-11

## 2018-08-30 MED ORDER — INSULIN ASPART 100 UNIT/ML (3 ML) SUB-Q PEN
100 unit/mL (3 mL) | PEN_INJECTOR | SUBCUTANEOUS | 3 refills | Status: DC
Start: 2018-08-30 — End: 2018-10-17

## 2018-08-30 MED ORDER — NEBULIZER & COMPRESSOR
0 refills | Status: DC | PRN
Start: 2018-08-30 — End: 2018-10-17

## 2018-08-30 MED ORDER — LISINOPRIL 20 MG TAB
20 mg | ORAL_TABLET | Freq: Every day | ORAL | 3 refills | Status: DC
Start: 2018-08-30 — End: 2019-04-11

## 2018-08-30 MED ORDER — LANTUS SOLOSTAR U-100 INSULIN 100 UNIT/ML (3 ML) SUBCUTANEOUS PEN
100 unit/mL (3 mL) | PEN_INJECTOR | Freq: Every evening | SUBCUTANEOUS | 3 refills | Status: DC
Start: 2018-08-30 — End: 2019-04-11

## 2018-08-30 MED ORDER — ROSUVASTATIN 10 MG TAB
10 mg | ORAL_TABLET | Freq: Every evening | ORAL | 2 refills | Status: DC
Start: 2018-08-30 — End: 2018-11-09

## 2018-08-30 MED ORDER — ALBUTEROL SULFATE HFA 90 MCG/ACTUATION AEROSOL INHALER
90 mcg/actuation | Freq: Four times a day (QID) | RESPIRATORY_TRACT | 3 refills | Status: AC | PRN
Start: 2018-08-30 — End: ?

## 2018-08-30 MED ORDER — LORAZEPAM 0.5 MG TAB
0.5 mg | ORAL_TABLET | Freq: Every evening | ORAL | 0 refills | Status: DC | PRN
Start: 2018-08-30 — End: 2018-10-17

## 2018-08-30 MED ORDER — METFORMIN 500 MG TAB
500 mg | ORAL_TABLET | Freq: Two times a day (BID) | ORAL | 4 refills | Status: AC
Start: 2018-08-30 — End: ?

## 2018-08-30 NOTE — Progress Notes (Signed)
Chief Complaint   Patient presents with   . Pre-op Exam     Patient presents today for a preoperative clearance for her anticipated surgery

## 2018-08-30 NOTE — Progress Notes (Signed)
Vitamin D levels are low . I am sending vitamin D to pharmacy.  Also chol is elevated. Pt should take crestor regularly.

## 2018-08-30 NOTE — Progress Notes (Signed)
Cathy [name] is a 53 y.o.  female and presents with     Chief Complaint   Patient presents with   ??? Pre-op Exam     Patient presents today for a preoperative clearance for her anticipated surgery   ??? Diabetes   ??? Anxiety   ??? Asthma   ??? Cholesterol Problem   ??? Hypertension         Pt is supposed to get rt shoulder surgery. However it has not been scheduled yet as her A1c was elevated.  Pt saw cardiologist and was told she has GERD and she was given some med recently.  She says she is taking insulin as directed.  She is not monitoring her sugars.  She wants to lose weight.  She is taking medicine for high blood pressure and cholesterol.  She says the tubing of her nebulizer machine got showed up by the dog.  She is requesting a new nebulizer machine.  She is also requesting Ativan as needed for anxiety.    She says she is taking Flexeril for right shoulder pain till she gets the surgery.  This is being prescribed by the orthopedic specialist.  She is worried about the lymph nodes in her neck.  Her insurance declined to authorize CT scan of her neck.  However she says she is getting Medicare soon and will not like to get it done.  She does see a psychiatrist.    Past Medical History:   Diagnosis Date   ??? Asthma    ??? Depression    ??? Diabetes ([place])     Type 2 DM   ??? Hypercholesterolemia    ??? Hypertension      Past Surgical History:   Procedure Laterality Date   ??? CARDIAC SURG PROCEDURE UNLIST  2019    heart catherization    ??? HX ACL RECONSTRUCTION      right knee   ??? HX APPENDECTOMY     ??? HX ARTERIAL BYPASS      left main artery bypass   ??? HX HYSTERECTOMY     ??? HX WISDOM TEETH EXTRACTION       Current Outpatient Medications   Medication Sig   ??? metFORMIN (GLUCOPHAGE) 500 mg tablet Take 1 Tab by mouth two (2) times daily (with meals).   ??? LORazepam (ATIVAN) 0.5 mg tablet Take 1 Tab by mouth nightly as needed for Anxiety. Max Daily Amount: 0.5 mg.   ??? Nebulizer & Compressor machine 1 Each by Does Not Apply route every  four (4) hours as needed for Wheezing or Shortness of Breath.   ??? Insulin Needles, Disposable, 31 gauge x 5/16" ndle Administer NOvolog 10 uniys day before each meal   ??? insulin aspart U-100 (NovoLOG Flexpen U-100 Insulin) 100 unit/mL (3 mL) inpn adminitser 10 units subcut three times daily before each meal   ??? insulin glargine (Lantus Solostar U-100 Insulin) 100 unit/mL (3 mL) inpn 60 Units by SubCUTAneous route nightly.   ??? albuterol (Proventil HFA) 90 mcg/actuation inhaler Take 1 Puff by inhalation every six (6) hours as needed for Wheezing.   ??? lisinopriL (PRINIVIL, ZESTRIL) 20 mg tablet Take 1 Tab by mouth daily.   ??? rosuvastatin (CRESTOR) 10 mg tablet Take 1 Tab by mouth nightly.   ??? clobetasoL (TEMOVATE) 0.05 % topical cream Apply  to affected area two (2) times a day.   ??? escitalopram oxalate (LEXAPRO) 10 mg tablet Take 1 Tab by mouth daily.   ??? ammonium  lactate (AMLACTIN) 12 % topical cream Apply  to affected area two (2) times a day. rub in to affected area well   ??? aspirin delayed-release 81 mg tablet Take 1 Tab by mouth daily.   ??? lurasidone (LATUDA) 20 mg tab tablet Take  by mouth.   ??? lidocaine (LIDODERM) 5 % Apply to the back once daily   ??? albuterol (PROVENTIL VENTOLIN) 2.5 mg /3 mL (0.083 %) nebu Take 3 mL by inhalation every six (6) hours as needed for Wheezing.   ??? Blood-Glucose Meter (ONETOUCH ULTRA2 METER) monitoring kit Use as directed.   ??? metoprolol tartrate (LOPRESSOR) 50 mg tablet TAKE ONE TABLET BY MOUTH TWICE A DAY   ??? montelukast (SINGULAIR) 10 mg tablet Take 1 Tab by mouth daily.   ??? naloxone (NARCAN) 4 mg/actuation nasal spray Use 1 spray intranasally, then discard. Repeat with new spray every 2 min as needed for opioid overdose symptoms, alternating nostrils.   ??? diphenhydrAMINE (BENADRYL ALLERGY) 25 mg tablet Take 1 Tab by mouth nightly as needed for Sleep.   ??? carisoprodol (SOMA) 350 mg tablet    ??? dextroamphetamine-amphetamine (ADDERALL) 20 mg tablet Take 20 mg by mouth two (2)  times a day.   ??? glucose blood VI test strips (TRUE METRIX GLUCOSE TEST STRIP) strip Use as instructed   ??? lancets (TRUEPLUS LANCETS) 28 gauge misc Use as directed   ??? traZODone (DESYREL) 100 mg tablet Take 100 mg by mouth nightly as needed.     No current facility-administered medications for this visit.      Health Maintenance   Topic Date Due   ??? Hepatitis C Screening  August 24, 1965   ??? Foot Exam Q1  09/13/1973   ??? Eye Exam Retinal or Dilated  09/13/1973   ??? DTaP/Tdap/Td series (1 - Tdap) 09/13/1984   ??? Shingrix Vaccine Age 75> (1 of 2) 09/13/2013   ??? FOBT Q1Y Age 75-75  09/13/2013   ??? MICROALBUMIN Q1  04/06/2018   ??? Lipid Screen  04/06/2018   ??? A1C test (Diabetic or Prediabetic)  04/25/2018   ??? Influenza Age 6 to Adult  08/13/2018   ??? PAP AKA CERVICAL CYTOLOGY  03/06/2019   ??? Breast Cancer Screen Mammogram  04/23/2019   ??? Pneumococcal 0-64 years  Completed     Immunization History   Administered Date(s) Administered   ??? Pneumococcal Polysaccharide (PPSV-23) 04/26/2017     No LMP recorded. Patient has had a hysterectomy.        Allergies and Intolerances:   Allergies   Allergen Reactions   ??? Pyridium [Phenazopyridine] Itching       Family History:   No family history on file.    Social History:   She  reports that she has been smoking. She has a 10.00 pack-year smoking history. She has quit using smokeless tobacco.  She  reports previous alcohol use.            Review of Systems:   General: negative for - chills, fatigue, fever, weight change  Psych: negative for - anxiety, depression, irritability or mood swings  ENT: negative for - headaches, hearing change, nasal congestion, oral lesions, sneezing or sore throat  Heme/ Lymph: negative for - bleeding problems, bruising, pallor or swollen lymph nodes  Endo: negative for - hot flashes, polydipsia/polyuria or temperature intolerance  Resp: negative for - cough, shortness of breath or wheezing  CV: negative for - chest pain, edema or palpitations  GI: negative for -  abdominal  pain, change in bowel habits, constipation, diarrhea or nausea/vomiting  GU: negative for - dysuria, hematuria, incontinence, pelvic pain or vulvar/vaginal symptoms  MSK: negative for - joint pain, joint swelling or muscle pain  Neuro: negative for - confusion, headaches, seizures or weakness  Derm: negative for - dry skin, hair changes, rash or skin lesion changes          Physical:   Vitals:   Vitals:    08/30/18 0928   BP: 107/58   Pulse: (!) 51   Resp: 16   Temp: 96.8 ??F (36 ??C)   TempSrc: Temporal   SpO2: 99%   Weight: 183 lb (83 kg)   Height: 5' 6" (1.676 m)           Exam:   HEENT- atraumatic,normocephalic, awake, oriented, well nourished  Neck - supple,no enlarged lymph nodes, no JVD, no thyromegaly  Chest- CTA, no rhonchi, no crackles  Heart- rrr, no murmurs / gallop/rub  Abdomen- soft,BS+,NT, no hepatosplenomegaly  Ext - no c/c/edema   Neuro- no focal deficits.Power 5/5 all extremities  Skin - warm,dry, no obvious rashes.          Review of Data:   LABS:   Lab Results   Component Value Date/Time    WBC 4.9 06/07/2018 10:17 AM    HGB 13.3 06/07/2018 10:17 AM    HCT 39.5 06/07/2018 10:17 AM    PLATELET 259 06/07/2018 10:17 AM     Lab Results   Component Value Date/Time    Sodium 143 06/07/2018 10:17 AM    Potassium 3.9 06/07/2018 10:17 AM    Chloride 106 06/07/2018 10:17 AM    CO2 26 06/07/2018 10:17 AM    Glucose 111 (H) 06/07/2018 10:17 AM    BUN 12 06/07/2018 10:17 AM    Creatinine 0.91 06/07/2018 10:17 AM     Lab Results   Component Value Date/Time    Cholesterol, total 160 04/05/2017 12:32 PM    HDL Cholesterol 61 04/05/2017 12:32 PM    LDL, calculated 77 04/05/2017 12:32 PM    Triglyceride 112 04/05/2017 12:32 PM     No components found for: GPT        Impression / Plan:        ICD-10-CM ICD-9-CM    1. DM type 2, goal HbA1c < 7% (HCC)  E11.9 250.00 AMB POC HEMOGLOBIN A1C      CBC WITH AUTOMATED DIFF      METABOLIC PANEL, COMPREHENSIVE      LIPID PANEL      TSH 3RD GENERATION       MICROALBUMIN, UR, RAND W/ MICROALB/CREAT RATIO      Insulin Needles, Disposable, 31 gauge x 5/16" ndle      insulin aspart U-100 (NovoLOG Flexpen U-100 Insulin) 100 unit/mL (3 mL) inpn      insulin glargine (Lantus Solostar U-100 Insulin) 100 unit/mL (3 mL) inpn   2. Depression, major, recurrent, mild (HCC)  F33.0 296.31    3. Coronary artery disease involving native coronary artery of native heart without angina pectoris  I25.10 414.01 rosuvastatin (CRESTOR) 10 mg tablet   4. Hypercholesteremia  E78.00 272.0    5. Anxiety  F41.9 300.00 LORazepam (ATIVAN) 0.5 mg tablet   6. Mild intermittent asthma without complication  F64.33 295.18 Nebulizer & Compressor machine      albuterol (Proventil HFA) 90 mcg/actuation inhaler   7. Essential hypertension  I10 401.9 lisinopriL (PRINIVIL, ZESTRIL) 20 mg tablet   8. Vitamin D deficiency  E55.9 268.9 VITAMIN D, 25 HYDROXY   9. B12 deficiency  E53.8 266.2 VITAMIN B12 & FOLATE   10. Lymph node enlargement  R59.9 785.6 CT NECK SOFT TISSUE W WO CONT   11. Cervical lymphadenopathy  R59.0 785.6 CT NECK SOFT TISSUE W WO CONT         Explained to patient risk benefits of the medications.Advised patient to stop meds if having any side effects.Pt verbalized understanding of the instructions.    I have discussed the diagnosis with the patient and the intended plan as seen in the above orders.  The patient has received an after-visit summary and questions were answered concerning future plans.  I have discussed medication side effects and warnings with the patient as well. I have reviewed the plan of care with the patient, accepted their input and they are in agreement with the treatment goals.     Reviewed plan of care. Patient has provided input and agrees with goals.    Follow-up and Dispositions    ?? Return in about 6 weeks (around 10/11/2018).         Elnora Morrison, MD

## 2018-08-30 NOTE — Progress Notes (Signed)
Chief Complaint   Patient presents with   ??? Pre-op Exam     Patient presents today for a preoperative clearance for her anticipated surgery

## 2018-08-30 NOTE — Progress Notes (Signed)
Vitamin D levels are low . I am sending vitamin D to pharmacy.  Also chol is elevated. Pt should take crestor regularly.

## 2018-08-30 NOTE — Progress Notes (Signed)
Cathy [name] is a 53 y.o.  female and presents with     Chief Complaint   Patient presents with   ??? Pre-op Exam     Patient presents today for a preoperative clearance for her anticipated surgery   ??? Diabetes   ??? Anxiety   ??? Asthma   ??? Cholesterol Problem   ??? Hypertension         Pt is supposed to get rt shoulder surgery. However it has not been scheduled yet as her A1c was elevated.  Pt saw cardiologist and was told she has GERD and she was given some med recently.  She says she is taking insulin as directed.  She is not monitoring her sugars.  She wants to lose weight.  She is taking medicine for high blood pressure and cholesterol.  She says the tubing of her nebulizer machine got showed up by the dog.  She is requesting a new nebulizer machine.  She is also requesting Ativan as needed for anxiety.    She says she is taking Flexeril for right shoulder pain till she gets the surgery.  This is being prescribed by the orthopedic specialist.  She is worried about the lymph nodes in her neck.  Her insurance declined to authorize CT scan of her neck.  However she says she is getting Medicare soon and will not like to get it done.  She does see a psychiatrist.    Past Medical History:   Diagnosis Date   ??? Asthma    ??? Depression    ??? Diabetes ([place])     Type 2 DM   ??? Hypercholesterolemia    ??? Hypertension      Past Surgical History:   Procedure Laterality Date   ??? CARDIAC SURG PROCEDURE UNLIST  2019    heart catherization    ??? HX ACL RECONSTRUCTION      right knee   ??? HX APPENDECTOMY     ??? HX ARTERIAL BYPASS      left main artery bypass   ??? HX HYSTERECTOMY     ??? HX WISDOM TEETH EXTRACTION       Current Outpatient Medications   Medication Sig   ??? metFORMIN (GLUCOPHAGE) 500 mg tablet Take 1 Tab by mouth two (2) times daily (with meals).   ??? LORazepam (ATIVAN) 0.5 mg tablet Take 1 Tab by mouth nightly as needed for Anxiety. Max Daily Amount: 0.5 mg.    ??? Nebulizer & Compressor machine 1 Each by Does Not Apply route every four (4) hours as needed for Wheezing or Shortness of Breath.   ??? Insulin Needles, Disposable, 31 gauge x 5/16" ndle Administer NOvolog 10 uniys day before each meal   ??? insulin aspart U-100 (NovoLOG Flexpen U-100 Insulin) 100 unit/mL (3 mL) inpn adminitser 10 units subcut three times daily before each meal   ??? insulin glargine (Lantus Solostar U-100 Insulin) 100 unit/mL (3 mL) inpn 60 Units by SubCUTAneous route nightly.   ??? albuterol (Proventil HFA) 90 mcg/actuation inhaler Take 1 Puff by inhalation every six (6) hours as needed for Wheezing.   ??? lisinopriL (PRINIVIL, ZESTRIL) 20 mg tablet Take 1 Tab by mouth daily.   ??? rosuvastatin (CRESTOR) 10 mg tablet Take 1 Tab by mouth nightly.   ??? clobetasoL (TEMOVATE) 0.05 % topical cream Apply  to affected area two (2) times a day.   ??? escitalopram oxalate (LEXAPRO) 10 mg tablet Take 1 Tab by mouth daily.   ??? ammonium  lactate (AMLACTIN) 12 % topical cream Apply  to affected area two (2) times a day. rub in to affected area well   ??? aspirin delayed-release 81 mg tablet Take 1 Tab by mouth daily.   ??? lurasidone (LATUDA) 20 mg tab tablet Take  by mouth.   ??? lidocaine (LIDODERM) 5 % Apply to the back once daily   ??? albuterol (PROVENTIL VENTOLIN) 2.5 mg /3 mL (0.083 %) nebu Take 3 mL by inhalation every six (6) hours as needed for Wheezing.   ??? Blood-Glucose Meter (ONETOUCH ULTRA2 METER) monitoring kit Use as directed.   ??? metoprolol tartrate (LOPRESSOR) 50 mg tablet TAKE ONE TABLET BY MOUTH TWICE A DAY   ??? montelukast (SINGULAIR) 10 mg tablet Take 1 Tab by mouth daily.   ??? naloxone (NARCAN) 4 mg/actuation nasal spray Use 1 spray intranasally, then discard. Repeat with new spray every 2 min as needed for opioid overdose symptoms, alternating nostrils.   ??? diphenhydrAMINE (BENADRYL ALLERGY) 25 mg tablet Take 1 Tab by mouth nightly as needed for Sleep.   ??? carisoprodol (SOMA) 350 mg tablet     ??? dextroamphetamine-amphetamine (ADDERALL) 20 mg tablet Take 20 mg by mouth two (2) times a day.   ??? glucose blood VI test strips (TRUE METRIX GLUCOSE TEST STRIP) strip Use as instructed   ??? lancets (TRUEPLUS LANCETS) 28 gauge misc Use as directed   ??? traZODone (DESYREL) 100 mg tablet Take 100 mg by mouth nightly as needed.     No current facility-administered medications for this visit.      Health Maintenance   Topic Date Due   ??? Hepatitis C Screening  Dec 14, 1965   ??? Foot Exam Q1  09/13/1973   ??? Eye Exam Retinal or Dilated  09/13/1973   ??? DTaP/Tdap/Td series (1 - Tdap) 09/13/1984   ??? Shingrix Vaccine Age 21> (1 of 2) 09/13/2013   ??? FOBT Q1Y Age 21-75  09/13/2013   ??? MICROALBUMIN Q1  04/06/2018   ??? Lipid Screen  04/06/2018   ??? A1C test (Diabetic or Prediabetic)  04/25/2018   ??? Influenza Age 87 to Adult  08/13/2018   ??? PAP AKA CERVICAL CYTOLOGY  03/06/2019   ??? Breast Cancer Screen Mammogram  04/23/2019   ??? Pneumococcal 0-64 years  Completed     Immunization History   Administered Date(s) Administered   ??? Pneumococcal Polysaccharide (PPSV-23) 04/26/2017     No LMP recorded. Patient has had a hysterectomy.        Allergies and Intolerances:   Allergies   Allergen Reactions   ??? Pyridium [Phenazopyridine] Itching       Family History:   No family history on file.    Social History:   She  reports that she has been smoking. She has a 10.00 pack-year smoking history. She has quit using smokeless tobacco.  She  reports previous alcohol use.            Review of Systems:   General: negative for - chills, fatigue, fever, weight change  Psych: negative for - anxiety, depression, irritability or mood swings  ENT: negative for - headaches, hearing change, nasal congestion, oral lesions, sneezing or sore throat  Heme/ Lymph: negative for - bleeding problems, bruising, pallor or swollen lymph nodes  Endo: negative for - hot flashes, polydipsia/polyuria or temperature intolerance   Resp: negative for - cough, shortness of breath or wheezing  CV: negative for - chest pain, edema or palpitations  GI: negative for - abdominal  pain, change in bowel habits, constipation, diarrhea or nausea/vomiting  GU: negative for - dysuria, hematuria, incontinence, pelvic pain or vulvar/vaginal symptoms  MSK: negative for - joint pain, joint swelling or muscle pain  Neuro: negative for - confusion, headaches, seizures or weakness  Derm: negative for - dry skin, hair changes, rash or skin lesion changes          Physical:   Vitals:   Vitals:    08/30/18 0928   BP: 107/58   Pulse: (!) 51   Resp: 16   Temp: 96.8 ??F (36 ??C)   TempSrc: Temporal   SpO2: 99%   Weight: 183 lb (83 kg)   Height: 5' 6" (1.676 m)           Exam:   HEENT- atraumatic,normocephalic, awake, oriented, well nourished  Neck - supple,no enlarged lymph nodes, no JVD, no thyromegaly  Chest- CTA, no rhonchi, no crackles  Heart- rrr, no murmurs / gallop/rub  Abdomen- soft,BS+,NT, no hepatosplenomegaly  Ext - no c/c/edema   Neuro- no focal deficits.Power 5/5 all extremities  Skin - warm,dry, no obvious rashes.          Review of Data:   LABS:   Lab Results   Component Value Date/Time    WBC 4.9 06/07/2018 10:17 AM    HGB 13.3 06/07/2018 10:17 AM    HCT 39.5 06/07/2018 10:17 AM    PLATELET 259 06/07/2018 10:17 AM     Lab Results   Component Value Date/Time    Sodium 143 06/07/2018 10:17 AM    Potassium 3.9 06/07/2018 10:17 AM    Chloride 106 06/07/2018 10:17 AM    CO2 26 06/07/2018 10:17 AM    Glucose 111 (H) 06/07/2018 10:17 AM    BUN 12 06/07/2018 10:17 AM    Creatinine 0.91 06/07/2018 10:17 AM     Lab Results   Component Value Date/Time    Cholesterol, total 160 04/05/2017 12:32 PM    HDL Cholesterol 61 04/05/2017 12:32 PM    LDL, calculated 77 04/05/2017 12:32 PM    Triglyceride 112 04/05/2017 12:32 PM     No components found for: GPT        Impression / Plan:        ICD-10-CM ICD-9-CM     1. DM type 2, goal HbA1c < 7% (HCC)  E11.9 250.00 AMB POC HEMOGLOBIN A1C      CBC WITH AUTOMATED DIFF      METABOLIC PANEL, COMPREHENSIVE      LIPID PANEL      TSH 3RD GENERATION      MICROALBUMIN, UR, RAND W/ MICROALB/CREAT RATIO      Insulin Needles, Disposable, 31 gauge x 5/16" ndle      insulin aspart U-100 (NovoLOG Flexpen U-100 Insulin) 100 unit/mL (3 mL) inpn      insulin glargine (Lantus Solostar U-100 Insulin) 100 unit/mL (3 mL) inpn   2. Depression, major, recurrent, mild (HCC)  F33.0 296.31    3. Coronary artery disease involving native coronary artery of native heart without angina pectoris  I25.10 414.01 rosuvastatin (CRESTOR) 10 mg tablet   4. Hypercholesteremia  E78.00 272.0    5. Anxiety  F41.9 300.00 LORazepam (ATIVAN) 0.5 mg tablet   6. Mild intermittent asthma without complication  L46.50 354.65 Nebulizer & Compressor machine      albuterol (Proventil HFA) 90 mcg/actuation inhaler   7. Essential hypertension  I10 401.9 lisinopriL (PRINIVIL, ZESTRIL) 20 mg tablet   8. Vitamin D deficiency  E55.9 268.9 VITAMIN D, 25 HYDROXY   9. B12 deficiency  E53.8 266.2 VITAMIN B12 & FOLATE   10. Lymph node enlargement  R59.9 785.6 CT NECK SOFT TISSUE W WO CONT   11. Cervical lymphadenopathy  R59.0 785.6 CT NECK SOFT TISSUE W WO CONT         Explained to patient risk benefits of the medications.Advised patient to stop meds if having any side effects.Pt verbalized understanding of the instructions.    I have discussed the diagnosis with the patient and the intended plan as seen in the above orders.  The patient has received an after-visit summary and questions were answered concerning future plans.  I have discussed medication side effects and warnings with the patient as well. I have reviewed the plan of care with the patient, accepted their input and they are in agreement with the treatment goals.     Reviewed plan of care. Patient has provided input and agrees with goals.    Follow-up and Dispositions     ?? Return in about 6 weeks (around 10/11/2018).         Elnora Morrison, MD

## 2018-08-31 ENCOUNTER — Encounter

## 2018-08-31 LAB — COMPREHENSIVE METABOLIC PANEL
ALT: 41 U/L (ref 12–78)
AST: 17 U/L (ref 15–37)
Albumin/Globulin Ratio: 1.2 (ref 1.1–2.2)
Albumin: 4.3 g/dL (ref 3.5–5.0)
Alkaline Phosphatase: 108 U/L (ref 45–117)
Anion Gap: 4 mmol/L — ABNORMAL LOW (ref 5–15)
BUN: 15 MG/DL (ref 6–20)
Bun/Cre Ratio: 19 (ref 12–20)
CO2: 29 mmol/L (ref 21–32)
Calcium: 9.5 MG/DL (ref 8.5–10.1)
Chloride: 105 mmol/L (ref 97–108)
Creatinine: 0.8 MG/DL (ref 0.55–1.02)
EGFR IF NonAfrican American: >60 mL/min/{1.73_m2} (ref >60)
GFR African American: >60 mL/min/{1.73_m2} (ref >60)
Globulin: 3.5 g/dL (ref 2.0–4.0)
Glucose: 144 mg/dL — ABNORMAL HIGH (ref 65–100)
Potassium: 5 mmol/L (ref 3.5–5.1)
Sodium: 138 mmol/L (ref 136–145)
Total Bilirubin: 0.6 MG/DL (ref 0.2–1.0)
Total Protein: 7.8 g/dL (ref 6.4–8.2)

## 2018-08-31 LAB — TSH 3RD GENERATION
TSH: 0.33 u[IU]/mL — ABNORMAL LOW (ref 0.36–3.74)
TSH: 0.33 u[IU]/mL — ABNORMAL LOW (ref 0.36–3.74)

## 2018-08-31 LAB — VITAMIN D 25 HYDROXY: Vit D, 25-Hydroxy: 14.1 ng/mL — ABNORMAL LOW (ref 30–100)

## 2018-08-31 LAB — MICROALBUMIN / CREATININE URINE RATIO
Creatinine, Ur: 130 mg/dL
Microalb, Ur: 0.68 MG/DL
Microalbumin Creatinine Ratio: 5 mg/g (ref 0–30)

## 2018-08-31 LAB — LIPID PANEL
CHOL/HDL Ratio: 4.4 (ref 0.0–5.0)
Chol/HDL Ratio: 4.4 (ref 0.0–5.0)
Cholesterol, Total: 230 MG/DL — ABNORMAL HIGH (ref <200)
Cholesterol, total: 230 MG/DL — ABNORMAL HIGH (ref <200)
HDL Cholesterol: 52 MG/DL
HDL: 52 MG/DL
LDL Calculated: 151.6 MG/DL — ABNORMAL HIGH (ref 0–100)
LDL, calculated: 151.6 MG/DL — ABNORMAL HIGH (ref 0–100)
Triglyceride: 132 MG/DL (ref <150)
Triglycerides: 132 MG/DL (ref <150)
VLDL Cholesterol Calculated: 26.4 MG/DL
VLDL, calculated: 26.4 MG/DL

## 2018-08-31 LAB — VITAMIN B12 & FOLATE
Folate: 12.1 ng/mL (ref 5.0–21.0)
Folate: 12.1 ng/mL (ref 5.0–21.0)
Vitamin B-12: 743 pg/mL (ref 193–986)
Vitamin B12: 743 pg/mL (ref 193–986)

## 2018-08-31 LAB — METABOLIC PANEL, COMPREHENSIVE
A-G Ratio: 1.2 (ref 1.1–2.2)
ALT (SGPT): 41 U/L (ref 12–78)
AST (SGOT): 17 U/L (ref 15–37)
Albumin: 4.3 g/dL (ref 3.5–5.0)
Alk. phosphatase: 108 U/L (ref 45–117)
Anion gap: 4 mmol/L — ABNORMAL LOW (ref 5–15)
BUN/Creatinine ratio: 19 (ref 12–20)
BUN: 15 MG/DL (ref 6–20)
Bilirubin, total: 0.6 MG/DL (ref 0.2–1.0)
CO2: 29 mmol/L (ref 21–32)
Calcium: 9.5 MG/DL (ref 8.5–10.1)
Chloride: 105 mmol/L (ref 97–108)
Creatinine: 0.8 MG/DL (ref 0.55–1.02)
GFR est AA: >60 mL/min/{1.73_m2} (ref >60)
GFR est non-AA: >60 mL/min/{1.73_m2} (ref >60)
Globulin: 3.5 g/dL (ref 2.0–4.0)
Glucose: 144 mg/dL — ABNORMAL HIGH (ref 65–100)
Potassium: 5 mmol/L (ref 3.5–5.1)
Protein, total: 7.8 g/dL (ref 6.4–8.2)
Sodium: 138 mmol/L (ref 136–145)

## 2018-08-31 LAB — MICROALBUMIN, UR, RAND W/ MICROALB/CREAT RATIO
Creatinine, urine random: 130 mg/dL
Microalbumin,urine random: 0.68 MG/DL
Microalbumin/Creat ratio (mg/g creat): 5 mg/g (ref 0–30)

## 2018-08-31 LAB — VITAMIN D, 25 HYDROXY: Vitamin D 25-Hydroxy: 14.1 ng/mL — ABNORMAL LOW (ref 30–100)

## 2018-09-01 MED ORDER — CHOLECALCIFEROL (VITAMIN D3) 2,000 UNIT CAPSULE
ORAL_CAPSULE | Freq: Every day | ORAL | 0 refills | Status: DC
Start: 2018-09-01 — End: 2018-10-17

## 2018-09-01 NOTE — Telephone Encounter (Signed)
-----   Message from Jenene Slicker, MD sent at 08/31/2018 11:37 PM EDT -----  Vitamin D levels are low . I am sending vitamin D to pharmacy.  Also chol is elevated. Pt should take crestor regularly.

## 2018-09-01 NOTE — Telephone Encounter (Signed)
Patient notified of her results and recommendations. She verbalized her understanding.

## 2018-09-01 NOTE — Telephone Encounter (Signed)
Patient called and stated she has some questions about medications

## 2018-09-01 NOTE — Telephone Encounter (Signed)
Patient states she wanted her medications to come from one provider, she stated she would like to get her adderall, trazodone, lexapro, and ativan from Dr. Danella Penton. Advised patient this was out of Dr. Lurene Shadow scope of practice since he is not a psychiatrist he will not be able to take over those types of medications. She verbalized her understanding and thanked me.

## 2018-10-10 ENCOUNTER — Encounter: Attending: Internal Medicine | Primary: Internal Medicine

## 2018-10-17 ENCOUNTER — Ambulatory Visit: Attending: Internal Medicine | Primary: Internal Medicine

## 2018-10-17 ENCOUNTER — Ambulatory Visit: Admit: 2018-10-17 | Discharge: 2018-10-17 | Payer: MEDICAID | Attending: Internal Medicine | Primary: Internal Medicine

## 2018-10-17 DIAGNOSIS — J45909 Unspecified asthma, uncomplicated: Secondary | ICD-10-CM

## 2018-10-17 MED ORDER — NEBULIZER & COMPRESSOR
0 refills | Status: AC | PRN
Start: 2018-10-17 — End: ?

## 2018-10-17 MED ORDER — FLUTICASONE-SALMETEROL 250 MCG-50 MCG/DOSE DISK DEVICE FOR INHALATION
250-50 mcg/dose | Freq: Two times a day (BID) | RESPIRATORY_TRACT | 3 refills | Status: AC
Start: 2018-10-17 — End: ?

## 2018-10-17 MED ORDER — CHOLECALCIFEROL (VITAMIN D3) 2,000 UNIT CAPSULE
ORAL_CAPSULE | Freq: Every day | ORAL | 0 refills | Status: AC
Start: 2018-10-17 — End: ?

## 2018-10-17 MED ORDER — LORAZEPAM 0.5 MG TAB
0.5 mg | ORAL_TABLET | Freq: Every evening | ORAL | 0 refills | Status: DC | PRN
Start: 2018-10-17 — End: 2019-02-22

## 2018-10-17 MED ORDER — NEBULIZER & COMPRESSOR
0 refills | Status: AC
Start: 2018-10-17 — End: ?

## 2018-10-17 MED ORDER — INSULIN ASPART 100 UNIT/ML (3 ML) SUB-Q PEN
100 unit/mL (3 mL) | PEN_INJECTOR | SUBCUTANEOUS | 3 refills | Status: AC
Start: 2018-10-17 — End: ?

## 2018-10-17 NOTE — Progress Notes (Signed)
Cathy [name] is a 53 y.o. female    Chief Complaint   Patient presents with   . Follow-up       1. Have you been to the ER, urgent care clinic since your last visit?  Hospitalized since your last visit? No      2. Have you seen or consulted any other health care providers outside of the [facility] since your last visit?  Include any pap smears or colon screening. No

## 2018-10-17 NOTE — Progress Notes (Signed)
Cathy [name] is a 53 y.o.  female and presents with     Chief Complaint   Patient presents with   ??? Follow-up   ??? Anxiety   ??? Diabetes   ??? Nicotine Dependence   ??? Abdominal Pain   ??? Asthma     Patient is here for a follow-up.  She sees endocrinology for diabetes and informs that her most recent A1c was around 6.  She does have some anxiety and insomnia.  She also has a history of asthma and is requesting a nebulizer machine.  He does smoke 5 to 6 cigarettes/day.  She also has abdominal pain on the right side.  She mentions that she had appendix removed in the past but not the gallbladder.          Past Medical History:   Diagnosis Date   ??? Asthma    ??? Depression    ??? Diabetes ([place])     Type 2 DM   ??? Hypercholesterolemia    ??? Hypertension      Past Surgical History:   Procedure Laterality Date   ??? CARDIAC SURG PROCEDURE UNLIST  2019    heart catherization    ??? HX ACL RECONSTRUCTION      right knee   ??? HX APPENDECTOMY     ??? HX ARTERIAL BYPASS      left main artery bypass   ??? HX HYSTERECTOMY     ??? HX WISDOM TEETH EXTRACTION       Current Outpatient Medications   Medication Sig   ??? cholecalciferol (VITAMIN D3) (2,000 UNITS /50 MCG) cap capsule Take 2,000 Units by mouth daily.   ??? LORazepam (ATIVAN) 0.5 mg tablet Take 1 Tab by mouth nightly as needed for Anxiety. Max Daily Amount: 0.5 mg.   ??? insulin aspart U-100 (NovoLOG Flexpen U-100 Insulin) 100 unit/mL (3 mL) inpn adminitser 10 units subcut three times daily before each meal   ??? Nebulizer & Compressor machine Albuterol nebulizer treatment  Albuterol 2.5 mg/82m every 6 hours prn   ??? Nebulizer & Compressor machine 1 Each by Does Not Apply route every four (4) hours as needed for Wheezing or Shortness of Breath.   ??? fluticasone propion-salmeteroL (ADVAIR/WIXELA) 250-50 mcg/dose diskus inhaler Take 1 Puff by inhalation every twelve (12) hours.   ??? metFORMIN (GLUCOPHAGE) 500 mg tablet Take 1 Tab by mouth two (2) times daily (with meals).   ??? Insulin Needles,  Disposable, 31 gauge x 5/16" ndle Administer NOvolog 10 uniys day before each meal   ??? insulin glargine (Lantus Solostar U-100 Insulin) 100 unit/mL (3 mL) inpn 60 Units by SubCUTAneous route nightly.   ??? albuterol (Proventil HFA) 90 mcg/actuation inhaler Take 1 Puff by inhalation every six (6) hours as needed for Wheezing.   ??? lisinopriL (PRINIVIL, ZESTRIL) 20 mg tablet Take 1 Tab by mouth daily.   ??? escitalopram oxalate (LEXAPRO) 10 mg tablet Take 1 Tab by mouth daily.   ??? aspirin delayed-release 81 mg tablet Take 1 Tab by mouth daily.   ??? lurasidone (LATUDA) 20 mg tab tablet Take  by mouth.   ??? albuterol (PROVENTIL VENTOLIN) 2.5 mg /3 mL (0.083 %) nebu Take 3 mL by inhalation every six (6) hours as needed for Wheezing.   ??? Blood-Glucose Meter (ONETOUCH ULTRA2 METER) monitoring kit Use as directed.   ??? metoprolol tartrate (LOPRESSOR) 50 mg tablet TAKE ONE TABLET BY MOUTH TWICE A DAY   ??? montelukast (SINGULAIR) 10 mg tablet Take 1  Tab by mouth daily.   ??? naloxone (NARCAN) 4 mg/actuation nasal spray Use 1 spray intranasally, then discard. Repeat with new spray every 2 min as needed for opioid overdose symptoms, alternating nostrils.   ??? diphenhydrAMINE (BENADRYL ALLERGY) 25 mg tablet Take 1 Tab by mouth nightly as needed for Sleep.   ??? carisoprodol (SOMA) 350 mg tablet    ??? dextroamphetamine-amphetamine (ADDERALL) 20 mg tablet Take 20 mg by mouth two (2) times a day.   ??? glucose blood VI test strips (TRUE METRIX GLUCOSE TEST STRIP) strip Use as instructed   ??? lancets (TRUEPLUS LANCETS) 28 gauge misc Use as directed   ??? traZODone (DESYREL) 100 mg tablet Take 100 mg by mouth nightly as needed.   ??? rosuvastatin (CRESTOR) 10 mg tablet Take 1 Tab by mouth nightly.   ??? clobetasoL (TEMOVATE) 0.05 % topical cream Apply  to affected area two (2) times a day.   ??? ammonium lactate (AMLACTIN) 12 % topical cream Apply  to affected area two (2) times a day. rub in to affected area well   ??? lidocaine (LIDODERM) 5 % Apply to the  back once daily     No current facility-administered medications for this visit.      Health Maintenance   Topic Date Due   ??? Hepatitis C Screening  04/18/1965   ??? Foot Exam Q1  09/13/1973   ??? Eye Exam Retinal or Dilated  09/13/1973   ??? DTaP/Tdap/Td series (1 - Tdap) 09/13/1984   ??? Shingrix Vaccine Age 54> (1 of 2) 09/13/2013   ??? FOBT Q1Y Age 54-75  09/13/2013   ??? Flu Vaccine (1) 09/13/2018   ??? A1C test (Diabetic or Prediabetic)  01/25/2019   ??? PAP AKA CERVICAL CYTOLOGY  03/06/2019   ??? Breast Cancer Screen Mammogram  04/23/2019   ??? MICROALBUMIN Q1  08/30/2019   ??? Lipid Screen  08/30/2019   ??? Pneumococcal 0-64 years  Completed     Immunization History   Administered Date(s) Administered   ??? Pneumococcal Polysaccharide (PPSV-23) 04/26/2017     No LMP recorded. Patient has had a hysterectomy.        Allergies and Intolerances:   Allergies   Allergen Reactions   ??? Pyridium [Phenazopyridine] Itching       Family History:   No family history on file.    Social History:   She  reports that she has been smoking. She has a 10.00 pack-year smoking history. She has quit using smokeless tobacco.  She  reports previous alcohol use.            Review of Systems:   General: negative for - chills, fatigue, fever, weight change  Psych: negative for - anxiety, depression, irritability or mood swings  ENT: negative for - headaches, hearing change, nasal congestion, oral lesions, sneezing or sore throat  Heme/ Lymph: negative for - bleeding problems, bruising, pallor or swollen lymph nodes  Endo: negative for - hot flashes, polydipsia/polyuria or temperature intolerance  Resp: negative for - cough, shortness of breath or wheezing  CV: negative for - chest pain, edema or palpitations  GI: negative for - abdominal pain, change in bowel habits, constipation, diarrhea or nausea/vomiting  GU: negative for - dysuria, hematuria, incontinence, pelvic pain or vulvar/vaginal symptoms  MSK: negative for - joint pain, joint swelling or muscle  pain  Neuro: negative for - confusion, headaches, seizures or weakness  Derm: negative for - dry skin, hair changes, rash or skin lesion changes  Physical:   Vitals:   Vitals:    10/17/18 0938   BP: 120/74   Pulse: 62   Resp: 18   Temp: 98.7 ??F (37.1 ??C)   TempSrc: Oral   SpO2: 99%   Weight: 186 lb (84.4 kg)   Height: '5\' 6"'$  (1.676 m)           Exam:   HEENT- atraumatic,normocephalic, awake, oriented, well nourished  Neck - supple,no enlarged lymph nodes, no JVD, no thyromegaly  Chest- CTA, no rhonchi, no crackles  Heart- rrr, no murmurs / gallop/rub  Abdomen- soft,BS+,NT, no hepatosplenomegaly, right upper quadrant tenderness+  Ext - no c/c/edema   Neuro- no focal deficits.Power 5/5 all extremities  Skin - warm,dry, no obvious rashes.          Review of Data:   LABS:   Lab Results   Component Value Date/Time    WBC 4.9 08/30/2018 10:55 AM    HGB 15.2 08/30/2018 10:55 AM    HCT 46.4 08/30/2018 10:55 AM    PLATELET 271 08/30/2018 10:55 AM     Lab Results   Component Value Date/Time    Sodium 138 08/30/2018 10:55 AM    Potassium 5.0 08/30/2018 10:55 AM    Chloride 105 08/30/2018 10:55 AM    CO2 29 08/30/2018 10:55 AM    Glucose 144 (H) 08/30/2018 10:55 AM    BUN 15 08/30/2018 10:55 AM    Creatinine 0.80 08/30/2018 10:55 AM     Lab Results   Component Value Date/Time    Cholesterol, total 230 (H) 08/30/2018 10:55 AM    HDL Cholesterol 52 08/30/2018 10:55 AM    LDL, calculated 151.6 (H) 08/30/2018 10:55 AM    Triglyceride 132 08/30/2018 10:55 AM     No components found for: GPT        Impression / Plan:        ICD-10-CM ICD-9-CM    1. Moderate asthma, unspecified whether complicated, unspecified whether persistent  J45.909 493.90 REFERRAL TO PULMONARY DISEASE      fluticasone propion-salmeteroL (ADVAIR/WIXELA) 250-50 mcg/dose diskus inhaler   2. Vitamin D deficiency  E55.9 268.9 cholecalciferol (VITAMIN D3) (2,000 UNITS /50 MCG) cap capsule   3. Anxiety  F41.9 300.00 LORazepam (ATIVAN) 0.5 mg tablet   4. DM type  2, goal HbA1c < 7% (HCC)  E11.9 250.00 insulin aspart U-100 (NovoLOG Flexpen U-100 Insulin) 100 unit/mL (3 mL) inpn   5. Mild intermittent asthma without complication  W10.93 235.57 Nebulizer & Compressor machine   6. Smoker  F17.200 305.1 fluticasone propion-salmeteroL (ADVAIR/WIXELA) 250-50 mcg/dose diskus inhaler   7. RUQ pain  R10.11 789.01 [place] ABD COMP     Diabetes-stable    Asthma/COPD-advised patient to quit smoking    Explained to patient risk benefits of the medications.Advised patient to stop meds if having any side effects.Pt verbalized understanding of the instructions.    I have discussed the diagnosis with the patient and the intended plan as seen in the above orders.  The patient has received an after-visit summary and questions were answered concerning future plans.  I have discussed medication side effects and warnings with the patient as well. I have reviewed the plan of care with the patient, accepted their input and they are in agreement with the treatment goals.     Reviewed plan of care. Patient has provided input and agrees with goals.    Follow-up and Dispositions    ?? Return in about 1 month (around 11/17/2018).  Joniah Bednarski M Seith Aikey, MD

## 2018-10-17 NOTE — Progress Notes (Signed)
Cathy [name] is a 53 y.o.  female and presents with     Chief Complaint   Patient presents with   ??? Follow-up   ??? Anxiety   ??? Diabetes   ??? Nicotine Dependence   ??? Abdominal Pain   ??? Asthma     Patient is here for a follow-up.  She sees endocrinology for diabetes and informs that her most recent A1c was around 6.  She does have some anxiety and insomnia.  She also has a history of asthma and is requesting a nebulizer machine.  He does smoke 5 to 6 cigarettes/day.  She also has abdominal pain on the right side.  She mentions that she had appendix removed in the past but not the gallbladder.          Past Medical History:   Diagnosis Date   ??? Asthma    ??? Depression    ??? Diabetes ([place])     Type 2 DM   ??? Hypercholesterolemia    ??? Hypertension      Past Surgical History:   Procedure Laterality Date   ??? CARDIAC SURG PROCEDURE UNLIST  2019    heart catherization    ??? HX ACL RECONSTRUCTION      right knee   ??? HX APPENDECTOMY     ??? HX ARTERIAL BYPASS      left main artery bypass   ??? HX HYSTERECTOMY     ??? HX WISDOM TEETH EXTRACTION       Current Outpatient Medications   Medication Sig   ??? cholecalciferol (VITAMIN D3) (2,000 UNITS /50 MCG) cap capsule Take 2,000 Units by mouth daily.   ??? LORazepam (ATIVAN) 0.5 mg tablet Take 1 Tab by mouth nightly as needed for Anxiety. Max Daily Amount: 0.5 mg.   ??? insulin aspart U-100 (NovoLOG Flexpen U-100 Insulin) 100 unit/mL (3 mL) inpn adminitser 10 units subcut three times daily before each meal   ??? Nebulizer & Compressor machine Albuterol nebulizer treatment  Albuterol 2.5 mg/71m every 6 hours prn   ??? Nebulizer & Compressor machine 1 Each by Does Not Apply route every four (4) hours as needed for Wheezing or Shortness of Breath.   ??? fluticasone propion-salmeteroL (ADVAIR/WIXELA) 250-50 mcg/dose diskus inhaler Take 1 Puff by inhalation every twelve (12) hours.   ??? metFORMIN (GLUCOPHAGE) 500 mg tablet Take 1 Tab by mouth two (2) times daily (with meals).    ??? Insulin Needles, Disposable, 31 gauge x 5/16" ndle Administer NOvolog 10 uniys day before each meal   ??? insulin glargine (Lantus Solostar U-100 Insulin) 100 unit/mL (3 mL) inpn 60 Units by SubCUTAneous route nightly.   ??? albuterol (Proventil HFA) 90 mcg/actuation inhaler Take 1 Puff by inhalation every six (6) hours as needed for Wheezing.   ??? lisinopriL (PRINIVIL, ZESTRIL) 20 mg tablet Take 1 Tab by mouth daily.   ??? escitalopram oxalate (LEXAPRO) 10 mg tablet Take 1 Tab by mouth daily.   ??? aspirin delayed-release 81 mg tablet Take 1 Tab by mouth daily.   ??? lurasidone (LATUDA) 20 mg tab tablet Take  by mouth.   ??? albuterol (PROVENTIL VENTOLIN) 2.5 mg /3 mL (0.083 %) nebu Take 3 mL by inhalation every six (6) hours as needed for Wheezing.   ??? Blood-Glucose Meter (ONETOUCH ULTRA2 METER) monitoring kit Use as directed.   ??? metoprolol tartrate (LOPRESSOR) 50 mg tablet TAKE ONE TABLET BY MOUTH TWICE A DAY   ??? montelukast (SINGULAIR) 10 mg tablet Take 1  Tab by mouth daily.   ??? naloxone (NARCAN) 4 mg/actuation nasal spray Use 1 spray intranasally, then discard. Repeat with new spray every 2 min as needed for opioid overdose symptoms, alternating nostrils.   ??? diphenhydrAMINE (BENADRYL ALLERGY) 25 mg tablet Take 1 Tab by mouth nightly as needed for Sleep.   ??? carisoprodol (SOMA) 350 mg tablet    ??? dextroamphetamine-amphetamine (ADDERALL) 20 mg tablet Take 20 mg by mouth two (2) times a day.   ??? glucose blood VI test strips (TRUE METRIX GLUCOSE TEST STRIP) strip Use as instructed   ??? lancets (TRUEPLUS LANCETS) 28 gauge misc Use as directed   ??? traZODone (DESYREL) 100 mg tablet Take 100 mg by mouth nightly as needed.   ??? rosuvastatin (CRESTOR) 10 mg tablet Take 1 Tab by mouth nightly.   ??? clobetasoL (TEMOVATE) 0.05 % topical cream Apply  to affected area two (2) times a day.   ??? ammonium lactate (AMLACTIN) 12 % topical cream Apply  to affected area two (2) times a day. rub in to affected area well    ??? lidocaine (LIDODERM) 5 % Apply to the back once daily     No current facility-administered medications for this visit.      Health Maintenance   Topic Date Due   ??? Hepatitis C Screening  Jul 15, 1965   ??? Foot Exam Q1  09/13/1973   ??? Eye Exam Retinal or Dilated  09/13/1973   ??? DTaP/Tdap/Td series (1 - Tdap) 09/13/1984   ??? Shingrix Vaccine Age 39> (1 of 2) 09/13/2013   ??? FOBT Q1Y Age 39-75  09/13/2013   ??? Flu Vaccine (1) 09/13/2018   ??? A1C test (Diabetic or Prediabetic)  01/25/2019   ??? PAP AKA CERVICAL CYTOLOGY  03/06/2019   ??? Breast Cancer Screen Mammogram  04/23/2019   ??? MICROALBUMIN Q1  08/30/2019   ??? Lipid Screen  08/30/2019   ??? Pneumococcal 0-64 years  Completed     Immunization History   Administered Date(s) Administered   ??? Pneumococcal Polysaccharide (PPSV-23) 04/26/2017     No LMP recorded. Patient has had a hysterectomy.        Allergies and Intolerances:   Allergies   Allergen Reactions   ??? Pyridium [Phenazopyridine] Itching       Family History:   No family history on file.    Social History:   She  reports that she has been smoking. She has a 10.00 pack-year smoking history. She has quit using smokeless tobacco.  She  reports previous alcohol use.            Review of Systems:   General: negative for - chills, fatigue, fever, weight change  Psych: negative for - anxiety, depression, irritability or mood swings  ENT: negative for - headaches, hearing change, nasal congestion, oral lesions, sneezing or sore throat  Heme/ Lymph: negative for - bleeding problems, bruising, pallor or swollen lymph nodes  Endo: negative for - hot flashes, polydipsia/polyuria or temperature intolerance  Resp: negative for - cough, shortness of breath or wheezing  CV: negative for - chest pain, edema or palpitations  GI: negative for - abdominal pain, change in bowel habits, constipation, diarrhea or nausea/vomiting  GU: negative for - dysuria, hematuria, incontinence, pelvic pain or vulvar/vaginal symptoms   MSK: negative for - joint pain, joint swelling or muscle pain  Neuro: negative for - confusion, headaches, seizures or weakness  Derm: negative for - dry skin, hair changes, rash or skin lesion changes  Physical:   Vitals:   Vitals:    10/17/18 0938   BP: 120/74   Pulse: 62   Resp: 18   Temp: 98.7 ??F (37.1 ??C)   TempSrc: Oral   SpO2: 99%   Weight: 186 lb (84.4 kg)   Height: '5\' 6"'$  (1.676 m)           Exam:   HEENT- atraumatic,normocephalic, awake, oriented, well nourished  Neck - supple,no enlarged lymph nodes, no JVD, no thyromegaly  Chest- CTA, no rhonchi, no crackles  Heart- rrr, no murmurs / gallop/rub  Abdomen- soft,BS+,NT, no hepatosplenomegaly, right upper quadrant tenderness+  Ext - no c/c/edema   Neuro- no focal deficits.Power 5/5 all extremities  Skin - warm,dry, no obvious rashes.          Review of Data:   LABS:   Lab Results   Component Value Date/Time    WBC 4.9 08/30/2018 10:55 AM    HGB 15.2 08/30/2018 10:55 AM    HCT 46.4 08/30/2018 10:55 AM    PLATELET 271 08/30/2018 10:55 AM     Lab Results   Component Value Date/Time    Sodium 138 08/30/2018 10:55 AM    Potassium 5.0 08/30/2018 10:55 AM    Chloride 105 08/30/2018 10:55 AM    CO2 29 08/30/2018 10:55 AM    Glucose 144 (H) 08/30/2018 10:55 AM    BUN 15 08/30/2018 10:55 AM    Creatinine 0.80 08/30/2018 10:55 AM     Lab Results   Component Value Date/Time    Cholesterol, total 230 (H) 08/30/2018 10:55 AM    HDL Cholesterol 52 08/30/2018 10:55 AM    LDL, calculated 151.6 (H) 08/30/2018 10:55 AM    Triglyceride 132 08/30/2018 10:55 AM     No components found for: GPT        Impression / Plan:        ICD-10-CM ICD-9-CM    1. Moderate asthma, unspecified whether complicated, unspecified whether persistent  J45.909 493.90 REFERRAL TO PULMONARY DISEASE      fluticasone propion-salmeteroL (ADVAIR/WIXELA) 250-50 mcg/dose diskus inhaler   2. Vitamin D deficiency  E55.9 268.9 cholecalciferol (VITAMIN D3) (2,000 UNITS /50 MCG) cap capsule    3. Anxiety  F41.9 300.00 LORazepam (ATIVAN) 0.5 mg tablet   4. DM type 2, goal HbA1c < 7% (HCC)  E11.9 250.00 insulin aspart U-100 (NovoLOG Flexpen U-100 Insulin) 100 unit/mL (3 mL) inpn   5. Mild intermittent asthma without complication  Z30.86 578.46 Nebulizer & Compressor machine   6. Smoker  F17.200 305.1 fluticasone propion-salmeteroL (ADVAIR/WIXELA) 250-50 mcg/dose diskus inhaler   7. RUQ pain  R10.11 789.01 [place] ABD COMP     Diabetes-stable    Asthma/COPD-advised patient to quit smoking    Explained to patient risk benefits of the medications.Advised patient to stop meds if having any side effects.Pt verbalized understanding of the instructions.    I have discussed the diagnosis with the patient and the intended plan as seen in the above orders.  The patient has received an after-visit summary and questions were answered concerning future plans.  I have discussed medication side effects and warnings with the patient as well. I have reviewed the plan of care with the patient, accepted their input and they are in agreement with the treatment goals.     Reviewed plan of care. Patient has provided input and agrees with goals.    Follow-up and Dispositions    ?? Return in about 1 month (around 11/17/2018).  Earma Nicolaou M Ilena Dieckman, MD

## 2018-10-17 NOTE — Progress Notes (Signed)
Cathy [name] is a 53 y.o. female    Chief Complaint   Patient presents with   ??? Follow-up       1. Have you been to the ER, urgent care clinic since your last visit?  Hospitalized since your last visit? No      2. Have you seen or consulted any other health care providers outside of the [facility] since your last visit?  Include any pap smears or colon screening. No

## 2018-10-21 ENCOUNTER — Encounter

## 2018-10-21 ENCOUNTER — Inpatient Hospital Stay: Admit: 2018-10-21 | Payer: MEDICAID | Attending: Critical Care Medicine | Primary: Internal Medicine

## 2018-10-21 ENCOUNTER — Inpatient Hospital Stay: Admit: 2018-10-21 | Payer: MEDICAID | Attending: Internal Medicine | Primary: Internal Medicine

## 2018-10-21 DIAGNOSIS — R0602 Shortness of breath: Secondary | ICD-10-CM

## 2018-10-21 NOTE — Progress Notes (Signed)
Ultrasound shows fatty liver and multiple gallstones. I am ordering HIDA scan for the pt.

## 2018-10-21 NOTE — Progress Notes (Signed)
Pt informed of test results and need for HIDA scan.  Pt states she already has it scheduled for 11/03/18 at 1pm.

## 2018-10-21 NOTE — Progress Notes (Signed)
Pt informed of test results and need for HIDA scan.  Pt states she already has it scheduled for 11/03/18 at 1pm.

## 2018-10-23 ENCOUNTER — Encounter

## 2018-10-23 NOTE — Progress Notes (Signed)
Ultrasound shows fatty liver and multiple gallstones. I am ordering HIDA scan for the pt.

## 2018-10-25 ENCOUNTER — Encounter

## 2018-10-25 NOTE — Telephone Encounter (Signed)
Pt. Needs albuterol 2.5/28ml refilled for nebulizer treatment. Pt. "Stated the pharmacy received the nebulizer order but not the medicine that comes with it."

## 2018-10-27 NOTE — Telephone Encounter (Signed)
Pt is calling because she is going out of town today and is waiting on the medication for nebulizer. She called two days ago and still has not received it.     Are you able to approve the medication and send it in so she is able to get it today before she leaves?     Pt would like a phone call when it is sent. She is supposed to leave town by 1

## 2018-10-29 NOTE — Telephone Encounter (Signed)
Last fill: 01/24/18  Last visit: 10/17/18

## 2018-10-31 MED ORDER — ALBUTEROL SULFATE 0.083 % (0.83 MG/ML) SOLN FOR INHALATION
2.5 mg /3 mL (0.083 %) | INHALATION_SOLUTION | Freq: Four times a day (QID) | RESPIRATORY_TRACT | 2 refills | Status: AC | PRN
Start: 2018-10-31 — End: ?

## 2018-11-03 ENCOUNTER — Inpatient Hospital Stay: Payer: MEDICAID | Attending: Internal Medicine | Primary: Internal Medicine

## 2018-11-07 ENCOUNTER — Inpatient Hospital Stay: Payer: MEDICAID | Attending: Internal Medicine | Primary: Internal Medicine

## 2018-11-09 ENCOUNTER — Encounter

## 2018-11-09 NOTE — Telephone Encounter (Signed)
90 day refill

## 2018-11-17 NOTE — Telephone Encounter (Signed)
Pharmacy sending another request for this medication for 90 days supply.

## 2018-11-18 NOTE — Telephone Encounter (Signed)
Last office visit 10/17/2018  Last med refill 08/30/2018

## 2018-11-21 MED ORDER — ROSUVASTATIN 10 MG TAB
10 mg | ORAL_TABLET | Freq: Every evening | ORAL | 2 refills | Status: AC
Start: 2018-11-21 — End: ?

## 2018-12-14 ENCOUNTER — Other Ambulatory Visit: Payer: Self-pay

## 2018-12-14 ENCOUNTER — Ambulatory Visit (INDEPENDENT_AMBULATORY_CARE_PROVIDER_SITE_OTHER): Payer: Commercial Managed Care - HMO | Admitting: Emergency Medicine

## 2018-12-14 ENCOUNTER — Encounter: Payer: Self-pay | Admitting: Emergency Medicine

## 2018-12-14 ENCOUNTER — Telehealth: Payer: Self-pay | Admitting: *Deleted

## 2018-12-14 VITALS — BP 108/41 | HR 50 | Temp 97.7°F | Resp 16 | Ht 66.0 in | Wt 181.0 lb

## 2018-12-14 DIAGNOSIS — E1169 Type 2 diabetes mellitus with other specified complication: Secondary | ICD-10-CM | POA: Diagnosis not present

## 2018-12-14 DIAGNOSIS — F329 Major depressive disorder, single episode, unspecified: Secondary | ICD-10-CM

## 2018-12-14 DIAGNOSIS — E1159 Type 2 diabetes mellitus with other circulatory complications: Secondary | ICD-10-CM

## 2018-12-14 DIAGNOSIS — Z23 Encounter for immunization: Secondary | ICD-10-CM | POA: Diagnosis not present

## 2018-12-14 DIAGNOSIS — Z1211 Encounter for screening for malignant neoplasm of colon: Secondary | ICD-10-CM

## 2018-12-14 DIAGNOSIS — Z8659 Personal history of other mental and behavioral disorders: Secondary | ICD-10-CM

## 2018-12-14 DIAGNOSIS — I152 Hypertension secondary to endocrine disorders: Secondary | ICD-10-CM

## 2018-12-14 DIAGNOSIS — F32A Depression, unspecified: Secondary | ICD-10-CM

## 2018-12-14 DIAGNOSIS — E785 Hyperlipidemia, unspecified: Secondary | ICD-10-CM | POA: Diagnosis not present

## 2018-12-14 DIAGNOSIS — I119 Hypertensive heart disease without heart failure: Secondary | ICD-10-CM

## 2018-12-14 DIAGNOSIS — E1165 Type 2 diabetes mellitus with hyperglycemia: Secondary | ICD-10-CM

## 2018-12-14 DIAGNOSIS — M65331 Trigger finger, right middle finger: Secondary | ICD-10-CM

## 2018-12-14 DIAGNOSIS — Z794 Long term (current) use of insulin: Secondary | ICD-10-CM

## 2018-12-14 DIAGNOSIS — I10 Essential (primary) hypertension: Secondary | ICD-10-CM

## 2018-12-14 DIAGNOSIS — Z8709 Personal history of other diseases of the respiratory system: Secondary | ICD-10-CM

## 2018-12-14 DIAGNOSIS — F17200 Nicotine dependence, unspecified, uncomplicated: Secondary | ICD-10-CM

## 2018-12-14 DIAGNOSIS — F419 Anxiety disorder, unspecified: Secondary | ICD-10-CM

## 2018-12-14 DIAGNOSIS — Z7689 Persons encountering health services in other specified circumstances: Secondary | ICD-10-CM

## 2018-12-14 DIAGNOSIS — Z8679 Personal history of other diseases of the circulatory system: Secondary | ICD-10-CM

## 2018-12-14 LAB — AMB EXT HGBA1C
Hemoglobin A1C, External: 6.2 %
Hemoglobin A1c, External: 6.2 %

## 2018-12-14 LAB — POCT GLYCOSYLATED HEMOGLOBIN (HGB A1C): Hemoglobin A1C: 6.2 % — AB (ref 4.0–5.6)

## 2018-12-14 LAB — GLUCOSE, POCT (MANUAL RESULT ENTRY): POC Glucose: 168 mg/dl — AB (ref 70–99)

## 2018-12-14 MED ORDER — INSULIN GLARGINE 100 UNITS/ML SOLOSTAR PEN: 30.0000 [IU] | PEN_INJECTOR | Freq: Every day | SUBCUTANEOUS | 3 refills | Status: DC

## 2018-12-14 NOTE — Telephone Encounter (Signed)
Called [facility] [place] spoke to pharmacist Orpah Cobb, with the doctor's DEA #. Order the Lantus 60 units to inject into skin at bedtime and supplies, per Dr Mitchel Honour.

## 2018-12-14 NOTE — Patient Instructions (Addendum)
   If you have lab work done today you will be contacted with your lab results within the next 2 weeks.  If you have not heard from us then please contact us. The fastest way to get your results is to register for My Chart.   IF you received an x-ray today, you will receive an invoice from [facility]. Please contact [facility] at [phone] with questions or concerns regarding your invoice.   IF you received labwork today, you will receive an invoice from [facility]. Please contact [facility] at [phone] with questions or concerns regarding your invoice.   Our billing staff will not be able to assist you with questions regarding bills from these companies.  You will be contacted with the lab results as soon as they are available. The fastest way to get your results is to activate your My Chart account. Instructions are located on the last page of this paperwork. If you have not heard from us regarding the results in 2 weeks, please contact this office.      Health Maintenance, Female Adopting a healthy lifestyle and getting preventive care are important in promoting health and wellness. Ask your health care provider about:  The right schedule for you to have regular tests and exams.  Things you can do on your own to prevent diseases and keep yourself healthy. What should I know about diet, weight, and exercise? Eat a healthy diet   Eat a diet that includes plenty of vegetables, fruits, low-fat dairy products, and lean protein.  Do not eat a lot of foods that are high in solid fats, added sugars, or sodium. Maintain a healthy weight Body mass index (BMI) is used to identify weight problems. It estimates body fat based on height and weight. Your health care provider can help determine your BMI and help you achieve or maintain a healthy weight. Get regular exercise Get regular exercise. This is one of the most important things you can do for your health. Most  adults should:  Exercise for at least 150 minutes each week. The exercise should increase your heart rate and make you sweat (moderate-intensity exercise).  Do strengthening exercises at least twice a week. This is in addition to the moderate-intensity exercise.  Spend less time sitting. Even light physical activity can be beneficial. Watch cholesterol and blood lipids Have your blood tested for lipids and cholesterol at 55 years of age, then have this test every 5 years. Have your cholesterol levels checked more often if:  Your lipid or cholesterol levels are high.  You are older than 55 years of age.  You are at high risk for heart disease. What should I know about cancer screening? Depending on your health history and family history, you may need to have cancer screening at various ages. This may include screening for:  Breast cancer.  Cervical cancer.  Colorectal cancer.  Skin cancer.  Lung cancer. What should I know about heart disease, diabetes, and high blood pressure? Blood pressure and heart disease  High blood pressure causes heart disease and increases the risk of stroke. This is more likely to develop in people who have high blood pressure readings, are of African descent, or are overweight.  Have your blood pressure checked: ? Every 3-5 years if you are 18-39 years of age. ? Every year if you are 40 years old or older. Diabetes Have regular diabetes screenings. This checks your fasting blood sugar level. Have the screening done:  Once every   three years after age 40 if you are at a normal weight and have a low risk for diabetes.  More often and at a younger age if you are overweight or have a high risk for diabetes. What should I know about preventing infection? Hepatitis B If you have a higher risk for hepatitis B, you should be screened for this virus. Talk with your health care provider to find out if you are at risk for hepatitis B infection. Hepatitis  C Testing is recommended for:  Everyone born from 1945 through 1965.  Anyone with known risk factors for hepatitis C. Sexually transmitted infections (STIs)  Get screened for STIs, including gonorrhea and chlamydia, if: ? You are sexually active and are younger than 55 years of age. ? You are older than 55 years of age and your health care provider tells you that you are at risk for this type of infection. ? Your sexual activity has changed since you were last screened, and you are at increased risk for chlamydia or gonorrhea. Ask your health care provider if you are at risk.  Ask your health care provider about whether you are at high risk for HIV. Your health care provider may recommend a prescription medicine to help prevent HIV infection. If you choose to take medicine to prevent HIV, you should first get tested for HIV. You should then be tested every 3 months for as long as you are taking the medicine. Pregnancy  If you are about to stop having your period (premenopausal) and you may become pregnant, seek counseling before you get pregnant.  Take 400 to 800 micrograms (mcg) of folic acid every day if you become pregnant.  Ask for birth control (contraception) if you want to prevent pregnancy. Osteoporosis and menopause Osteoporosis is a disease in which the bones lose minerals and strength with aging. This can result in bone fractures. If you are 65 years old or older, or if you are at risk for osteoporosis and fractures, ask your health care provider if you should:  Be screened for bone loss.  Take a calcium or vitamin D supplement to lower your risk of fractures.  Be given hormone replacement therapy (HRT) to treat symptoms of menopause. Follow these instructions at home: Lifestyle  Do not use any products that contain nicotine or tobacco, such as cigarettes, e-cigarettes, and chewing tobacco. If you need help quitting, ask your health care provider.  Do not use street  drugs.  Do not share needles.  Ask your health care provider for help if you need support or information about quitting drugs. Alcohol use  Do not drink alcohol if: ? Your health care provider tells you not to drink. ? You are pregnant, may be pregnant, or are planning to become pregnant.  If you drink alcohol: ? Limit how much you use to 0-1 drink a day. ? Limit intake if you are breastfeeding.  Be aware of how much alcohol is in your drink. In the [place] one drink equals one 12 oz bottle of beer (355 mL), one 5 oz glass of wine (148 mL), or one 1 oz glass of hard liquor (44 mL). General instructions  Schedule regular health, dental, and eye exams.  Stay current with your vaccines.  Tell your health care provider if: ? You often feel depressed. ? You have ever been abused or do not feel safe at home. Summary  Adopting a healthy lifestyle and getting preventive care are important in promoting health and wellness.    Follow your health care provider's instructions about healthy diet, exercising, and getting tested or screened for diseases.  Follow your health care provider's instructions on monitoring your cholesterol and blood pressure. This information is not intended to replace advice given to you by your health care provider. Make sure you discuss any questions you have with your health care provider. Document Released: 07/14/2010 Document Revised: 12/22/2017 Document Reviewed: 12/22/2017 [employer] Patient Education  2020 [employer]  

## 2018-12-14 NOTE — Progress Notes (Signed)
Stormy Card 53 y.o.   Chief Complaint  Patient presents with  . Establish Care  . Diabetes  . Hypertension    HISTORY OF PRESENT ILLNESS: This is a 53 y.o. female first visit to this office, here to establish care with me.  Recently moved from [place] to [facility].  Has the following chronic medical problems: 1.  Diabetes insulin-dependent: Takes NovoLog insulin premeals and Lantus at bedtime. 2.  Hypertension: On lisinopril and metoprolol. 3.  Coronary artery disease: Status post CABG 4 years ago. 4.  History of asthma and current smoker with no intention of stopping. 5.  Depression/anxiety, mood disorder.  On disability for mental health. Needs referral to endocrinologist, cardiologist, psychiatrist, and GI.   HPI   Prior to Admission medications   Medication Sig Start Date End Date Taking? Authorizing Provider  albuterol (PROVENTIL HFA;VENTOLIN HFA) 108 (90 Base) MCG/ACT inhaler Inhale 1 puff into the lungs every 6 (six) hours as needed for wheezing or shortness of breath. 07/05/15  Yes Jegede, Olugbemiga E, MD  albuterol (PROVENTIL) (2.5 MG/3ML) 0.083% nebulizer solution Take 3 mLs (2.5 mg total) by nebulization every 6 (six) hours as needed for wheezing or shortness of breath. 04/15/15  Yes Barrett, Erin R, PA-C  amphetamine-dextroamphetamine (ADDERALL) 20 MG tablet Take 1 tablet (20 mg total) by mouth daily. 04/15/15  Yes Barrett, Erin R, PA-C  aspirin 325 MG EC tablet Take 1 tablet (325 mg total) by mouth daily. 04/15/15  Yes Barrett, Erin R, PA-C  citalopram (CELEXA) 20 MG tablet Take 1 tablet (20 mg total) by mouth daily. 04/23/15  Yes Langeland, Dawn T, MD  Fluticasone-Salmeterol (ADVAIR) 500-50 MCG/DOSE AEPB Inhale 1 puff into the lungs 2 (two) times daily. 07/05/15  Yes Tresa Garter, MD  HYDROcodone-acetaminophen (NORCO) 5-325 MG tablet Take 1 tablet by mouth every 6 (six) hours as needed for moderate pain. 05/15/15  Yes Ivin Poot, MD   hydrOXYzine (ATARAX/VISTARIL) 25 MG tablet Take 1 tablet (25 mg total) by mouth 3 (three) times daily as needed for anxiety. 04/15/15  Yes Barrett, Erin R, PA-C  insulin glargine (LANTUS) 100 unit/mL SOPN Inject 0.3 mLs (30 Units total) into the skin daily after breakfast. 07/03/15  Yes Jegede, Olugbemiga E, MD  lisinopril (PRINIVIL,ZESTRIL) 5 MG tablet Take 1 tablet (5 mg total) by mouth daily. 04/15/15  Yes Barrett, Erin R, PA-C  metoprolol (LOPRESSOR) 50 MG tablet Take 1 tablet (50 mg total) by mouth 2 (two) times daily. 04/15/15  Yes Barrett, Erin R, PA-C  Blood Glucose Monitoring Suppl (TRUE METRIX METER) w/Device KIT Use as directed 07/12/15   Lottie Mussel T, MD  glucose blood (TRUE METRIX BLOOD GLUCOSE TEST) test strip Use as instructed 07/12/15   Lottie Mussel T, MD  Insulin Glulisine (APIDRA SOLOSTAR) 100 UNIT/ML Solostar Pen Inject 22 Units into the skin 2 (two) times daily after a meal. Patient not taking: Reported on 12/14/2018 07/03/15   Tresa Garter, MD  Melatonin 3 MG TABS Take 1 tablet (3 mg total) by mouth at bedtime as needed. Patient not taking: Reported on 12/14/2018 04/23/15   Maren Reamer, MD  methocarbamol (ROBAXIN) 500 MG tablet Take 2 tablets (1,000 mg total) by mouth 4 (four) times daily. X7 days then prn muscle spasm Patient not taking: Reported on 12/14/2018 05/31/15   Argentina Donovan, PA-C  rosuvastatin (CRESTOR) 10 MG tablet Take 1 tablet (10 mg total) by mouth daily. Patient not taking: Reported on 12/14/2018 04/23/15  Maren Reamer, MD  TRUEPLUS LANCETS 28G MISC Use as directed 07/12/15   Maren Reamer, MD    Allergies  Allergen Reactions  . Strattera [Atomoxetine Hcl] Other (See Comments)    Caused yeast infection per [facility] records  . Pyridium [Phenazopyridine Hcl] Itching    Patient Active Problem List   Diagnosis Date Noted  . Hyperlipidemia associated with type 2 diabetes mellitus ([place]) 05/06/2015  . Adjustment reaction with mixed  emotional features 04/22/2015  . S/P CABG x 1 04/11/2015  . Unstable angina ([place])   . Hypertensive heart disease without heart failure   . Diabetes mellitus, type 2 ([place]) 04/06/2015  . Hyperlipidemia 04/06/2015  . ADHD (attention deficit hyperactivity disorder) 04/06/2015  . Tobacco abuse 04/06/2015  . Asthma 04/06/2015    Past Medical History:  Diagnosis Date  . Asthma   . CAD (coronary artery disease) 04/06/2015   CABG with LIMA-LAD  . Diabetes mellitus without complication ([place])   . Myocardial infarction ([place])    Approx 2014 per pt report, no further details available    Past Surgical History:  Procedure Laterality Date  . ABDOMINAL HYSTERECTOMY    . APPENDECTOMY    . CARDIAC CATHETERIZATION N/A 04/08/2015   Procedure: Left Heart Cath and Coronary Angiography;  Surgeon: Jettie Booze, MD; LAD 95%, single vessel disease   . CORONARY ARTERY BYPASS GRAFT N/A 04/11/2015   Procedure: CORONARY ARTERY BYPASS GRAFTING (CABG) x one using left internal mamary artery. ;  Surgeon: Ivin Poot, MD;  LIMA-LAD  . TEE WITHOUT CARDIOVERSION N/A 04/11/2015   Procedure: TRANSESOPHAGEAL ECHOCARDIOGRAM (TEE);  Surgeon: Ivin Poot, MD;  Location: [facility];  Service: Open Heart Surgery;  Laterality: N/A;    Social History   Socioeconomic History  . Marital status: Married    Spouse name: Not on file  . Number of children: Not on file  . Years of education: Not on file  . Highest education level: Not on file  Occupational History  . Not on file  Social Needs  . Financial resource strain: Not on file  . Food insecurity    Worry: Not on file    Inability: Not on file  . Transportation needs    Medical: Not on file    Non-medical: Not on file  Tobacco Use  . Smoking status: Former Smoker    Types: Cigarettes  . Smokeless tobacco: Never Used  Substance and Sexual Activity  . Alcohol use: Yes    Alcohol/week: 3.0 standard drinks    Types: 3 Glasses of wine per week     Comment: occasional  . Drug use: No  . Sexual activity: Not on file  Lifestyle  . Physical activity    Days per week: Not on file    Minutes per session: Not on file  . Stress: Not on file  Relationships  . Social Herbalist on phone: Not on file    Gets together: Not on file    Attends religious service: Not on file    Active member of club or organization: Not on file    Attends meetings of clubs or organizations: Not on file    Relationship status: Not on file  . Intimate partner violence    Fear of current or ex partner: Not on file    Emotionally abused: Not on file    Physically abused: Not on file    Forced sexual activity: Not on file  Other Topics Concern  .  Not on file  Social History Narrative  . Not on file    Family History  Problem Relation Age of Onset  . Heart disease Unknown        grandfather  . Diabetes Unknown        mother     Review of Systems  Constitutional: Negative.  Negative for chills and fever.  HENT: Negative.  Negative for congestion and sore throat.   Respiratory: Negative.  Negative for cough and shortness of breath.   Cardiovascular: Negative.  Negative for chest pain and palpitations.  Gastrointestinal: Negative.  Negative for abdominal pain, diarrhea, nausea and vomiting.  Genitourinary: Negative.  Negative for dysuria and hematuria.  Musculoskeletal: Negative.  Negative for myalgias and neck pain.       Chronic right shoulder pain, history of rotator cuff problems. Right middle trigger finger.  Skin: Negative.  Negative for rash.  Neurological: Negative.  Negative for dizziness and headaches.  All other systems reviewed and are negative.   Today's Vitals   12/14/18 1037  BP: (!) 108/41  Pulse: (!) 50  Resp: 16  Temp: 97.7 F (36.5 C)  TempSrc: Oral  SpO2: 97%  Weight: 181 lb (82.1 kg)  Height: '5\' 6"'$  (1.676 m)   Body mass index is 29.21 kg/m.  Physical Exam Vitals signs reviewed.  Constitutional:       Appearance: Normal appearance.  HENT:     Head: Normocephalic.  Eyes:     Extraocular Movements: Extraocular movements intact.     Conjunctiva/sclera: Conjunctivae normal.     Pupils: Pupils are equal, round, and reactive to light.  Neck:     Musculoskeletal: Normal range of motion and neck supple.  Cardiovascular:     Rate and Rhythm: Normal rate and regular rhythm.     Pulses: Normal pulses.     Heart sounds: Normal heart sounds.  Pulmonary:     Effort: Pulmonary effort is normal.     Breath sounds: Normal breath sounds.  Musculoskeletal: Normal range of motion.  Skin:    General: Skin is warm and dry.     Capillary Refill: Capillary refill takes less than 2 seconds.  Neurological:     General: No focal deficit present.     Mental Status: She is alert and oriented to person, place, and time.  Psychiatric:        Mood and Affect: Mood normal.        Behavior: Behavior normal.      ASSESSMENT & PLAN: Waver was seen today for establish care, diabetes and hypertension.  Diagnoses and all orders for this visit:  Hypertension associated with diabetes ([place]) -     Discontinue: insulin glargine (LANTUS) 100 unit/mL SOPN; Inject 0.3 mLs (30 Units total) into the skin at bedtime. -     Comprehensive metabolic panel -     CBC with Differential -     insulin glargine (LANTUS) 100 unit/mL SOPN; Inject 0.3 mLs (30 Units total) into the skin at bedtime.  Dyslipidemia associated with type 2 diabetes mellitus ([facility]) -     POCT glucose (manual entry) -     POCT glycosylated hemoglobin (Hb A1C) -     Lipid panel  Hypertensive heart disease without heart failure -     Ambulatory referral to Cardiology  History of asthma  Smoker  History of ADHD  History of coronary artery disease -     Ambulatory referral to Cardiology  Need for prophylactic vaccination against Streptococcus pneumoniae (  pneumococcus) -     Pneumococcal (PPSV23) vaccine  Encounter to establish care  Colon  cancer screening -     Ambulatory referral to Gastroenterology  Anxiety and depression -     Ambulatory referral to Psychiatry  Trigger middle finger of right hand -     Ambulatory referral to Orthopedic Surgery  Type 2 diabetes mellitus with hyperglycemia, with long-term current use of insulin ([place]) -     Ambulatory referral to Endocrinology    Patient Instructions       If you have lab work done today you will be contacted with your lab results within the next 2 weeks.  If you have not heard from [place] then please contact us. The fastest way to get your results is to register for My Chart.   IF you received an x-ray today, you will receive an invoice from [facility]. Please contact [facility] at [phone] with questions or concerns regarding your invoice.   IF you received labwork today, you will receive an invoice from [facility]. Please contact [facility] at [phone] with questions or concerns regarding your invoice.   Our billing staff will not be able to assist you with questions regarding bills from these companies.  You will be contacted with the lab results as soon as they are available. The fastest way to get your results is to activate your My Chart account. Instructions are located on the last page of this paperwork. If you have not heard from [place] regarding the results in 2 weeks, please contact this office.     Health Maintenance, Female Adopting a healthy lifestyle and getting preventive care are important in promoting health and wellness. Ask your health care provider about:  The right schedule for you to have regular tests and exams.  Things you can do on your own to prevent diseases and keep yourself healthy. What should I know about diet, weight, and exercise? Eat a healthy diet   Eat a diet that includes plenty of vegetables, fruits, low-fat dairy products, and lean protein.  Do not eat a lot of foods that are high in solid fats,  added sugars, or sodium. Maintain a healthy weight Body mass index (BMI) is used to identify weight problems. It estimates body fat based on height and weight. Your health care provider can help determine your BMI and help you achieve or maintain a healthy weight. Get regular exercise Get regular exercise. This is one of the most important things you can do for your health. Most adults should:  Exercise for at least 150 minutes each week. The exercise should increase your heart rate and make you sweat (moderate-intensity exercise).  Do strengthening exercises at least twice a week. This is in addition to the moderate-intensity exercise.  Spend less time sitting. Even light physical activity can be beneficial. Watch cholesterol and blood lipids Have your blood tested for lipids and cholesterol at 55 years of age, then have this test every 5 years. Have your cholesterol levels checked more often if:  Your lipid or cholesterol levels are high.  You are older than 55 years of age.  You are at high risk for heart disease. What should I know about cancer screening? Depending on your health history and family history, you may need to have cancer screening at various ages. This may include screening for:  Breast cancer.  Cervical cancer.  Colorectal cancer.  Skin cancer.  Lung cancer. What should I know about heart disease, diabetes, and high  blood pressure? Blood pressure and heart disease  High blood pressure causes heart disease and increases the risk of stroke. This is more likely to develop in people who have high blood pressure readings, are of African descent, or are overweight.  Have your blood pressure checked: ? Every 3-5 years if you are 23-18 years of age. ? Every year if you are 70 years old or older. Diabetes Have regular diabetes screenings. This checks your fasting blood sugar level. Have the screening done:  Once every three years after age 69 if you are at a  normal weight and have a low risk for diabetes.  More often and at a younger age if you are overweight or have a high risk for diabetes. What should I know about preventing infection? Hepatitis B If you have a higher risk for hepatitis B, you should be screened for this virus. Talk with your health care provider to find out if you are at risk for hepatitis B infection. Hepatitis C Testing is recommended for:  Everyone born from [birth year] through 1967.  Anyone with known risk factors for hepatitis C. Sexually transmitted infections (STIs)  Get screened for STIs, including gonorrhea and chlamydia, if: ? You are sexually active and are younger than 55 years of age. ? You are older than 55 years of age and your health care provider tells you that you are at risk for this type of infection. ? Your sexual activity has changed since you were last screened, and you are at increased risk for chlamydia or gonorrhea. Ask your health care provider if you are at risk.  Ask your health care provider about whether you are at high risk for HIV. Your health care provider may recommend a prescription medicine to help prevent HIV infection. If you choose to take medicine to prevent HIV, you should first get tested for HIV. You should then be tested every 3 months for as long as you are taking the medicine. Pregnancy  If you are about to stop having your period (premenopausal) and you may become pregnant, seek counseling before you get pregnant.  Take 400 to 800 micrograms (mcg) of folic acid every day if you become pregnant.  Ask for birth control (contraception) if you want to prevent pregnancy. Osteoporosis and menopause Osteoporosis is a disease in which the bones lose minerals and strength with aging. This can result in bone fractures. If you are 51 years old or older, or if you are at risk for osteoporosis and fractures, ask your health care provider if you should:  Be screened for bone loss.  Take a  calcium or vitamin D supplement to lower your risk of fractures.  Be given hormone replacement therapy (HRT) to treat symptoms of menopause. Follow these instructions at home: Lifestyle  Do not use any products that contain nicotine or tobacco, such as cigarettes, e-cigarettes, and chewing tobacco. If you need help quitting, ask your health care provider.  Do not use street drugs.  Do not share needles.  Ask your health care provider for help if you need support or information about quitting drugs. Alcohol use  Do not drink alcohol if: ? Your health care provider tells you not to drink. ? You are pregnant, may be pregnant, or are planning to become pregnant.  If you drink alcohol: ? Limit how much you use to 0-1 drink a day. ? Limit intake if you are breastfeeding.  Be aware of how much alcohol is in your drink. In the  [place] one drink equals one 12 oz bottle of beer (355 mL), one 5 oz glass of wine (148 mL), or one 1 oz glass of hard liquor (44 mL). General instructions  Schedule regular health, dental, and eye exams.  Stay current with your vaccines.  Tell your health care provider if: ? You often feel depressed. ? You have ever been abused or do not feel safe at home. Summary  Adopting a healthy lifestyle and getting preventive care are important in promoting health and wellness.  Follow your health care provider's instructions about healthy diet, exercising, and getting tested or screened for diseases.  Follow your health care provider's instructions on monitoring your cholesterol and blood pressure. This information is not intended to replace advice given to you by your health care provider. Make sure you discuss any questions you have with your health care provider. Document Released: 07/14/2010 Document Revised: 12/22/2017 Document Reviewed: 12/22/2017 Elsevier Patient Education  2020 [employer]      Agustina Caroli, MD [facility]

## 2018-12-15 LAB — CBC WITH DIFFERENTIAL/PLATELET
Basophils Absolute: 0 10*3/uL (ref 0.0–0.2)
Basos: 1 %
EOS (ABSOLUTE): 0.1 10*3/uL (ref 0.0–0.4)
Eos: 3 %
Hematocrit: 41.9 % (ref 34.0–46.6)
Hemoglobin: 14.5 g/dL (ref 11.1–15.9)
Immature Grans (Abs): 0 10*3/uL (ref 0.0–0.1)
Immature Granulocytes: 0 %
Lymphocytes Absolute: 2.2 10*3/uL (ref 0.7–3.1)
Lymphs: 43 %
MCH: 30.3 pg (ref 26.6–33.0)
MCHC: 34.6 g/dL (ref 31.5–35.7)
MCV: 88 fL (ref 79–97)
Monocytes Absolute: 0.4 10*3/uL (ref 0.1–0.9)
Monocytes: 8 %
Neutrophils Absolute: 2.3 10*3/uL (ref 1.4–7.0)
Neutrophils: 45 %
Platelets: 293 10*3/uL (ref 150–450)
RBC: 4.78 x10E6/uL (ref 3.77–5.28)
RDW: 12.8 % (ref 11.7–15.4)
WBC: 5.1 10*3/uL (ref 3.4–10.8)

## 2018-12-15 LAB — LIPID PANEL
Chol/HDL Ratio: 3.7 ratio (ref 0.0–4.4)
Cholesterol, Total: 183 mg/dL (ref 100–199)
HDL: 49 mg/dL (ref >39)
LDL Chol Calc (NIH): 104 mg/dL — ABNORMAL HIGH (ref 0–99)
Triglycerides: 171 mg/dL — ABNORMAL HIGH (ref 0–149)
VLDL Cholesterol Cal: 30 mg/dL (ref 5–40)

## 2018-12-15 LAB — COMPREHENSIVE METABOLIC PANEL
ALT: 14 IU/L (ref 0–32)
AST: 14 IU/L (ref 0–40)
Albumin/Globulin Ratio: 1.6 (ref 1.2–2.2)
Albumin: 4.1 g/dL (ref 3.8–4.9)
Alkaline Phosphatase: 79 IU/L (ref 39–117)
BUN/Creatinine Ratio: 19 (ref 9–23)
BUN: 14 mg/dL (ref 6–24)
Bilirubin Total: 0.5 mg/dL (ref 0.0–1.2)
CO2: 22 mmol/L (ref 20–29)
Calcium: 9.2 mg/dL (ref 8.7–10.2)
Chloride: 105 mmol/L (ref 96–106)
Creatinine, Ser: 0.75 mg/dL (ref 0.57–1.00)
GFR calc Af Amer: 104 mL/min/{1.73_m2} (ref >59)
GFR calc non Af Amer: 90 mL/min/{1.73_m2} (ref >59)
Globulin, Total: 2.6 g/dL (ref 1.5–4.5)
Glucose: 166 mg/dL — ABNORMAL HIGH (ref 65–99)
Potassium: 4.8 mmol/L (ref 3.5–5.2)
Sodium: 139 mmol/L (ref 134–144)
Total Protein: 6.7 g/dL (ref 6.0–8.5)

## 2018-12-17 ENCOUNTER — Ambulatory Visit (INDEPENDENT_AMBULATORY_CARE_PROVIDER_SITE_OTHER): Payer: 59 | Admitting: Psychiatry

## 2018-12-17 ENCOUNTER — Other Ambulatory Visit: Payer: Self-pay

## 2018-12-17 ENCOUNTER — Encounter (HOSPITAL_COMMUNITY): Payer: Self-pay | Admitting: Psychiatry

## 2018-12-17 DIAGNOSIS — F4310 Post-traumatic stress disorder, unspecified: Secondary | ICD-10-CM

## 2018-12-17 DIAGNOSIS — F319 Bipolar disorder, unspecified: Secondary | ICD-10-CM | POA: Diagnosis not present
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
## 2018-12-28 ENCOUNTER — Encounter: Payer: Self-pay | Admitting: Orthopaedic Surgery

## 2018-12-28 ENCOUNTER — Ambulatory Visit (INDEPENDENT_AMBULATORY_CARE_PROVIDER_SITE_OTHER): Payer: Self-pay

## 2018-12-28 ENCOUNTER — Other Ambulatory Visit: Payer: Self-pay

## 2018-12-28 ENCOUNTER — Ambulatory Visit (INDEPENDENT_AMBULATORY_CARE_PROVIDER_SITE_OTHER): Payer: Self-pay | Admitting: Orthopaedic Surgery

## 2018-12-28 VITALS — Ht 66.0 in | Wt 182.0 lb

## 2018-12-28 DIAGNOSIS — M79641 Pain in right hand: Secondary | ICD-10-CM

## 2018-12-28 DIAGNOSIS — M65331 Trigger finger, right middle finger: Secondary | ICD-10-CM

## 2018-12-28 MED ORDER — METHYLPREDNISOLONE ACETATE 40 MG/ML IJ SUSP
20.0000 mg | INTRAMUSCULAR | Status: AC | PRN
  Administered 2018-12-28: 20 mg

## 2018-12-28 MED ORDER — LIDOCAINE HCL (PF) 1 % IJ SOLN
0.5000 mL | INTRAMUSCULAR | Status: AC | PRN
  Administered 2018-12-28: .5 mL

## 2018-12-28 NOTE — Progress Notes (Signed)
Office Visit Note   Patient: Kimberly [name]           Date of Birth: 1965-06-27           MRN: [id number] Visit Date: 12/28/2018              Requested by: Horald Pollen, MD [place],  [place] [id number] PCP: Horald Pollen, MD   Assessment & Plan: Visit Diagnoses:  1. Pain in right hand   2. Trigger finger, right middle finger     Plan: Kimberly [name] has a tenosynovitis of the flexor tendons of  right long finger .  No active triggering today.  Long discussion regarding diagnosis and treatment options.  I did inject the flexor tendon sheath at the A1 pulley level with Xylocaine and Depo-Medrol with near complete relief of her pain and symptoms.  Plan to see her back as needed.  Kimberly [name] also relates she has had a chronic problem with her right shoulder and was told by physicians in [place] where she previously resided that she had a rotator cuff problem.  She does have impingement.  I asked her to retrieve her old records including any x-rays and MRI scans that she may have had so that we could discuss the diagnosis and treatment options.  Follow-Up Instructions: Return if symptoms worsen or fail to improve.   Orders:  Orders Placed This Encounter  Procedures  . Hand/UE Inj: R long A1  . XR Hand Complete Right   No orders of the defined types were placed in this encounter.     Procedures: Hand/UE Inj: R long A1 for trigger finger on 12/28/2018 11:56 AM Medications: 0.5 mL lidocaine (PF) 1 %; 20 mg methylPREDNISolone acetate 40 MG/ML      Clinical Data: No additional findings.   Subjective: Chief Complaint  Patient presents with  . Right Hand - Pain  Patient presents today for right hand pain. She said that it has been hurting for one month. No known injury. The pain is located in her middle finger and states that it hurts on and off. She is unable to make a fist. She is right hand dominant. She takes Manufacturing engineer for her shoulder pain.  She just recently moved here from [place] and needed an orthopedic doctor.  HPI  Review of Systems   Objective: Vital Signs: Ht 5\' 6"  (1.676 m)   Wt 182 lb (82.6 kg)   BMI 29.38 kg/m   Physical Exam Constitutional:      Appearance: She is well-developed.  Eyes:     Pupils: Pupils are equal, round, and reactive to light.  Pulmonary:     Effort: Pulmonary effort is normal.  Skin:    General: Skin is warm and dry.  Neurological:     Mental Status: She is alert and oriented to person, place, and time.  Psychiatric:        Behavior: Behavior normal.     Ortho Exam awake alert and oriented x3.  Comfortable sitting.  Right hand with inability to fully actively or passively make a full fist specifically lacking full flexion about the MP joint of the long finger.  Other fingers were not symptomatic and could make a full fist.  No swelling about the MP PIP or DIP joint of the long finger.  There was tenderness along the A1 pulley level of the flexor tendon sheath.  No active triggering.  Motor and sensory exam intact Specialty Comments:  No  specialty comments available.  Imaging: XR Hand Complete Right  Result Date: 12/28/2018 Films of the right hand were obtained in several projections.  There were no abnormalities about the long finger where the patient is symptomatic except for some mild degenerative changes at the MP and PIP joints    PMFS History: Patient Active Problem List   Diagnosis Date Noted  . Trigger finger, right middle finger 12/28/2018  . Hyperlipidemia associated with type 2 diabetes mellitus ([place]) 05/06/2015  . Adjustment reaction with mixed emotional features 04/22/2015  . S/P CABG x 1 04/11/2015  . Unstable angina ([place])   . Hypertensive heart disease without heart failure   . Diabetes mellitus, type 2 ([place]) 04/06/2015  . Hyperlipidemia 04/06/2015  . ADHD (attention deficit hyperactivity disorder) 04/06/2015  . Tobacco abuse 04/06/2015  . Asthma  04/06/2015   Past Medical History:  Diagnosis Date  . Asthma   . CAD (coronary artery disease) 04/06/2015   CABG with LIMA-LAD  . Diabetes mellitus without complication ([place])   . Myocardial infarction ([place])    Approx 2014 per pt report, no further details available    Family History  Problem Relation Age of Onset  . Heart disease Other        grandfather  . Diabetes Other        mother    Past Surgical History:  Procedure Laterality Date  . ABDOMINAL HYSTERECTOMY    . APPENDECTOMY    . CARDIAC CATHETERIZATION N/A 04/08/2015   Procedure: Left Heart Cath and Coronary Angiography;  Surgeon: Jettie Booze, MD; LAD 95%, single vessel disease   . CORONARY ARTERY BYPASS GRAFT N/A 04/11/2015   Procedure: CORONARY ARTERY BYPASS GRAFTING (CABG) x one using left internal mamary artery. ;  Surgeon: Ivin Poot, MD;  LIMA-LAD  . TEE WITHOUT CARDIOVERSION N/A 04/11/2015   Procedure: TRANSESOPHAGEAL ECHOCARDIOGRAM (TEE);  Surgeon: Ivin Poot, MD;  Location: [facility];  Service: Open Heart Surgery;  Laterality: N/A;   Social History   Occupational History  . Not on file  Tobacco Use  . Smoking status: Former Smoker    Types: Cigarettes  . Smokeless tobacco: Never Used  Substance and Sexual Activity  . Alcohol use: Yes    Alcohol/week: 3.0 standard drinks    Types: 3 Glasses of wine per week    Comment: occasional  . Drug use: No  . Sexual activity: Not on file

## 2019-01-02 ENCOUNTER — Ambulatory Visit (INDEPENDENT_AMBULATORY_CARE_PROVIDER_SITE_OTHER): Payer: 59 | Admitting: Cardiovascular Disease

## 2019-01-02 ENCOUNTER — Encounter: Payer: Self-pay | Admitting: Cardiovascular Disease

## 2019-01-02 ENCOUNTER — Other Ambulatory Visit: Payer: Self-pay

## 2019-01-02 VITALS — BP 154/72 | HR 72 | Ht 65.5 in | Wt 184.0 lb

## 2019-01-02 DIAGNOSIS — I10 Essential (primary) hypertension: Secondary | ICD-10-CM | POA: Diagnosis not present

## 2019-01-02 DIAGNOSIS — Z720 Tobacco use: Secondary | ICD-10-CM | POA: Diagnosis not present

## 2019-01-02 DIAGNOSIS — E785 Hyperlipidemia, unspecified: Secondary | ICD-10-CM

## 2019-01-02 DIAGNOSIS — I25708 Atherosclerosis of coronary artery bypass graft(s), unspecified, with other forms of angina pectoris: Secondary | ICD-10-CM

## 2019-01-02 DIAGNOSIS — R002 Palpitations: Secondary | ICD-10-CM

## 2019-01-02 MED ORDER — LOSARTAN POTASSIUM 50 MG PO TABS: 50.0000 mg | ORAL_TABLET | Freq: Every day | ORAL | 3 refills | Status: DC

## 2019-01-02 MED ORDER — ASPIRIN EC 81 MG PO TBEC: 325.0000 mg | DELAYED_RELEASE_TABLET | Freq: Every day | ORAL | 3 refills | Status: DC

## 2019-01-02 MED ORDER — ROSUVASTATIN CALCIUM 20 MG PO TABS: 20.0000 mg | ORAL_TABLET | Freq: Every day | ORAL | 3 refills | Status: DC

## 2019-01-02 NOTE — Progress Notes (Signed)
CARDIOLOGY CONSULT NOTE  Patient ID: Kimberly [name] MRN: [id number] DOB/AGE: 55-Apr-1967 55 y.o.  Admit date: (Not on file) Primary Physician: Horald Pollen, MD  Reason for Consultation: Coronary artery disease  HPI: Kimberly [name] is a 53 y.o. female who is being seen today for the evaluation of coronary artery disease at the request of Horald Pollen, *.   She underwent one-vessel CABG with LIMA to the LAD on 04/11/2015.  Past medical history also includes hypertension, COPD, tobacco use, and insulin-dependent diabetes mellitus.  She is here with her daughter.  She denies exertional chest pain and dyspnea.  She does have palpitations.  She told me she underwent cardiac monitoring as well as a stress test and cardiac catheterization this year.  She required emergency vascular surgery of possibly the right femoral artery.  She has residual tingling and numbness since that surgery.  She denies leg swelling, orthopnea, and paroxysmal nocturnal dyspnea.  She smokes 6 to 7 cigarettes daily.  She tries to eat a sensible diet but she eats 1 day a week and eats fried chicken on those days.  She has been taking both losartan and lisinopril.   Allergies  Allergen Reactions  . Strattera [Atomoxetine Hcl] Other (See Comments)    Caused yeast infection per [facility] records  . Pyridium [Phenazopyridine Hcl] Itching    Current Outpatient Medications  Medication Sig Dispense Refill  . albuterol (PROVENTIL HFA;VENTOLIN HFA) 108 (90 Base) MCG/ACT inhaler Inhale 1 puff into the lungs every 6 (six) hours as needed for wheezing or shortness of breath. 54 g 3  . albuterol (PROVENTIL) (2.5 MG/3ML) 0.083% nebulizer solution Take 3 mLs (2.5 mg total) by nebulization every 6 (six) hours as needed for wheezing or shortness of breath. 75 mL 12  . amphetamine-dextroamphetamine (ADDERALL) 20 MG tablet Take 1 tablet (20 mg total) by mouth daily. 30 tablet 0  . aspirin 325 MG EC tablet  Take 1 tablet (325 mg total) by mouth daily. 30 tablet 0  . Blood Glucose Monitoring Suppl (TRUE METRIX METER) w/Device KIT Use as directed 1 kit 0  . carisoprodol (SOMA) 350 MG tablet     . Cholecalciferol (VITAMIN D3) 50 MCG (2000 UT) TABS Take by mouth.    . Fluticasone-Salmeterol (ADVAIR) 500-50 MCG/DOSE AEPB Inhale 1 puff into the lungs 2 (two) times daily. 180 each 3  . glucose blood (TRUE METRIX BLOOD GLUCOSE TEST) test strip Use as instructed 100 each 12  . HYDROcodone-acetaminophen (NORCO) 5-325 MG tablet Take 1 tablet by mouth every 6 (six) hours as needed for moderate pain. 40 tablet 0  . hydrOXYzine (ATARAX/VISTARIL) 25 MG tablet Take 1 tablet (25 mg total) by mouth 3 (three) times daily as needed for anxiety. 30 tablet 0  . insulin aspart (NOVOLOG) 100 UNIT/ML injection Inject into the skin 3 (three) times daily before meals. Sliding scale    . insulin glargine (LANTUS) 100 unit/mL SOPN Inject 0.3 mLs (30 Units total) into the skin at bedtime. 30 mL 3  . lisinopril (PRINIVIL,ZESTRIL) 5 MG tablet Take 1 tablet (5 mg total) by mouth daily. 30 tablet 3  . LORazepam (ATIVAN) 0.5 MG tablet Take 0.5 mg by mouth 2 (two) times daily.    Marland Kitchen losartan (COZAAR) 25 MG tablet Take 25 mg by mouth daily.    . Melatonin 3 MG TABS Take 1 tablet (3 mg total) by mouth at bedtime as needed. 30 tablet 0  . metFORMIN (GLUCOPHAGE) 500 MG tablet  Take by mouth 2 (two) times daily with a meal.    . methocarbamol (ROBAXIN) 500 MG tablet Take 2 tablets (1,000 mg total) by mouth 4 (four) times daily. X7 days then prn muscle spasm 120 tablet 0  . metoprolol (LOPRESSOR) 50 MG tablet Take 1 tablet (50 mg total) by mouth 2 (two) times daily. 60 tablet 3  . omeprazole (PRILOSEC) 40 MG capsule Take 40 mg by mouth daily.    . rosuvastatin (CRESTOR) 10 MG tablet Take 1 tablet (10 mg total) by mouth daily. 30 tablet 3  . traZODone (DESYREL) 100 MG tablet Take by mouth.    . TRUEPLUS LANCETS 28G MISC Use as directed 100  each 12   No current facility-administered medications for this visit.    Past Medical History:  Diagnosis Date  . Asthma   . CAD (coronary artery disease) 04/06/2015   CABG with LIMA-LAD  . Diabetes mellitus without complication ([place])   . Myocardial infarction ([place])    Approx 2014 per pt report, no further details available    Past Surgical History:  Procedure Laterality Date  . ABDOMINAL HYSTERECTOMY    . APPENDECTOMY    . CARDIAC CATHETERIZATION N/A 04/08/2015   Procedure: Left Heart Cath and Coronary Angiography;  Surgeon: Jettie Booze, MD; LAD 95%, single vessel disease   . CORONARY ARTERY BYPASS GRAFT N/A 04/11/2015   Procedure: CORONARY ARTERY BYPASS GRAFTING (CABG) x one using left internal mamary artery. ;  Surgeon: Ivin Poot, MD;  LIMA-LAD  . TEE WITHOUT CARDIOVERSION N/A 04/11/2015   Procedure: TRANSESOPHAGEAL ECHOCARDIOGRAM (TEE);  Surgeon: Ivin Poot, MD;  Location: [facility];  Service: Open Heart Surgery;  Laterality: N/A;    Social History   Socioeconomic History  . Marital status: Married    Spouse name: Not on file  . Number of children: Not on file  . Years of education: Not on file  . Highest education level: Not on file  Occupational History  . Not on file  Tobacco Use  . Smoking status: Current Every Day Smoker    Types: Cigarettes  . Smokeless tobacco: Never Used  Substance and Sexual Activity  . Alcohol use: Yes    Alcohol/week: 3.0 standard drinks    Types: 3 Glasses of wine per week    Comment: occasional  . Drug use: No  . Sexual activity: Not on file  Other Topics Concern  . Not on file  Social History Narrative  . Not on file   Social Determinants of Health   Financial Resource Strain:   . Difficulty of Paying Living Expenses: Not on file  Food Insecurity:   . Worried About Charity fundraiser in the Last Year: Not on file  . Ran Out of Food in the Last Year: Not on file  Transportation Needs:   . Lack of  Transportation (Medical): Not on file  . Lack of Transportation (Non-Medical): Not on file  Physical Activity:   . Days of Exercise per Week: Not on file  . Minutes of Exercise per Session: Not on file  Stress:   . Feeling of Stress : Not on file  Social Connections:   . Frequency of Communication with Friends and Family: Not on file  . Frequency of Social Gatherings with Friends and Family: Not on file  . Attends Religious Services: Not on file  . Active Member of Clubs or Organizations: Not on file  . Attends Archivist Meetings: Not on file  .  Marital Status: Not on file  Intimate Partner Violence:   . Fear of Current or Ex-Partner: Not on file  . Emotionally Abused: Not on file  . Physically Abused: Not on file  . Sexually Abused: Not on file     No family history of premature CAD in 1st degree relatives.  Current Meds  Medication Sig  . albuterol (PROVENTIL HFA;VENTOLIN HFA) 108 (90 Base) MCG/ACT inhaler Inhale 1 puff into the lungs every 6 (six) hours as needed for wheezing or shortness of breath.  Marland Kitchen albuterol (PROVENTIL) (2.5 MG/3ML) 0.083% nebulizer solution Take 3 mLs (2.5 mg total) by nebulization every 6 (six) hours as needed for wheezing or shortness of breath.  . amphetamine-dextroamphetamine (ADDERALL) 20 MG tablet Take 1 tablet (20 mg total) by mouth daily.  Marland Kitchen aspirin 325 MG EC tablet Take 1 tablet (325 mg total) by mouth daily.  . Blood Glucose Monitoring Suppl (TRUE METRIX METER) w/Device KIT Use as directed  . carisoprodol (SOMA) 350 MG tablet   . Cholecalciferol (VITAMIN D3) 50 MCG (2000 UT) TABS Take by mouth.  . Fluticasone-Salmeterol (ADVAIR) 500-50 MCG/DOSE AEPB Inhale 1 puff into the lungs 2 (two) times daily.  Marland Kitchen glucose blood (TRUE METRIX BLOOD GLUCOSE TEST) test strip Use as instructed  . HYDROcodone-acetaminophen (NORCO) 5-325 MG tablet Take 1 tablet by mouth every 6 (six) hours as needed for moderate pain.  . hydrOXYzine (ATARAX/VISTARIL) 25  MG tablet Take 1 tablet (25 mg total) by mouth 3 (three) times daily as needed for anxiety.  . insulin aspart (NOVOLOG) 100 UNIT/ML injection Inject into the skin 3 (three) times daily before meals. Sliding scale  . insulin glargine (LANTUS) 100 unit/mL SOPN Inject 0.3 mLs (30 Units total) into the skin at bedtime.  Marland Kitchen lisinopril (PRINIVIL,ZESTRIL) 5 MG tablet Take 1 tablet (5 mg total) by mouth daily.  Marland Kitchen LORazepam (ATIVAN) 0.5 MG tablet Take 0.5 mg by mouth 2 (two) times daily.  Marland Kitchen losartan (COZAAR) 25 MG tablet Take 25 mg by mouth daily.  . Melatonin 3 MG TABS Take 1 tablet (3 mg total) by mouth at bedtime as needed.  . metFORMIN (GLUCOPHAGE) 500 MG tablet Take by mouth 2 (two) times daily with a meal.  . methocarbamol (ROBAXIN) 500 MG tablet Take 2 tablets (1,000 mg total) by mouth 4 (four) times daily. X7 days then prn muscle spasm  . metoprolol (LOPRESSOR) 50 MG tablet Take 1 tablet (50 mg total) by mouth 2 (two) times daily.  Marland Kitchen omeprazole (PRILOSEC) 40 MG capsule Take 40 mg by mouth daily.  . rosuvastatin (CRESTOR) 10 MG tablet Take 1 tablet (10 mg total) by mouth daily.  . traZODone (DESYREL) 100 MG tablet Take by mouth.  . TRUEPLUS LANCETS 28G MISC Use as directed  . [DISCONTINUED] citalopram (CELEXA) 20 MG tablet Take 1 tablet (20 mg total) by mouth daily.  . [DISCONTINUED] diphenhydrAMINE (BENADRYL) 25 MG tablet Take by mouth.      Review of systems complete and found to be negative unless listed above in HPI    Physical exam Blood pressure (!) 154/72, pulse 72, height 5' 5.5" (1.664 m), weight 184 lb (83.5 kg), SpO2 98 %. General: NAD Neck: No JVD, no thyromegaly or thyroid nodule.  Lungs: Clear to auscultation bilaterally with normal respiratory effort. CV: Nondisplaced PMI. Regular rate and rhythm, normal S1/S2, no S3/S4, no murmur.  No peripheral edema.  No carotid bruit.    Abdomen: Soft, nontender, no distention.  Skin: Intact without lesions or rashes.  Neurologic: Alert  and oriented x 3.  Psych: Normal affect. Extremities: No clubbing or cyanosis.  HEENT: Normal.   ECG: Most recent ECG reviewed.   Labs: Lab Results  Component Value Date/Time   K 4.8 12/14/2018 11:46 AM   BUN 14 12/14/2018 11:46 AM   CREATININE 0.75 12/14/2018 11:46 AM   ALT 14 12/14/2018 11:46 AM   TSH 0.654 04/09/2015 02:50 AM   HGB 14.5 12/14/2018 11:46 AM     Lipids: Lab Results  Component Value Date/Time   LDLCALC 104 (H) 12/14/2018 11:46 AM   CHOL 183 12/14/2018 11:46 AM   TRIG 171 (H) 12/14/2018 11:46 AM   HDL 49 12/14/2018 11:46 AM       Echo: 04/08/2015 - Left ventricle: Distal septal hypokinesis The cavity size was  mildly dilated. Wall thickness was normal. Systolic function was  normal. The estimated ejection fraction was in the range of 50%  to 55%. Doppler parameters are consistent with elevated  ventricular end-diastolic filling pressure. - Aortic valve: There was mild regurgitation. - Atrial septum: No defect or patent foramen ovale was identified.  CATH: 04/08/2015  Ost LAD to Prox LAD lesion, 95% stenosed.  Severe tortuosity in the right subclavian made torquing catheters quite difficult. Would not use the right radial in the future for access site for cardiac cath. Severe ostial LAD stenosis. There is no landing zone for stent. Fixing this percutaneously would require leaving stents in the distal left main and putting a large ramus and circumflex at risk to be jailed. She would also likely have to be on lifelong clopidogrel. I think a better option would be to have a surgical consult for possible LIMA to LAD. She has a good target vessel in the mid to distal LAD.   ASSESSMENT AND PLAN:  1.  Coronary artery disease: She denies anginal symptoms.  Continue aspirin 81 mg and Lopressor 50 mg twice daily.  As LDL is not at goal, I will increase rosuvastatin to 20 mg.  I will discontinue lisinopril and increase losartan to 50 mg daily. I will obtain a  copy of stress test, event monitor, and cardiac catheterization report.  2.  Hypertension: Blood pressure is elevated.  She is taking both lisinopril and losartan.  I will increase losartan to 50 mg daily and discontinue lisinopril.  3.  Hyperlipidemia: LDL elevated at 104 on 12/14/2018.  I will increase rosuvastatin to 20 mg daily.  4.  Tobacco use: She is currently smoking 6 to 7 cigarettes daily.  5.  Palpitations: I will obtain a copy of event monitor performed earlier this year in [place].  Continue Lopressor 50 mg twice daily.   Disposition: Follow up in 6 months virtual visit  Signed: Kate Sable, M.D., F.A.C.C.  01/02/2019, 1:23 PM

## 2019-01-02 NOTE — Patient Instructions (Signed)
Medication Instructions:  DECREASE Aspirin to 81 mg daily  STOP Lisinopril  INCREASE Losartan to 50 mg daily   INCREASE Crestor to 20 mg daily   *If you need a refill on your cardiac medications before your next appointment, please call your pharmacy*  Lab Work: nonw today If you have labs (blood work) drawn today and your tests are completely normal, you will receive your results only by: Marland Kitchen MyChart Message (if you have MyChart) OR . A paper copy in the mail If you have any lab test  Follow-Up: At [facility], you and your health needs are our priority.  As part of our continuing mission to provide you with exceptional heart care, we have created designated Provider Care Teams.  These Care Teams include your primary Cardiologist (physician) and Advanced Practice Providers (APPs -  Physician Assistants and Nurse Practitioners) who all work together to provide you with the care you need, when you need it.  Your next appointment:   6 month(s)  The format for your next appointment:   Virtual Visit   Provider:   Kate Sable, MD  Other Instructions None      Thank you for choosing [facility] !

## 2019-01-16 ENCOUNTER — Encounter: Payer: Self-pay | Admitting: Emergency Medicine

## 2019-01-19 ENCOUNTER — Telehealth: Payer: Self-pay | Admitting: Emergency Medicine

## 2019-01-19 NOTE — Telephone Encounter (Signed)
insulin glargine (LANTUS) 100 unit/mL SOPN [id number]    Pt requesting for the medication that was refilled on 12/14/2018 to be resent to the pharmacy  [facility]   They are saying they never got it

## 2019-01-23 ENCOUNTER — Other Ambulatory Visit: Payer: Self-pay | Admitting: Emergency Medicine

## 2019-01-23 ENCOUNTER — Ambulatory Visit: Payer: Self-pay | Admitting: Cardiovascular Disease

## 2019-01-23 DIAGNOSIS — I152 Hypertension secondary to endocrine disorders: Secondary | ICD-10-CM

## 2019-01-23 DIAGNOSIS — E1159 Type 2 diabetes mellitus with other circulatory complications: Secondary | ICD-10-CM

## 2019-01-23 MED ORDER — INSULIN GLARGINE 100 UNITS/ML SOLOSTAR PEN: 30.0000 [IU] | PEN_INJECTOR | Freq: Every day | SUBCUTANEOUS | 3 refills | Status: DC

## 2019-01-23 NOTE — Telephone Encounter (Signed)
Rx sent to pharmacy   

## 2019-01-26 ENCOUNTER — Other Ambulatory Visit: Payer: Self-pay | Admitting: *Deleted

## 2019-01-26 DIAGNOSIS — E1159 Type 2 diabetes mellitus with other circulatory complications: Secondary | ICD-10-CM

## 2019-01-26 DIAGNOSIS — I152 Hypertension secondary to endocrine disorders: Secondary | ICD-10-CM

## 2019-01-26 MED ORDER — INSULIN GLARGINE 100 UNITS/ML SOLOSTAR PEN: 30.0000 [IU] | PEN_INJECTOR | Freq: Every day | SUBCUTANEOUS | 3 refills | Status: DC

## 2019-01-27 ENCOUNTER — Other Ambulatory Visit: Payer: Self-pay

## 2019-01-27 DIAGNOSIS — I152 Hypertension secondary to endocrine disorders: Secondary | ICD-10-CM

## 2019-01-27 DIAGNOSIS — E1159 Type 2 diabetes mellitus with other circulatory complications: Secondary | ICD-10-CM

## 2019-01-27 MED ORDER — INSULIN GLARGINE 100 UNITS/ML SOLOSTAR PEN: 30.0000 [IU] | PEN_INJECTOR | Freq: Every day | SUBCUTANEOUS | 3 refills | Status: DC

## 2019-01-27 NOTE — Telephone Encounter (Signed)
Patient calling back about this medication. She states pharmacy has not received.       [facility] [address] [place] Phone:  [phone]  Fax:  [phone]

## 2019-01-30 MED FILL — LANTUS 100 UNITS/ML VIAL: 100 | 84 days supply | Qty: 30 | Fill #0

## 2019-01-30 NOTE — Telephone Encounter (Signed)
Insulin glargine  30 ml with 3 refills sent 01/27/19.

## 2019-02-02 ENCOUNTER — Other Ambulatory Visit: Payer: Self-pay | Admitting: Emergency Medicine

## 2019-02-02 ENCOUNTER — Telehealth: Payer: Self-pay | Admitting: Emergency Medicine

## 2019-02-02 ENCOUNTER — Other Ambulatory Visit: Payer: Self-pay

## 2019-02-02 DIAGNOSIS — I152 Hypertension secondary to endocrine disorders: Secondary | ICD-10-CM

## 2019-02-02 DIAGNOSIS — E1159 Type 2 diabetes mellitus with other circulatory complications: Secondary | ICD-10-CM

## 2019-02-02 MED ORDER — NOVOLOG FLEXPEN 100 UNIT/ML ~~LOC~~ SOPN: PEN_INJECTOR | SUBCUTANEOUS | 11 refills | Status: DC

## 2019-02-02 MED ORDER — INSULIN GLARGINE 100 UNITS/ML SOLOSTAR PEN: 30.0000 [IU] | PEN_INJECTOR | Freq: Every day | SUBCUTANEOUS | 3 refills | Status: DC

## 2019-02-02 MED ORDER — INSULIN GLARGINE 100 UNIT/ML SOLOSTAR PEN: 60.0000 [IU] | PEN_INJECTOR | Freq: Every day | SUBCUTANEOUS | 6 refills | Status: DC

## 2019-02-02 NOTE — Telephone Encounter (Signed)
Pt is calling back patient still says pharmacy has not received prescription pat say number for pharmacy i1 [facility] is [phone] Only [facility] in [place] / please send all prescriptions to [facility] phone [phone] address [place] [phone] FR

## 2019-02-07 ENCOUNTER — Encounter: Payer: Self-pay | Admitting: Cardiovascular Disease

## 2019-02-15 ENCOUNTER — Telehealth: Payer: Self-pay | Admitting: Emergency Medicine

## 2019-02-15 NOTE — Telephone Encounter (Signed)
Patient was last seen by you on 12/14/18 and next appt with you on 06/13/19. Rx have never been filled by you. Please advise on refill request

## 2019-02-15 NOTE — Telephone Encounter (Signed)
Medication Refill - Medication:  aspirin 81 MG tablet LORazepam (ATIVAN) 0.5 MG tablet Cholecalciferol (VITAMIN D3) 50 MCG (2000 UT) TABS  Has the patient contacted their pharmacy? Yes advised to call office.  Preferred Pharmacy (with phone number or street name):  [facility], [place] Phone:  [phone]  Fax:  [phone]       Agent: Please be advised that RX refills may take up to 3 business days. We ask that you follow-up with your pharmacy.

## 2019-02-15 NOTE — Telephone Encounter (Signed)
Do not refill these.  Vitamin D and aspirin are over-the-counter.  Lorazepam should be refilled by the psychiatrist we referred her to.

## 2019-02-21 ENCOUNTER — Telehealth: Payer: Self-pay | Admitting: *Deleted

## 2019-02-21 ENCOUNTER — Encounter

## 2019-02-21 NOTE — Telephone Encounter (Signed)
Left a msg on machine that Dr Mitchel Honour do not do refill on pain meds and if she is in pain she can come into the office to be seen for the pain and if Dr feels you need to be on this med for an extended amount of time we can refer her to pain management.

## 2019-02-21 NOTE — Telephone Encounter (Signed)
I do not do refills on hydrocodone.  Thanks.

## 2019-02-21 NOTE — Telephone Encounter (Signed)
Patient stated she need her refill on her hydrocodone. Patient stated at last visit she mentioned she will need a refill on her pain med for her should cuff injury. Last seen 12/14/18.

## 2019-02-21 NOTE — Telephone Encounter (Addendum)
Pt called back, information relayed. She does not understand Best contact: [phone]  Pt is also requesting refill for HYDROcodone-acetaminophen [facility]) 5-325 MG tablet [facility] [address], [place]  [place] [address]  Phone: [phone] Fax: [phone]

## 2019-02-21 NOTE — Telephone Encounter (Signed)
Faxed sliding scale Rx total units requested by pharmacy for insurance to pay, total 60 units per Dr Mitchel Honour. Patient was call to find out how she does the sliding scale Information was written and attached to Rx request.

## 2019-02-22 ENCOUNTER — Telehealth: Payer: Self-pay | Admitting: *Deleted

## 2019-02-22 MED ORDER — LORAZEPAM 0.5 MG TAB
0.5 mg | ORAL_TABLET | ORAL | 0 refills | Status: AC
Start: 2019-02-22 — End: ?

## 2019-02-22 NOTE — Telephone Encounter (Signed)
On 02/21/2019, Rx with documentation of sliding scale for Novolog Flexpen 100 units/ml. Patient was contacted and per patient if glucose is 130 or lower (12 units) tid, glucose greater than 130-160 (14 units) tid,160-200 (16 units) tid and glucose greater than 220-240 (22 units).

## 2019-04-11 ENCOUNTER — Telehealth: Attending: Internal Medicine | Primary: Internal Medicine

## 2019-04-11 ENCOUNTER — Telehealth: Admit: 2019-04-11 | Payer: MEDICAID | Attending: Internal Medicine | Primary: Internal Medicine

## 2019-04-11 DIAGNOSIS — F419 Anxiety disorder, unspecified: Secondary | ICD-10-CM

## 2019-04-11 MED ORDER — LISINOPRIL 20 MG TAB
20 mg | ORAL_TABLET | Freq: Every day | ORAL | 3 refills | Status: AC
Start: 2019-04-11 — End: ?

## 2019-04-11 MED ORDER — LANTUS SOLOSTAR U-100 INSULIN 100 UNIT/ML (3 ML) SUBCUTANEOUS PEN
100 unit/mL (3 mL) | PEN_INJECTOR | Freq: Every evening | SUBCUTANEOUS | 3 refills | Status: AC
Start: 2019-04-11 — End: ?

## 2019-04-11 MED ORDER — INSULIN NEEDLES (DISPOSABLE) 31 X 5/16"
31 gauge x 5/16" | PACK | 11 refills | Status: AC
Start: 2019-04-11 — End: ?

## 2019-04-11 MED ORDER — METOPROLOL TARTRATE 50 MG TAB
50 mg | ORAL_TABLET | Freq: Two times a day (BID) | ORAL | 2 refills | Status: AC
Start: 2019-04-11 — End: ?

## 2019-04-11 MED ORDER — CYCLOBENZAPRINE 10 MG TAB
10 mg | ORAL_TABLET | Freq: Three times a day (TID) | ORAL | 0 refills | Status: AC | PRN
Start: 2019-04-11 — End: ?

## 2019-04-11 NOTE — Progress Notes (Signed)
Cathy [name] is a 55 y.o. female who was seen by synchronous (real-time) audio-video technology on 04/11/2019 for Diabetes and Cholesterol Problem        Assessment & Plan:   Diagnoses and all orders for this visit:    1. Anxiety    2. Coronary artery disease involving native coronary artery of native heart without angina pectoris    3. DM type 2, goal HbA1c < 7% (HCC)  -     CBC WITH AUTOMATED DIFF; Future  -     METABOLIC PANEL, COMPREHENSIVE; Future  -     HEMOGLOBIN A1C WITH EAG; Future  -     MICROALBUMIN, UR, RAND W/ MICROALB/CREAT RATIO; Future  -     VITAMIN D, 25 HYDROXY; Future  -     insulin glargine (Lantus Solostar U-100 Insulin) 100 unit/mL (3 mL) inpn; 60 Units by SubCUTAneous route nightly.  -     Insulin Needles, Disposable, 31 gauge x 5/16" ndle; Administer NOvolog 10 uniys day before each meal  -     metoprolol tartrate (LOPRESSOR) 50 mg tablet; Take 1 Tab by mouth two (2) times a day.    4. Hypercholesteremia    5. Essential hypertension  -     lisinopriL (PRINIVIL, ZESTRIL) 20 mg tablet; Take 1 Tab by mouth daily.    6. Primary osteoarthritis of right shoulder  -     cyclobenzaprine (FLEXERIL) 10 mg tablet; Take 1 Tab by mouth three (3) times daily as needed for Muscle Spasm(s).  -     REFERRAL TO ORTHOPEDICS    7. Vitamin D deficiency  -     VITAMIN D, 25 HYDROXY; Future        I spent at least 20 minutes on this visit with this established patient.    Subjective:       Prior to Admission medications    Medication Sig Start Date End Date Taking? Authorizing Provider   lisinopriL (PRINIVIL, ZESTRIL) 20 mg tablet Take 1 Tab by mouth daily. 04/11/19  Yes Marte Celani, Carloyn Jaeger, MD   insulin glargine (Lantus Solostar U-100 Insulin) 100 unit/mL (3 mL) inpn 60 Units by SubCUTAneous route nightly. 04/11/19  Yes Elnora Morrison, MD   Insulin Needles, Disposable, 31 gauge x 5/16" ndle Administer NOvolog 10 uniys day before each meal 04/11/19  Yes Rhyanna Sorce, Carloyn Jaeger, MD   metoprolol tartrate (LOPRESSOR)  50 mg tablet Take 1 Tab by mouth two (2) times a day. 04/11/19  Yes Faustine Tates, Carloyn Jaeger, MD   cyclobenzaprine (FLEXERIL) 10 mg tablet Take 1 Tab by mouth three (3) times daily as needed for Muscle Spasm(s). 04/11/19  Yes Neida Ellegood, Carloyn Jaeger, MD   LORazepam (ATIVAN) 0.5 mg tablet TAKE 1 TABLET BY MOUTH NIGHTLY AS NEEDED FOR ANXIETY. MAXIMUM OF 0.5MG DAILY. 02/22/19   Elnora Morrison, MD   rosuvastatin (CRESTOR) 10 mg tablet Take 1 Tab by mouth nightly. 11/21/18   Elnora Morrison, MD   albuterol (PROVENTIL VENTOLIN) 2.5 mg /3 mL (0.083 %) nebu Take 3 mL by inhalation every six (6) hours as needed for Wheezing. 10/30/18   Elnora Morrison, MD   cholecalciferol (VITAMIN D3) (2,000 UNITS /50 MCG) cap capsule Take 2,000 Units by mouth daily. 10/17/18   Elnora Morrison, MD   insulin aspart U-100 (NovoLOG Flexpen U-100 Insulin) 100 unit/mL (3 mL) inpn adminitser 10 units subcut three times daily before each meal 10/17/18   Elnora Morrison, MD   Nebulizer &  Compressor machine Albuterol nebulizer treatment  Albuterol 2.5 mg/5m every 6 hours prn 10/17/18   DElnora Morrison MD   Nebulizer & Compressor machine 1 Each by Does Not Apply route every four (4) hours as needed for Wheezing or Shortness of Breath. 10/17/18   DElnora Morrison MD   fluticasone propion-salmeteroL (ADVAIR/WIXELA) 250-50 mcg/dose diskus inhaler Take 1 Puff by inhalation every twelve (12) hours. 10/17/18   DElnora Morrison MD   metFORMIN (GLUCOPHAGE) 500 mg tablet Take 1 Tab by mouth two (2) times daily (with meals). 08/30/18   DElnora Morrison MD   albuterol (Proventil HFA) 90 mcg/actuation inhaler Take 1 Puff by inhalation every six (6) hours as needed for Wheezing. 08/30/18   DElnora Morrison MD   Insulin Needles, Disposable, 31 gauge x 5/16" ndle Administer NOvolog 10 uniys day before each meal 08/30/18 04/11/19  DElnora Morrison MD   insulin glargine (Lantus Solostar U-100 Insulin) 100 unit/mL (3 mL) inpn 60 Units by SubCUTAneous route  nightly. 08/30/18 04/11/19  DElnora Morrison MD   lisinopriL (PRINIVIL, ZESTRIL) 20 mg tablet Take 1 Tab by mouth daily. 08/30/18 04/11/19  DElnora Morrison MD   clobetasoL (TEMOVATE) 0.05 % topical cream Apply  to affected area two (2) times a day. 03/08/18   DElnora Morrison MD   escitalopram oxalate (LEXAPRO) 10 mg tablet Take 1 Tab by mouth daily. 03/08/18   DElnora Morrison MD   ammonium lactate (AMLACTIN) 12 % topical cream Apply  to affected area two (2) times a day. rub in to affected area well 03/08/18   DElnora Morrison MD   aspirin delayed-release 81 mg tablet Take 1 Tab by mouth daily. 03/08/18   DElnora Morrison MD   lurasidone (LATUDA) 20 mg tab tablet Take  by mouth.    Provider, Historical   lidocaine (LIDODERM) 5 % Apply to the back once daily 01/24/18   DElnora Morrison MD   Blood-Glucose Meter ([facility]TRA[facility] METER) monitoring kit Use as directed. 01/13/18   DElnora Morrison MD   metoprolol tartrate (LOPRESSOR) 50 mg tablet TAKE ONE TABLET BY MOUTH TWICE A DAY 01/06/18 04/11/19  DElnora Morrison MD   montelukast (SINGULAIR) 10 mg tablet Take 1 Tab by mouth daily. 12/16/17   DElnora Morrison MD   naloxone ([facility] 4 mg/actuation nasal spray Use 1 spray intranasally, then discard. Repeat with new spray every 2 min as needed for opioid overdose symptoms, alternating nostrils. 11/30/17   DElnora Morrison MD   diphenhydrAMINE (BENADRYL ALLERGY) 25 mg tablet Take 1 Tab by mouth nightly as needed for Sleep. 10/22/17   DElnora Morrison MD   carisoprodol (SOMA) 350 mg tablet  07/02/17 04/11/19  Provider, Historical   dextroamphetamine-amphetamine (ADDERALL) 20 mg tablet Take 20 mg by mouth two (2) times a day. 06/22/16   Provider, Historical   glucose blood VI test strips (TRUE METRIX GLUCOSE TEST STRIP) strip Use as instructed 07/12/15   Provider, Historical   lancets (TRUEPLUS LANCETS) 28 gauge misc Use as directed 07/12/15   Provider, Historical   traZODone (DESYREL) 100 mg tablet  Take 100 mg by mouth nightly as needed. 06/22/16   Provider, Historical     [place]    [place] and was told she has arthritis in her shoulder. She is requesting naproxen.HOwever she does have h/o CAD. She then rerquested a muscle relaxant  She also  needs refills on meds  Pt was last seen in August and has not had labs done  She is not sure how she is doing with regard to her DM.      ROS    Objective:   No flowsheet data found.     [INSTRUCTIONS:  "'[x]' " Indicates a positive item  "'[]' " Indicates a negative item  -- DELETE ALL ITEMS NOT EXAMINED]    Constitutional: '[x]'  Appears well-developed and well-nourished '[x]'  No apparent distress      '[]'  Abnormal -     Mental status: '[x]'  Alert and awake  '[x]'  Oriented to person/place/time '[x]'  Able to follow commands    '[]'  Abnormal -     Eyes:   EOM    '[x]'   Normal    '[]'  Abnormal -   Sclera  '[x]'   Normal    '[]'  Abnormal -          Discharge '[x]'   None visible   '[]'  Abnormal -     HENT: '[x]'  Normocephalic, atraumatic  '[]'  Abnormal -   '[x]'  Mouth/Throat: Mucous membranes are moist    External Ears '[x]'  Normal  '[]'  Abnormal -    Neck: '[x]'  No visualized mass '[]'  Abnormal -     Pulmonary/Chest: '[x]'  Respiratory effort normal   '[x]'  No visualized signs of difficulty breathing or respiratory distress        '[]'  Abnormal -      Musculoskeletal:   '[x]'  Normal gait with no signs of ataxia         '[x]'  Normal range of motion of neck        '[]'  Abnormal -     Neurological:        '[x]'  No Facial Asymmetry (Cranial nerve 7 motor function) (limited exam due to video visit)          '[x]'  No gaze palsy        '[]'  Abnormal -          Skin:        '[x]'  No significant exanthematous lesions or discoloration noted on facial skin         '[]'  Abnormal -            Psychiatric:       '[x]'  Normal Affect '[]'  Abnormal -        '[x]'  No Hallucinations    Other pertinent observable physical exam findings:-        We discussed the expected course, resolution and complications of the  diagnosis(es) in detail.  Medication risks, benefits, costs, interactions, and alternatives were discussed as indicated.  I advised her to contact the office if her condition worsens, changes or fails to improve as anticipated. She expressed understanding with the diagnosis(es) and plan.       Darnelle Maffucci, was evaluated through a synchronous (real-time) audio-video encounter. The patient (or guardian if applicable) is aware that this is a billable service. Verbal consent to proceed has been obtained within the past 12 months. The visit was conducted pursuant to the emergency declaration under the [place], [place] waiver authority and the [employer] Act.  Patient identification was verified, and a caregiver was present when appropriate. The patient was located in a state where the provider was credentialed to provide care.      Elnora Morrison, MD

## 2019-04-11 NOTE — Progress Notes (Signed)
Cathy [name] is a 54 y.o. female who was seen by synchronous (real-time) audio-video technology on 04/11/2019 for Diabetes and Cholesterol Problem        Assessment & Plan:   Diagnoses and all orders for this visit:    1. Anxiety    2. Coronary artery disease involving native coronary artery of native heart without angina pectoris    3. DM type 2, goal HbA1c < 7% (HCC)  -     CBC WITH AUTOMATED DIFF; Future  -     METABOLIC PANEL, COMPREHENSIVE; Future  -     HEMOGLOBIN A1C WITH EAG; Future  -     MICROALBUMIN, UR, RAND W/ MICROALB/CREAT RATIO; Future  -     VITAMIN D, 25 HYDROXY; Future  -     insulin glargine (Lantus Solostar U-100 Insulin) 100 unit/mL (3 mL) inpn; 60 Units by SubCUTAneous route nightly.  -     Insulin Needles, Disposable, 31 gauge x 5/16" ndle; Administer NOvolog 10 uniys day before each meal  -     metoprolol tartrate (LOPRESSOR) 50 mg tablet; Take 1 Tab by mouth two (2) times a day.    4. Hypercholesteremia    5. Essential hypertension  -     lisinopriL (PRINIVIL, ZESTRIL) 20 mg tablet; Take 1 Tab by mouth daily.    6. Primary osteoarthritis of right shoulder  -     cyclobenzaprine (FLEXERIL) 10 mg tablet; Take 1 Tab by mouth three (3) times daily as needed for Muscle Spasm(s).  -     REFERRAL TO ORTHOPEDICS    7. Vitamin D deficiency  -     VITAMIN D, 25 HYDROXY; Future        I spent at least 20 minutes on this visit with this established patient.    Subjective:       Prior to Admission medications    Medication Sig Start Date End Date Taking? Authorizing Provider   lisinopriL (PRINIVIL, ZESTRIL) 20 mg tablet Take 1 Tab by mouth daily. 04/11/19  Yes Latysha Thackston M, MD   insulin glargine (Lantus Solostar U-100 Insulin) 100 unit/mL (3 mL) inpn 60 Units by SubCUTAneous route nightly. 04/11/19  Yes Yakira Duquette M, MD   Insulin Needles, Disposable, 31 gauge x 5/16" ndle Administer NOvolog 10 uniys day before each meal 04/11/19  Yes Ravinder Hofland M, MD   metoprolol tartrate (LOPRESSOR)  50 mg tablet Take 1 Tab by mouth two (2) times a day. 04/11/19  Yes Kally Cadden M, MD   cyclobenzaprine (FLEXERIL) 10 mg tablet Take 1 Tab by mouth three (3) times daily as needed for Muscle Spasm(s). 04/11/19  Yes Dacey Milberger M, MD   LORazepam (ATIVAN) 0.5 mg tablet TAKE 1 TABLET BY MOUTH NIGHTLY AS NEEDED FOR ANXIETY. MAXIMUM OF 0.5MG DAILY. 02/22/19   Delilah Mulgrew M, MD   rosuvastatin (CRESTOR) 10 mg tablet Take 1 Tab by mouth nightly. 11/21/18   Shirely Toren M, MD   albuterol (PROVENTIL VENTOLIN) 2.5 mg /3 mL (0.083 %) nebu Take 3 mL by inhalation every six (6) hours as needed for Wheezing. 10/30/18   Leilan Bochenek M, MD   cholecalciferol (VITAMIN D3) (2,000 UNITS /50 MCG) cap capsule Take 2,000 Units by mouth daily. 10/17/18   Juley Giovanetti M, MD   insulin aspart U-100 (NovoLOG Flexpen U-100 Insulin) 100 unit/mL (3 mL) inpn adminitser 10 units subcut three times daily before each meal 10/17/18   Sebastian Lurz M, MD   Nebulizer &   Compressor machine Albuterol nebulizer treatment  Albuterol 2.5 mg/3ml every 6 hours prn 10/17/18   Mareo Portilla M, MD   Nebulizer & Compressor machine 1 Each by Does Not Apply route every four (4) hours as needed for Wheezing or Shortness of Breath. 10/17/18   Nayellie Sanseverino M, MD   fluticasone propion-salmeteroL (ADVAIR/WIXELA) 250-50 mcg/dose diskus inhaler Take 1 Puff by inhalation every twelve (12) hours. 10/17/18   Adrian Dinovo M, MD   metFORMIN (GLUCOPHAGE) 500 mg tablet Take 1 Tab by mouth two (2) times daily (with meals). 08/30/18   Pegi Milazzo M, MD   albuterol (Proventil HFA) 90 mcg/actuation inhaler Take 1 Puff by inhalation every six (6) hours as needed for Wheezing. 08/30/18   Melora Menon M, MD   Insulin Needles, Disposable, 31 gauge x 5/16" ndle Administer NOvolog 10 uniys day before each meal 08/30/18 04/11/19  Rodger Giangregorio M, MD   insulin glargine (Lantus Solostar U-100 Insulin) 100 unit/mL (3 mL) inpn 60 Units by SubCUTAneous route  nightly. 08/30/18 04/11/19  Daden Mahany M, MD   lisinopriL (PRINIVIL, ZESTRIL) 20 mg tablet Take 1 Tab by mouth daily. 08/30/18 04/11/19  Cameran Pettey M, MD   clobetasoL (TEMOVATE) 0.05 % topical cream Apply  to affected area two (2) times a day. 03/08/18   Eliska Hamil M, MD   escitalopram oxalate (LEXAPRO) 10 mg tablet Take 1 Tab by mouth daily. 03/08/18   Day Greb M, MD   ammonium lactate (AMLACTIN) 12 % topical cream Apply  to affected area two (2) times a day. rub in to affected area well 03/08/18   Dazhane Villagomez M, MD   aspirin delayed-release 81 mg tablet Take 1 Tab by mouth daily. 03/08/18   Malikah Lakey M, MD   lurasidone (LATUDA) 20 mg tab tablet Take  by mouth.    Provider, Historical   lidocaine (LIDODERM) 5 % Apply to the back once daily 01/24/18   Dasia Guerrier M, MD   Blood-Glucose Meter (ONETOUCH ULTRA2 METER) monitoring kit Use as directed. 01/13/18   Lundynn Cohoon M, MD   metoprolol tartrate (LOPRESSOR) 50 mg tablet TAKE ONE TABLET BY MOUTH TWICE A DAY 01/06/18 04/11/19  Duffy Dantonio M, MD   montelukast (SINGULAIR) 10 mg tablet Take 1 Tab by mouth daily. 12/16/17   Carolina Cohick M, MD   naloxone (NARCAN) 4 mg/actuation nasal spray Use 1 spray intranasally, then discard. Repeat with new spray every 2 min as needed for opioid overdose symptoms, alternating nostrils. 11/30/17   Claude Waldman M, MD   diphenhydrAMINE (BENADRYL ALLERGY) 25 mg tablet Take 1 Tab by mouth nightly as needed for Sleep. 10/22/17   Mazal Ebey M, MD   carisoprodol (SOMA) 350 mg tablet  07/02/17 04/11/19  Provider, Historical   dextroamphetamine-amphetamine (ADDERALL) 20 mg tablet Take 20 mg by mouth two (2) times a day. 06/22/16   Provider, Historical   glucose blood VI test strips (TRUE METRIX GLUCOSE TEST STRIP) strip Use as instructed 07/12/15   Provider, Historical   lancets (TRUEPLUS LANCETS) 28 gauge misc Use as directed 07/12/15   Provider, Historical   traZODone (DESYREL) 100 mg tablet  Take 100 mg by mouth nightly as needed. 06/22/16   Provider, Historical     HIstory    [place]. She says she saw ortho and was told she has arthritis in her shoulder. She is requesting naproxen.HOwever she does have h/o CAD. She then rerquested a muscle relaxant  She also   needs refills on meds  Pt was last seen in August and has not had labs done  She is not sure how she is doing with regard to her DM.      ROS    Objective:   No flowsheet data found.     [INSTRUCTIONS:  "[x]" Indicates a positive item  "[]" Indicates a negative item  -- DELETE ALL ITEMS NOT EXAMINED]    Constitutional: [x] Appears well-developed and well-nourished [x] No apparent distress      [] Abnormal -     Mental status: [x] Alert and awake  [x] Oriented to person/place/time [x] Able to follow commands    [] Abnormal -     Eyes:   EOM    [x]  Normal    [] Abnormal -   Sclera  [x]  Normal    [] Abnormal -          Discharge [x]  None visible   [] Abnormal -     HENT: [x] Normocephalic, atraumatic  [] Abnormal -   [x] Mouth/Throat: Mucous membranes are moist    External Ears [x] Normal  [] Abnormal -    Neck: [x] No visualized mass [] Abnormal -     Pulmonary/Chest: [x] Respiratory effort normal   [x] No visualized signs of difficulty breathing or respiratory distress        [] Abnormal -      Musculoskeletal:   [x] Normal gait with no signs of ataxia         [x] Normal range of motion of neck        [] Abnormal -     Neurological:        [x] No Facial Asymmetry (Cranial nerve 7 motor function) (limited exam due to video visit)          [x] No gaze palsy        [] Abnormal -          Skin:        [x] No significant exanthematous lesions or discoloration noted on facial skin         [] Abnormal -            Psychiatric:       [x] Normal Affect [] Abnormal -        [x] No Hallucinations    Other pertinent observable physical exam findings:-        We discussed the expected course, resolution and complications of the  diagnosis(es) in detail.  Medication risks, benefits, costs, interactions, and alternatives were discussed as indicated.  I advised her to contact the office if her condition worsens, changes or fails to improve as anticipated. She expressed understanding with the diagnosis(es) and plan.       Regina L Schonberger, was evaluated through a synchronous (real-time) audio-video encounter. The patient (or guardian if applicable) is aware that this is a billable service. Verbal consent to proceed has been obtained within the past 12 months. The visit was conducted pursuant to the emergency declaration under the [place] Act and the [place] Emergencies Act, 1135 waiver authority and the Coronavirus Preparedness and Response Supplemental Appropriations Act.  Patient identification was verified, and a caregiver was present when appropriate. The patient was located in a state where the provider was credentialed to provide care.      Keta [place] M Dontay Harm, MD

## 2019-06-01 DIAGNOSIS — K219 Gastro-esophageal reflux disease without esophagitis: Secondary | ICD-10-CM | POA: Diagnosis not present

## 2019-06-01 DIAGNOSIS — E785 Hyperlipidemia, unspecified: Secondary | ICD-10-CM | POA: Diagnosis not present

## 2019-06-01 DIAGNOSIS — E0843 Diabetes mellitus due to underlying condition with diabetic autonomic (poly)neuropathy: Secondary | ICD-10-CM | POA: Diagnosis not present

## 2019-06-01 DIAGNOSIS — I10 Essential (primary) hypertension: Secondary | ICD-10-CM | POA: Diagnosis not present

## 2019-06-05 DIAGNOSIS — S60454A Superficial foreign body of right ring finger, initial encounter: Secondary | ICD-10-CM | POA: Diagnosis not present

## 2019-06-05 DIAGNOSIS — S61244A Puncture wound with foreign body of right ring finger without damage to nail, initial encounter: Secondary | ICD-10-CM | POA: Diagnosis not present

## 2019-06-05 DIAGNOSIS — W458XXA Other foreign body or object entering through skin, initial encounter: Secondary | ICD-10-CM | POA: Diagnosis not present

## 2019-06-13 ENCOUNTER — Ambulatory Visit: Payer: Commercial Managed Care - HMO | Admitting: Emergency Medicine

## 2019-06-16 DIAGNOSIS — H5213 Myopia, bilateral: Secondary | ICD-10-CM | POA: Diagnosis not present

## 2019-06-16 DIAGNOSIS — H47092 Other disorders of optic nerve, not elsewhere classified, left eye: Secondary | ICD-10-CM | POA: Diagnosis not present

## 2019-06-16 DIAGNOSIS — E11319 Type 2 diabetes mellitus with unspecified diabetic retinopathy without macular edema: Secondary | ICD-10-CM | POA: Diagnosis not present

## 2019-07-03 NOTE — Progress Notes (Deleted)
Cardiology Office Note  Date: 07/03/2019   ID: Kimberly [name], DOB 17-Oct-1965, MRN [id number]  PCP:  Horald Pollen, MD  Cardiologist:  Kate Sable, MD Electrophysiologist:  None   Chief Complaint:  follow-up CAD, HTN, HLD, tobacco call abuse, palpitations  History of Present Illness: Kimberly [name] is a 54 y.o. female with a history of CAD (one-vessel CABG with LIMA to LAD 04/11/2015, HTN, HLD, tobacco abuse, COPD, IDDM, palpitations.  Last visit with Dr. Bronson Ing 01/02/2019.  She denied any exertional chest pain or dyspnea.  She did complain of palpitations.  Stated she had a stress test and cardiac catheterization   She required emergency vascular surgery of right femoral artery.  She smokes 6 to 7 cigarettes/day.  Crestor was increased to 20 mg.  She was on both lisinopril and losartan at last visit.  Lisinopril was discontinued and losartan was increased to 50 mg daily.  Plans were to attempt to obtain a copy of stress test, event monitor and cardiac catheterization report.  Her blood pressure was elevated at last visit.  As mentioned above her losartan was increased to 50 mg Lopressor was increased to 50 mg p.o. twice daily.  LDL was not at goal.  Crestor was increased to 20 mg daily  Past Medical History:  Diagnosis Date  . Asthma   . CAD (coronary artery disease) 04/06/2015   CABG with LIMA-LAD  . Diabetes mellitus without complication ([place])   . Myocardial infarction ([place])    Approx 2014 per pt report, no further details available    Past Surgical History:  Procedure Laterality Date  . ABDOMINAL HYSTERECTOMY    . APPENDECTOMY    . CARDIAC CATHETERIZATION N/A 04/08/2015   Procedure: Left Heart Cath and Coronary Angiography;  Surgeon: Jettie Booze, MD; LAD 95%, single vessel disease   . CORONARY ARTERY BYPASS GRAFT N/A 04/11/2015   Procedure: CORONARY ARTERY BYPASS GRAFTING (CABG) x one using left internal mamary artery. ;  Surgeon: Ivin Poot, MD;   LIMA-LAD  . TEE WITHOUT CARDIOVERSION N/A 04/11/2015   Procedure: TRANSESOPHAGEAL ECHOCARDIOGRAM (TEE);  Surgeon: Ivin Poot, MD;  Location: [facility];  Service: Open Heart Surgery;  Laterality: N/A;    Current Outpatient Medications  Medication Sig Dispense Refill  . albuterol (PROVENTIL HFA;VENTOLIN HFA) 108 (90 Base) MCG/ACT inhaler Inhale 1 puff into the lungs every 6 (six) hours as needed for wheezing or shortness of breath. 54 g 3  . albuterol (PROVENTIL) (2.5 MG/3ML) 0.083% nebulizer solution Take 3 mLs (2.5 mg total) by nebulization every 6 (six) hours as needed for wheezing or shortness of breath. 75 mL 12  . amphetamine-dextroamphetamine (ADDERALL) 20 MG tablet Take 1 tablet (20 mg total) by mouth daily. 30 tablet 0  . aspirin 81 MG tablet Take 4 tablets (325 mg total) by mouth daily. 90 tablet 3  . Blood Glucose Monitoring Suppl (TRUE METRIX METER) w/Device KIT Use as directed 1 kit 0  . carisoprodol (SOMA) 350 MG tablet     . Cholecalciferol (VITAMIN D3) 50 MCG (2000 UT) TABS Take by mouth.    . Fluticasone-Salmeterol (ADVAIR) 500-50 MCG/DOSE AEPB Inhale 1 puff into the lungs 2 (two) times daily. 180 each 3  . glucose blood (TRUE METRIX BLOOD GLUCOSE TEST) test strip Use as instructed 100 each 12  . HYDROcodone-acetaminophen (NORCO) 5-325 MG tablet Take 1 tablet by mouth every 6 (six) hours as needed for moderate pain. 40 tablet 0  . hydrOXYzine (ATARAX/VISTARIL) 25 MG tablet  Take 1 tablet (25 mg total) by mouth 3 (three) times daily as needed for anxiety. 30 tablet 0  . insulin aspart (NOVOLOG FLEXPEN) 100 UNIT/ML FlexPen As per sliding scale. 15 mL 11  . Insulin Glargine (LANTUS) 100 UNIT/ML Solostar Pen Inject 60 Units into the skin at bedtime. 15 pen 6  . LORazepam (ATIVAN) 0.5 MG tablet Take 0.5 mg by mouth 2 (two) times daily.    Marland Kitchen losartan (COZAAR) 50 MG tablet Take 1 tablet (50 mg total) by mouth daily. 90 tablet 3  . Melatonin 3 MG TABS Take 1 tablet (3 mg total) by mouth  at bedtime as needed. 30 tablet 0  . metFORMIN (GLUCOPHAGE) 500 MG tablet Take by mouth 2 (two) times daily with a meal.    . methocarbamol (ROBAXIN) 500 MG tablet Take 2 tablets (1,000 mg total) by mouth 4 (four) times daily. X7 days then prn muscle spasm 120 tablet 0  . metoprolol (LOPRESSOR) 50 MG tablet Take 1 tablet (50 mg total) by mouth 2 (two) times daily. 60 tablet 3  . omeprazole (PRILOSEC) 40 MG capsule Take 40 mg by mouth daily.    . rosuvastatin (CRESTOR) 20 MG tablet Take 1 tablet (20 mg total) by mouth daily. 90 tablet 3  . traZODone (DESYREL) 100 MG tablet Take by mouth.    . TRUEPLUS LANCETS 28G MISC Use as directed 100 each 12   No current facility-administered medications for this visit.   Allergies:  Strattera [atomoxetine hcl] and Pyridium [phenazopyridine hcl]   Social History: The patient  reports that she has been smoking cigarettes. She has never used smokeless tobacco. She reports current alcohol use of about 3.0 standard drinks of alcohol per week. She reports that she does not use drugs.   Family History: The patient's family history includes Diabetes in an other family member; Heart disease in an other family member.   ROS:  Please see the history of present illness. Otherwise, complete review of systems is positive for none.  All other systems are reviewed and negative.   Physical Exam: VS:  There were no vitals taken for this visit., BMI There is no height or weight on file to calculate BMI.   Wt Readings from Last 3 Encounters:  01/02/19 184 lb (83.5 kg)  12/28/18 182 lb (82.6 kg)  12/14/18 181 lb (82.1 kg)    General: Patient appears comfortable at rest. HEENT: Conjunctiva and lids normal, oropharynx clear with moist mucosa. Neck: Supple, no elevated JVP or carotid bruits, no thyromegaly. Lungs: Clear to auscultation, nonlabored breathing at rest. Cardiac: Regular rate and rhythm, no S3 or significant systolic murmur, no pericardial rub. Abdomen:  Soft, nontender, no hepatomegaly, bowel sounds present, no guarding or rebound. Extremities: No pitting edema, distal pulses 2+. Skin: Warm and dry. Musculoskeletal: No kyphosis. Neuropsychiatric: Alert and oriented x3, affect grossly appropriate.  ECG:  {EKG/Telemetry Strips Reviewed:[phone]}  Recent Labwork: 12/14/2018: ALT 14; AST 14; BUN 14; Creatinine, Ser 0.75; Hemoglobin 14.5; Platelets 293; Potassium 4.8; Sodium 139     Component Value Date/Time   CHOL 183 12/14/2018 1146   TRIG 171 (H) 12/14/2018 1146   HDL 49 12/14/2018 1146   CHOLHDL 3.7 12/14/2018 1146   CHOLHDL 4.3 04/06/2015 1816   VLDL 53 (H) 04/06/2015 1816   LDLCALC 104 (H) 12/14/2018 1146    Other Studies Reviewed Today:   Echo: 04/08/2015 - Left ventricle: Distal septal hypokinesis The cavity size was  mildly dilated. Wall thickness was normal. Systolic function  was  normal. The estimated ejection fraction was in the range of 50%  to 55%. Doppler parameters are consistent with elevated  ventricular end-diastolic filling pressure. - Aortic valve: There was mild regurgitation. - Atrial septum: No defect or patent foramen ovale was identified.  CATH: 04/08/2015  Ost LAD to Prox LAD lesion, 95% stenosed.  Severe tortuosity in the right subclavian made torquing catheters quite difficult. Would not use the right radial in the future for access site for cardiac cath. Severe ostial LAD stenosis. There is no landing zone for stent. Fixing this percutaneously would require leaving stents in the distal left main and putting a large ramus and circumflex at risk to be jailed. She would also likely have to be on lifelong clopidogrel. I think a better option would be to have a surgical consult for possible LIMA to LAD. She has a good target vessel in the mid to distal LAD.  Assessment and Plan:  1. CAD in native artery   2. Essential hypertension   3. Hyperlipidemia LDL goal <70   4. Tobacco use   5.  Palpitations    1. CAD in native artery ***  2. Essential hypertension ***  3. Hyperlipidemia LDL goal <70 ***  4. Tobacco use ***  5. Palpitations ***   Medication Adjustments/Labs and Tests Ordered: Current medicines are reviewed at length with the patient today.  Concerns regarding medicines are outlined above.   Disposition: Follow-up with ***  Signed, Levell July, NP 07/03/2019 10:17 PM    [facility] at [facility] [place], [place], [place] [address] Phone: [phone]; Fax: [phone]

## 2019-07-04 ENCOUNTER — Ambulatory Visit: Payer: Medicare Other | Admitting: Family Medicine

## 2019-07-13 ENCOUNTER — Ambulatory Visit: Payer: Medicare Other | Admitting: Family Medicine

## 2019-07-13 NOTE — Progress Notes (Addendum)
Cardiology Office Note  Date: 07/14/2019   ID: Kimberly [name], DOB 1965/02/21, MRN [id number]  PCP:  Horald Pollen, MD  Cardiologist:  Kate Sable, MD Electrophysiologist:  None   Chief Complaint:  follow-up CAD, HTN, HLD, tobacco call abuse, palpitations  History of Present Illness: Kimberly [name] is a 54 y.o. female with a history of CAD (one-vessel CABG with LIMA to LAD 04/11/2015, HTN, HLD, tobacco abuse, COPD, IDDM, palpitations.  Last visit with Dr. Bronson Ing 01/02/2019.  She denied any exertional chest pain or dyspnea.  She did complain of palpitations.  Stated she had a stress test and cardiac catheterization   She required emergency vascular surgery of right femoral artery.  She smokes 6 to 7 cigarettes/day.  Crestor was increased to 20 mg.  She was on both lisinopril and losartan at last visit.  Lisinopril was discontinued and losartan was increased to 50 mg daily.  Plans were to attempt to obtain a copy of stress test, event monitor and cardiac catheterization report.  Her blood pressure was elevated at last visit.  As mentioned above her losartan was increased to 50 mg Lopressor was increased to 50 mg p.o. twice daily.  LDL was not at goal.  Crestor was increased to 20 mg daily  Presents today with some complaints of epigastric pain which radiates around under her left rib cage.  Occurs without exertion and with exertion.  States it has no particular pattern.  Denies any radiation to neck, arms, back, jaw.  Denies any associated nausea, vomiting, or diaphoresis.  She states she has been told she needed to see a gastroenterologist in the past.  She does have some reflux symptoms.  She denies the pain occurs after eating.  Also complaining of some lower leg pain when walking specifically in the calves and thighs which is relieved with rest.  Otherwise she denies any progressive anginal or exertional symptoms, palpitations or arrhythmias, orthostatic symptoms, stroke or  TIA-like symptoms, lower extremity edema.   Past Medical History:  Diagnosis Date  . Asthma   . CAD (coronary artery disease) 04/06/2015   CABG with LIMA-LAD  . Diabetes mellitus without complication ([place])   . Myocardial infarction ([place])    Approx 2014 per pt report, no further details available    Past Surgical History:  Procedure Laterality Date  . ABDOMINAL HYSTERECTOMY    . APPENDECTOMY    . CARDIAC CATHETERIZATION N/A 04/08/2015   Procedure: Left Heart Cath and Coronary Angiography;  Surgeon: Jettie Booze, MD; LAD 95%, single vessel disease   . CORONARY ARTERY BYPASS GRAFT N/A 04/11/2015   Procedure: CORONARY ARTERY BYPASS GRAFTING (CABG) x one using left internal mamary artery. ;  Surgeon: Ivin Poot, MD;  LIMA-LAD  . TEE WITHOUT CARDIOVERSION N/A 04/11/2015   Procedure: TRANSESOPHAGEAL ECHOCARDIOGRAM (TEE);  Surgeon: Ivin Poot, MD;  Location: [facility];  Service: Open Heart Surgery;  Laterality: N/A;    Current Outpatient Medications  Medication Sig Dispense Refill  . albuterol (PROVENTIL HFA;VENTOLIN HFA) 108 (90 Base) MCG/ACT inhaler Inhale 1 puff into the lungs every 6 (six) hours as needed for wheezing or shortness of breath. 54 g 3  . albuterol (PROVENTIL) (2.5 MG/3ML) 0.083% nebulizer solution Take 3 mLs (2.5 mg total) by nebulization every 6 (six) hours as needed for wheezing or shortness of breath. 75 mL 12  . amphetamine-dextroamphetamine (ADDERALL) 20 MG tablet Take 1 tablet (20 mg total) by mouth daily. 30 tablet 0  . aspirin 81 MG  tablet Take 4 tablets (325 mg total) by mouth daily. 90 tablet 3  . Blood Glucose Monitoring Suppl (TRUE METRIX METER) w/Device KIT Use as directed 1 kit 0  . Cholecalciferol (VITAMIN D3) 50 MCG (2000 UT) TABS Take by mouth.    . Fluticasone-Salmeterol (ADVAIR) 500-50 MCG/DOSE AEPB Inhale 1 puff into the lungs 2 (two) times daily. 180 each 3  . glucose blood (TRUE METRIX BLOOD GLUCOSE TEST) test strip Use as instructed 100  each 12  . hydrOXYzine (ATARAX/VISTARIL) 25 MG tablet Take 1 tablet (25 mg total) by mouth 3 (three) times daily as needed for anxiety. 30 tablet 0  . insulin aspart (NOVOLOG FLEXPEN) 100 UNIT/ML FlexPen As per sliding scale. 15 mL 11  . Insulin Glargine (LANTUS) 100 UNIT/ML Solostar Pen Inject 60 Units into the skin at bedtime. 15 pen 6  . LORazepam (ATIVAN) 0.5 MG tablet Take 0.5 mg by mouth 2 (two) times daily.    Marland Kitchen losartan (COZAAR) 50 MG tablet Take 1 tablet (50 mg total) by mouth daily. 90 tablet 3  . Melatonin 3 MG TABS Take 1 tablet (3 mg total) by mouth at bedtime as needed. 30 tablet 0  . metFORMIN (GLUCOPHAGE) 500 MG tablet Take by mouth 2 (two) times daily with a meal.    . methocarbamol (ROBAXIN) 500 MG tablet Take 2 tablets (1,000 mg total) by mouth 4 (four) times daily. X7 days then prn muscle spasm 120 tablet 0  . metoprolol (LOPRESSOR) 50 MG tablet Take 1 tablet (50 mg total) by mouth 2 (two) times daily. 60 tablet 3  . omeprazole (PRILOSEC) 40 MG capsule Take 40 mg by mouth daily.    . rosuvastatin (CRESTOR) 20 MG tablet Take 1 tablet (20 mg total) by mouth daily. 90 tablet 3  . traZODone (DESYREL) 100 MG tablet Take by mouth.    . TRUEPLUS LANCETS 28G MISC Use as directed 100 each 12   No current facility-administered medications for this visit.   Allergies:  Strattera [atomoxetine hcl] and Pyridium [phenazopyridine hcl]   Social History: The patient  reports that she has been smoking cigarettes. She has never used smokeless tobacco. She reports current alcohol use of about 3.0 standard drinks of alcohol per week. She reports that she does not use drugs.   Family History: The patient's family history includes Diabetes in an other family member; Heart disease in an other family member.   ROS:  Please see the history of present illness. Otherwise, complete review of systems is positive for none.  All other systems are reviewed and negative.   Physical Exam: VS:  Ht '5\' 5"'$   (1.651 m)   Wt 178 lb (80.7 kg)   BMI 29.62 kg/m , BMI Body mass index is 29.62 kg/m.   Wt Readings from Last 3 Encounters:  07/14/19 178 lb (80.7 kg)  01/02/19 184 lb (83.5 kg)  12/28/18 182 lb (82.6 kg)    General: Patient appears comfortable at rest. Neck: Supple, no elevated JVP or carotid bruits, no thyromegaly. Lungs: Clear to auscultation, nonlabored breathing at rest. Cardiac: Regular rate and rhythm, no S3 or significant systolic murmur, no pericardial rub. Extremities: No pitting edema, distal pulses 2+. Skin: Warm and dry. Musculoskeletal: No kyphosis. Neuropsychiatric: Alert and oriented x3, affect grossly appropriate.  ECG:    Recent Labwork: 12/14/2018: ALT 14; AST 14; BUN 14; Creatinine, Ser 0.75; Hemoglobin 14.5; Platelets 293; Potassium 4.8; Sodium 139     Component Value Date/Time   CHOL 183 12/14/2018  1146   TRIG 171 (H) 12/14/2018 1146   HDL 49 12/14/2018 1146   CHOLHDL 3.7 12/14/2018 1146   CHOLHDL 4.3 04/06/2015 1816   VLDL 53 (H) 04/06/2015 1816   LDLCALC 104 (H) 12/14/2018 1146    Other Studies Reviewed Today:  Cardiac catheterization 11/18/2017 conclusions: Obstructive native one-vessel coronary artery disease. Status post CABG with one-to-one grafts patent. Preserved left ventricular systolic function with regional wall motion as above.  Angio-Seal closure of right common femoral artery.  Echo: 04/08/2015 - Left ventricle: Distal septal hypokinesis The cavity size was  mildly dilated. Wall thickness was normal. Systolic function was  normal. The estimated ejection fraction was in the range of 50%  to 55%. Doppler parameters are consistent with elevated  ventricular end-diastolic filling pressure. - Aortic valve: There was mild regurgitation. - Atrial septum: No defect or patent foramen ovale was identified.  CATH: 04/08/2015  Ost LAD to Prox LAD lesion, 95% stenosed.  Severe tortuosity in the right subclavian made torquing  catheters quite difficult. Would not use the right radial in the future for access site for cardiac cath. Severe ostial LAD stenosis. There is no landing zone for stent. Fixing this percutaneously would require leaving stents in the distal left main and putting a large ramus and circumflex at risk to be jailed. She would also likely have to be on lifelong clopidogrel. I think a better option would be to have a surgical consult for possible LIMA to LAD. She has a good target vessel in the mid to distal LAD.  Assessment and Plan:   1. CAD in native artery Denies any anginal or exertional symptoms.  Continue aspirin 81 mg.  Metoprolol 50 mg p.o. twice daily.  2. Essential hypertension She is normotensive today with a blood pressure of 122/68.  Continue losartan 50 mg daily.  3. Hyperlipidemia LDL goal <70 Continue Crestor 20 mg daily.  Last LDL 7 months ago 104.  Goal is less than 70.  Given concurrent history of diabetes we need to encourage dose of Crestor.  Increase Crestor dosage to 40 mg daily.  4. Tobacco use Patient states she has cut way back on cigarettes.  She is occasionally vaping.  Advised cessation if possible  5. Palpitations States she has occasional palpitations but not bothersome and very sporadic  6. Epigastric pain Patient describes epigastric pain/left below the rib cage and axillary region.  Not associated with activity.  No aggravating or alleviating factors.  No associated nausea, vomiting, or diaphoresis.  No radiation.  States she was told before she has gastric reflux symptoms and needed to see a GI specialist.  Please refer to Dr. Laural Golden  7.  Leg pain Complaining of increased leg pain and thighs and calves when walking.  Get a lower arterial duplex with ABIs to check for PAD.   Medication Adjustments/Labs and Tests Ordered: Current medicines are reviewed at length with the patient today.  Concerns regarding medicines are outlined above.   Disposition:  Follow-up with cardiology or APP 6 months  Signed, Levell July, NP 07/14/2019 8:30 AM    [facility] at [place], [place], [place] [address] Phone: [phone]; Fax: [phone]

## 2019-07-14 ENCOUNTER — Other Ambulatory Visit: Payer: Self-pay

## 2019-07-14 ENCOUNTER — Other Ambulatory Visit: Payer: Self-pay | Admitting: *Deleted

## 2019-07-14 ENCOUNTER — Ambulatory Visit (INDEPENDENT_AMBULATORY_CARE_PROVIDER_SITE_OTHER): Payer: Medicare Other | Admitting: Family Medicine

## 2019-07-14 ENCOUNTER — Encounter: Payer: Self-pay | Admitting: Family Medicine

## 2019-07-14 VITALS — BP 122/68 | HR 55 | Ht 65.0 in | Wt 178.0 lb

## 2019-07-14 DIAGNOSIS — R109 Unspecified abdominal pain: Secondary | ICD-10-CM

## 2019-07-14 DIAGNOSIS — M79606 Pain in leg, unspecified: Secondary | ICD-10-CM | POA: Diagnosis not present

## 2019-07-14 DIAGNOSIS — E785 Hyperlipidemia, unspecified: Secondary | ICD-10-CM

## 2019-07-14 MED ORDER — ROSUVASTATIN CALCIUM 40 MG PO TABS
40.0000 mg | ORAL_TABLET | Freq: Every day | ORAL | 1 refills | Status: DC
Start: 2019-07-14 — End: 2019-09-15

## 2019-07-14 NOTE — Patient Instructions (Addendum)
Your physician wants you to follow-up in: [place] will receive a reminder letter in the mail two months in advance. If you don't receive a letter, please call our office to schedule the follow-up appointment.  Your physician has recommended you make the following change in your medication:   [place] has requested that you have a lower extremity arterial exercise duplex. During this test, exercise and ultrasound are used to evaluate arterial blood flow in the legs. Allow one hour for this exam. There are no restrictions or special instructions.  Your physician has requested that you have an ankle brachial index (ABI). During this test an ultrasound and blood pressure cuff are used to evaluate the arteries that supply the arms and legs with blood. Allow thirty minutes for this exam. There are no restrictions or special instructions.  You have been referred to DR [facility]   Thank you for choosing [facility]!!

## 2019-07-17 DIAGNOSIS — M25551 Pain in right hip: Secondary | ICD-10-CM | POA: Diagnosis not present

## 2019-07-18 DIAGNOSIS — M24851 Other specific joint derangements of right hip, not elsewhere classified: Secondary | ICD-10-CM | POA: Diagnosis not present

## 2019-07-18 DIAGNOSIS — M1611 Unilateral primary osteoarthritis, right hip: Secondary | ICD-10-CM | POA: Diagnosis not present

## 2019-07-19 ENCOUNTER — Encounter (INDEPENDENT_AMBULATORY_CARE_PROVIDER_SITE_OTHER): Payer: Self-pay | Admitting: Gastroenterology

## 2019-07-20 ENCOUNTER — Encounter: Payer: Self-pay | Admitting: Orthopaedic Surgery

## 2019-07-20 ENCOUNTER — Ambulatory Visit (INDEPENDENT_AMBULATORY_CARE_PROVIDER_SITE_OTHER): Payer: Medicare Other | Admitting: Orthopaedic Surgery

## 2019-07-20 ENCOUNTER — Other Ambulatory Visit: Payer: Self-pay

## 2019-07-20 ENCOUNTER — Ambulatory Visit: Payer: Self-pay

## 2019-07-20 VITALS — BP 149/91 | HR 61 | Ht 66.0 in | Wt 178.0 lb

## 2019-07-20 DIAGNOSIS — M25511 Pain in right shoulder: Secondary | ICD-10-CM | POA: Diagnosis not present

## 2019-07-20 DIAGNOSIS — M65331 Trigger finger, right middle finger: Secondary | ICD-10-CM | POA: Diagnosis not present

## 2019-07-20 DIAGNOSIS — G8929 Other chronic pain: Secondary | ICD-10-CM | POA: Diagnosis not present

## 2019-07-20 DIAGNOSIS — M542 Cervicalgia: Secondary | ICD-10-CM

## 2019-07-20 NOTE — Progress Notes (Signed)
Office Visit Note   Patient: Kimberly [name]           Date of Birth: August 31, 1965           MRN: [id number] Visit Date: 07/20/2019              Requested by: Neale Burly, MD [place],  [place] [address] PCP: Neale Burly, MD   Assessment & Plan: Visit Diagnoses:  1. Neck pain   2. Chronic right shoulder pain   3. Trigger finger, right middle finger     Plan: History of right full-thickness rotator cuff tear by MRI scan done in [place] 2 years ago.  She is having persistent shoulder problems and will need a new MRI scan right shoulder to evaluate her for progressive rotator cuff tearing evaluation of muscle atrophy.  Office follow-up after MRI right shoulder.  Follow-Up Instructions: Return after right shoulder MRI scan.  Orders:  Orders Placed This Encounter  Procedures  . XR Cervical Spine 2 or 3 views  . XR Shoulder Right  . MR SHOULDER RIGHT WO CONTRAST   No orders of the defined types were placed in this encounter.     Procedures: No procedures performed   Clinical Data: No additional findings.   Subjective: Chief Complaint  Patient presents with  . Right Shoulder - Pain  . Neck - Pain  . Right Hand - Pain    HPI 54 year old female who is here also with her daughter part of her mother daughter visit here with complaints of right shoulder pain which is been present for 2 years.  At one point she was seeing the orthopedist in [place] and was scheduled for rotator cuff repair for right shoulder and had been given Soma which was helping her with her pain and helped her sleep.  She has been treated with injections she does not have a copy of the disc but states she has had persistent pain and problems.  She did not get surgery since her husband was incarcerated and she had to start working.  She has had trigger finger injection by Dr. Durward Fortes with persistent triggering of the right long finger but states it is somewhat better after the  injection.  No problems with the left shoulder which she can move easily.  She does have type 2 diabetes ADHD on Adderall 20mg  once a day to twice a day.  She also takes lorazepam chronically.  Patient states the right shoulder is been persistently painful she can reach overhead has problems washing her hair.  She states she is not ready to proceed with rotator cuff surgery.  Review of Systems previous CABG procedure x1+ for a past history of smoking asthma type 2 diabetes ADHD, right rotator cuff tear, right trigger finger with persistent triggering post injection.   Objective: Vital Signs: BP (!) 149/91   Pulse 61   Ht 5\' 6"  (1.676 m)   Wt 178 lb (80.7 kg)   BMI 28.73 kg/m   Physical Exam Constitutional:      Appearance: She is well-developed.  HENT:     Head: Normocephalic.     Right Ear: External ear normal.     Left Ear: External ear normal.  Eyes:     Pupils: Pupils are equal, round, and reactive to light.  Neck:     Thyroid: No thyromegaly.     Trachea: No tracheal deviation.  Cardiovascular:     Rate and Rhythm: Normal rate.  Pulmonary:     Effort: Pulmonary effort is normal.  Abdominal:     Palpations: Abdomen is soft.  Skin:    General: Skin is warm and dry.  Neurological:     Mental Status: She is alert and oriented to person, place, and time.  Psychiatric:        Behavior: Behavior normal.     Ortho Exam patient has positive drop arm test right shoulder negative left.  Abduction only to 90 degrees.  Supraspinatus atrophy.  Subscap external rotation is strong.  Long of the biceps is palpable and normal.  Elbows reach full extension of extremity reflexes are 2+ no brachial plexus tenderness negative Spur[place].  Normal heel toe gait.  Tenderness over the A1 pulley right long finger.  Specialty Comments:  No specialty comments available.  Imaging: No results found.   PMFS History: Patient Active Problem List   Diagnosis Date Noted  . Trigger finger, right  middle finger 12/28/2018  . Hyperlipidemia associated with type 2 diabetes mellitus ([facility]) 05/06/2015  . Adjustment reaction with mixed emotional features 04/22/2015  . S/P CABG x 1 04/11/2015  . Unstable angina ([facility])   . Hypertensive heart disease without heart failure   . Diabetes mellitus, type 2 ([facility]) 04/06/2015  . Hyperlipidemia 04/06/2015  . ADHD (attention deficit hyperactivity disorder) 04/06/2015  . Tobacco abuse 04/06/2015  . Asthma 04/06/2015   Past Medical History:  Diagnosis Date  . Asthma   . CAD (coronary artery disease) 04/06/2015   CABG with LIMA-LAD  . Diabetes mellitus without complication ([facility])   . Myocardial infarction ([facility])    Approx 2014 per pt report, no further details available    Family History  Problem Relation Age of Onset  . Heart disease Other        grandfather  . Diabetes Other        mother    Past Surgical History:  Procedure Laterality Date  . ABDOMINAL HYSTERECTOMY    . APPENDECTOMY    . CARDIAC CATHETERIZATION N/A 04/08/2015   Procedure: Left Heart Cath and Coronary Angiography;  Surgeon: Jettie Booze, MD; LAD 95%, single vessel disease   . CORONARY ARTERY BYPASS GRAFT N/A 04/11/2015   Procedure: CORONARY ARTERY BYPASS GRAFTING (CABG) x one using left internal mamary artery. ;  Surgeon: Ivin Poot, MD;  LIMA-LAD  . TEE WITHOUT CARDIOVERSION N/A 04/11/2015   Procedure: TRANSESOPHAGEAL ECHOCARDIOGRAM (TEE);  Surgeon: Ivin Poot, MD;  Location: [facility];  Service: Open Heart Surgery;  Laterality: N/A;   Social History   Occupational History  . Not on file  Tobacco Use  . Smoking status: Current Every Day Smoker    Types: Cigarettes  . Smokeless tobacco: Never Used  Substance and Sexual Activity  . Alcohol use: Yes    Alcohol/week: 3.0 standard drinks    Types: 3 Glasses of wine per week    Comment: occasional  . Drug use: No  . Sexual activity: Not on file

## 2019-07-25 ENCOUNTER — Other Ambulatory Visit: Payer: Self-pay | Admitting: Family Medicine

## 2019-07-25 DIAGNOSIS — I739 Peripheral vascular disease, unspecified: Secondary | ICD-10-CM

## 2019-08-03 DIAGNOSIS — E083293 Diabetes mellitus due to underlying condition with mild nonproliferative diabetic retinopathy without macular edema, bilateral: Secondary | ICD-10-CM | POA: Diagnosis not present

## 2019-08-07 DIAGNOSIS — M75121 Complete rotator cuff tear or rupture of right shoulder, not specified as traumatic: Secondary | ICD-10-CM | POA: Diagnosis not present

## 2019-08-07 DIAGNOSIS — M25111 Fistula, right shoulder: Secondary | ICD-10-CM | POA: Diagnosis not present

## 2019-08-07 DIAGNOSIS — M25511 Pain in right shoulder: Secondary | ICD-10-CM | POA: Diagnosis not present

## 2019-08-08 ENCOUNTER — Ambulatory Visit (INDEPENDENT_AMBULATORY_CARE_PROVIDER_SITE_OTHER): Payer: Medicare Other

## 2019-08-08 DIAGNOSIS — I739 Peripheral vascular disease, unspecified: Secondary | ICD-10-CM

## 2019-08-08 DIAGNOSIS — M79606 Pain in leg, unspecified: Secondary | ICD-10-CM

## 2019-08-10 ENCOUNTER — Ambulatory Visit (INDEPENDENT_AMBULATORY_CARE_PROVIDER_SITE_OTHER): Payer: Medicare Other | Admitting: Orthopaedic Surgery

## 2019-08-10 ENCOUNTER — Encounter: Payer: Self-pay | Admitting: Orthopaedic Surgery

## 2019-08-10 ENCOUNTER — Other Ambulatory Visit: Payer: Self-pay

## 2019-08-10 ENCOUNTER — Ambulatory Visit (INDEPENDENT_AMBULATORY_CARE_PROVIDER_SITE_OTHER): Payer: Medicare Other | Admitting: Gastroenterology

## 2019-08-10 DIAGNOSIS — M7541 Impingement syndrome of right shoulder: Secondary | ICD-10-CM | POA: Diagnosis not present

## 2019-08-10 NOTE — Progress Notes (Signed)
Office Visit Note   Patient: Kimberly [name]           Date of Birth: [birth year]           MRN: [id number] Visit Date: 08/10/2019              Requested by: Neale Burly, MD [place],  [place] [address] PCP: Neale Burly, MD   Assessment & Plan: Visit Diagnoses:  1. Impingement syndrome of right shoulder     Plan: Patient had symptoms since he walks her dog which is a pit bull and is growing.  We discussed using her opposite left hand or walking the dog to decrease symptoms in the right shoulder.  She does not have a full-thickness tear and we will set her up for some physical therapy.  I will recheck her in 6 weeks.  She states when she had previous injection in [place] she did not get sustained relief.  We discussed possibly trying 1 repeat injection if therapy does not give her relief.  Follow-Up Instructions: Return in about 6 weeks (around 09/21/2019).   Orders:  No orders of the defined types were placed in this encounter.  No orders of the defined types were placed in this encounter.     Procedures: No procedures performed   Clinical Data: No additional findings.   Subjective: Chief Complaint  Patient presents with   Right Shoulder - Pain, Follow-up    MRI review    HPI 54 year old female returns with ongoing problems with her shoulder.  She states the muscle relaxant has helped her sleep and she states that she like refill.  She is already on lorazepam for anxiety as cardiac history also takes ADHD medication I discussed with her she will have to talk with her PCP about ongoing medication for sleep.  MRI scans been obtained of her shoulder which show some moderate tendinopathy of supraspinatus and severe tendinosis of the infraspinatus with fraying.  Prominence of the acromioclavicular joint noted similar to her plain radiograph with some impingement of the supraspinatus musculotendinous junction.  She did not have a full-thickness tear.  Review  of Systems updated unchanged from last office note 07/20/2019 of note is diabetes previous heart attack and bypass surgery.   Objective: Vital Signs: BP 126/81    Pulse 56    Ht 5\' 6"  (1.676 m)    Wt 178 lb (80.7 kg)    BMI 28.73 kg/m   Physical Exam Constitutional:      Appearance: She is well-developed.  HENT:     Head: Normocephalic.     Right Ear: External ear normal.     Left Ear: External ear normal.  Eyes:     Pupils: Pupils are equal, round, and reactive to light.  Neck:     Thyroid: No thyromegaly.     Trachea: No tracheal deviation.  Cardiovascular:     Rate and Rhythm: Normal rate.  Pulmonary:     Effort: Pulmonary effort is normal.  Abdominal:     Palpations: Abdomen is soft.  Skin:    General: Skin is warm and dry.  Neurological:     Mental Status: She is alert and oriented to person, place, and time.  Psychiatric:        Behavior: Behavior normal.     Ortho Exam patient has some discomfort with cervical range of motion right trapezial region.  Positive impingement right shoulder.  Sensation hand is intact no upper extremity atrophy.  Specialty Comments:  No specialty comments available.  Imaging: CLINICAL DATA: Right shoulder pain for 1 year. Radiates into the right side of the neck at times.  EXAM: MRI OF THE UPPER RIGHT JOINT WITHOUT CONTRAST  TECHNIQUE: Multiplanar, multisequence MR imaging of the shoulder was performed. No intravenous contrast was administered.  COMPARISON: None.  FINDINGS: Rotator cuff: Moderate tendinosis of the supraspinatus tendon. Severe tendinosis of the infraspinatus tendon with fraying along the articular surface. Teres minor tendon is intact. Subscapularis tendon is intact.  Muscles: No muscle atrophy or edema. No intramuscular fluid collection or hematoma.  Biceps Long Head: Intraarticular and extraarticular portions of the biceps tendon are intact.  Acromioclavicular Joint: Severe arthropathy of the  acromioclavicular joint with marrow edema on either side of the joint. Hypertrophic changes along the inferior margin of the [facility] joint deforming the musculotendinous junction of the supraspinatus tendon. Type I acromion. Trace subacromial/subdeltoid bursal fluid.  Glenohumeral Joint: No joint effusion. No chondral defect.  Labrum: Grossly intact, but evaluation is limited by lack of intraarticular fluid/contrast.  Bones: No fracture or dislocation. No aggressive osseous lesion.  Other: No fluid collection or hematoma.  IMPRESSION: 1. Moderate tendinosis of the supraspinatus tendon. 2. Severe tendinosis of the infraspinatus tendon with fraying along the articular surface. 3. Severe arthropathy of the acromioclavicular joint with marrow edema on either side of the joint. Hypertrophic changes along the inferior margin of the [facility] joint deforming the musculotendinous junction of the supraspinatus tendon.   Electronically Signed By: Kathreen Devoid On: 08/08/2019 08:22    PMFS History: Patient Active Problem List   Diagnosis Date Noted   Impingement syndrome of right shoulder 08/10/2019   Trigger finger, right middle finger 12/28/2018   Hyperlipidemia associated with type 2 diabetes mellitus ([facility]) 05/06/2015   Adjustment reaction with mixed emotional features 04/22/2015   S/P CABG x 1 04/11/2015   Unstable angina ([facility])    Hypertensive heart disease without heart failure    Diabetes mellitus, type 2 ([facility]) 04/06/2015   Hyperlipidemia 04/06/2015   ADHD (attention deficit hyperactivity disorder) 04/06/2015   Tobacco abuse 04/06/2015   Asthma 04/06/2015   Past Medical History:  Diagnosis Date   Asthma    CAD (coronary artery disease) 04/06/2015   CABG with LIMA-LAD   Diabetes mellitus without complication ([place])    Myocardial infarction ([facility])    Approx 2014 per pt report, no further details available    Family History  Problem Relation Age of Onset   Heart  disease Other        grandfather   Diabetes Other        mother    Past Surgical History:  Procedure Laterality Date   ABDOMINAL HYSTERECTOMY     APPENDECTOMY     CARDIAC CATHETERIZATION N/A 04/08/2015   Procedure: Left Heart Cath and Coronary Angiography;  Surgeon: Jettie Booze, MD; LAD 95%, single vessel disease    CORONARY ARTERY BYPASS GRAFT N/A 04/11/2015   Procedure: CORONARY ARTERY BYPASS GRAFTING (CABG) x one using left internal mamary artery. ;  Surgeon: Ivin Poot, MD;  LIMA-LAD   TEE WITHOUT CARDIOVERSION N/A 04/11/2015   Procedure: TRANSESOPHAGEAL ECHOCARDIOGRAM (TEE);  Surgeon: Ivin Poot, MD;  Location: [facility];  Service: Open Heart Surgery;  Laterality: N/A;   Social History   Occupational History   Not on file  Tobacco Use   Smoking status: Current Every Day Smoker    Types: Cigarettes   Smokeless tobacco: Never Used  Substance and Sexual  Activity   Alcohol use: Yes    Alcohol/week: 3.0 standard drinks    Types: 3 Glasses of wine per week    Comment: occasional   Drug use: No   Sexual activity: Not on file

## 2019-08-11 ENCOUNTER — Telehealth: Payer: Self-pay | Admitting: *Deleted

## 2019-08-11 NOTE — Telephone Encounter (Signed)
-----   Message from Verta Ellen., NP sent at 08/09/2019  6:27 PM EDT ----- Lower extremity arterial doppler showed no evidence of peripheral arterial disease. If leg pain continues we may need to adjust statin medication to see if the pain is due to myalgias from statin medication. Please let her know the study did not show any evidence of arterial disease in her legs. Thank You

## 2019-08-11 NOTE — Telephone Encounter (Signed)
Patient informed and verbalized understanding of plan. Copy sent to PCP 

## 2019-08-15 DIAGNOSIS — G8929 Other chronic pain: Secondary | ICD-10-CM | POA: Diagnosis not present

## 2019-08-15 DIAGNOSIS — M67813 Other specified disorders of tendon, right shoulder: Secondary | ICD-10-CM | POA: Diagnosis not present

## 2019-08-15 DIAGNOSIS — M542 Cervicalgia: Secondary | ICD-10-CM | POA: Diagnosis not present

## 2019-08-17 DIAGNOSIS — M25561 Pain in right knee: Secondary | ICD-10-CM | POA: Diagnosis not present

## 2019-08-17 DIAGNOSIS — R519 Headache, unspecified: Secondary | ICD-10-CM | POA: Diagnosis not present

## 2019-08-17 DIAGNOSIS — M79621 Pain in right upper arm: Secondary | ICD-10-CM | POA: Diagnosis not present

## 2019-08-17 DIAGNOSIS — M25511 Pain in right shoulder: Secondary | ICD-10-CM | POA: Diagnosis not present

## 2019-08-17 DIAGNOSIS — M542 Cervicalgia: Secondary | ICD-10-CM | POA: Diagnosis not present

## 2019-08-17 DIAGNOSIS — G894 Chronic pain syndrome: Secondary | ICD-10-CM | POA: Diagnosis not present

## 2019-08-22 DIAGNOSIS — M67813 Other specified disorders of tendon, right shoulder: Secondary | ICD-10-CM | POA: Diagnosis not present

## 2019-08-22 DIAGNOSIS — M542 Cervicalgia: Secondary | ICD-10-CM | POA: Diagnosis not present

## 2019-08-22 DIAGNOSIS — G8929 Other chronic pain: Secondary | ICD-10-CM | POA: Diagnosis not present

## 2019-08-24 DIAGNOSIS — M67813 Other specified disorders of tendon, right shoulder: Secondary | ICD-10-CM | POA: Diagnosis not present

## 2019-08-24 DIAGNOSIS — G8929 Other chronic pain: Secondary | ICD-10-CM | POA: Diagnosis not present

## 2019-08-24 DIAGNOSIS — M542 Cervicalgia: Secondary | ICD-10-CM | POA: Diagnosis not present

## 2019-08-29 DIAGNOSIS — M542 Cervicalgia: Secondary | ICD-10-CM | POA: Diagnosis not present

## 2019-08-29 DIAGNOSIS — M67813 Other specified disorders of tendon, right shoulder: Secondary | ICD-10-CM | POA: Diagnosis not present

## 2019-08-29 DIAGNOSIS — G8929 Other chronic pain: Secondary | ICD-10-CM | POA: Diagnosis not present

## 2019-09-01 DIAGNOSIS — M67813 Other specified disorders of tendon, right shoulder: Secondary | ICD-10-CM | POA: Diagnosis not present

## 2019-09-01 DIAGNOSIS — G8929 Other chronic pain: Secondary | ICD-10-CM | POA: Diagnosis not present

## 2019-09-01 DIAGNOSIS — M25511 Pain in right shoulder: Secondary | ICD-10-CM | POA: Diagnosis not present

## 2019-09-01 DIAGNOSIS — M542 Cervicalgia: Secondary | ICD-10-CM | POA: Diagnosis not present

## 2019-09-05 DIAGNOSIS — G8929 Other chronic pain: Secondary | ICD-10-CM | POA: Diagnosis not present

## 2019-09-05 DIAGNOSIS — M542 Cervicalgia: Secondary | ICD-10-CM | POA: Diagnosis not present

## 2019-09-05 DIAGNOSIS — M67813 Other specified disorders of tendon, right shoulder: Secondary | ICD-10-CM | POA: Diagnosis not present

## 2019-09-07 DIAGNOSIS — Z0000 Encounter for general adult medical examination without abnormal findings: Secondary | ICD-10-CM | POA: Diagnosis not present

## 2019-09-08 DIAGNOSIS — Z9889 Other specified postprocedural states: Secondary | ICD-10-CM | POA: Diagnosis not present

## 2019-09-08 DIAGNOSIS — R0602 Shortness of breath: Secondary | ICD-10-CM | POA: Diagnosis not present

## 2019-09-13 DIAGNOSIS — M67813 Other specified disorders of tendon, right shoulder: Secondary | ICD-10-CM | POA: Diagnosis not present

## 2019-09-13 DIAGNOSIS — G8929 Other chronic pain: Secondary | ICD-10-CM | POA: Diagnosis not present

## 2019-09-13 DIAGNOSIS — M542 Cervicalgia: Secondary | ICD-10-CM | POA: Diagnosis not present

## 2019-09-14 NOTE — Progress Notes (Signed)
Cardiology Office Note  Date: 09/15/2019   ID: Kimberly [name], DOB 13-Oct-1965, MRN [id number]  PCP:  Neale Burly, MD  Cardiologist:  No primary care provider on file. Electrophysiologist:  None   Chief Complaint:  follow-up CAD, HTN, HLD, tobacco abuse, palpitations  History of Present Illness: Kimberly [name] is a 54 y.o. female with a history of CAD (one-vessel CABG with LIMA to LAD 04/11/2015, HTN, HLD, tobacco abuse, COPD, IDDM, palpitations.  Last visit with Dr. Bronson Ing 01/02/2019.  She denied any exertional chest pain or dyspnea.  She did complain of palpitations.  Stated she had a stress test and cardiac catheterization   She required emergency vascular surgery of right femoral artery.  She smokes 6 to 7 cigarettes/day.  Crestor was increased to 20 mg.  She was on both lisinopril and losartan at last visit.  Lisinopril was discontinued and losartan was increased to 50 mg daily.  Plans were to attempt to obtain a copy of stress test, event monitor and cardiac catheterization report.  Her blood pressure was elevated at last visit.  As mentioned above her losartan was increased to 50 mg Lopressor was increased to 50 mg p.o. twice daily.  LDL was not at goal.  Crestor was increased to 20 mg daily  Presented last visit with some complaints of epigastric pain which radiates around under her left rib cage.  Occurs without exertion and with exertion.  States it has no particular pattern.  Denied any radiation to neck, arms, back, jaw.  Denied any associated nausea, vomiting, or diaphoresis.  She stated she had been told she needed to see a gastroenterologist in the past.  She had some reflux symptoms.  She denied the pain occurred after eating.  Also complained of some lower leg pain when walking specifically in the calves and thighs which was relieved with rest.  Otherwise she denied any progressive anginal or exertional symptoms, palpitations or arrhythmias, orthostatic symptoms, stroke or  TIA-like symptoms, lower extremity edema.  Today she presents with more complaints of leg pain/leg numbness/leg tingling.  Also complaining of pain under her left breast aggravated by taking deep breaths.  She states having pain right leg more so than left.  She states she had a cardiac catheterization in 2019 which apparently resulted in the Angio-Seal closure device migrating and causing limb ischemia.  She had surgery to remove the Angio-Seal.  She does have diabetes and may have neuropathy.  She has tried gabapentin in the past which made her drowsy and did not tolerate.  She has spinal degenerative disease in her cervical area.  Recently saw orthopedics who recommended she see [place] for her leg pain.  At last visit her Crestor was increased due to LDL not at goal.  She had an ABI study after last visit which was normal demonstrating no arterial disease.  She continues to smoke but states she is trying to cut down.   Past Medical History:  Diagnosis Date  . Asthma   . CAD (coronary artery disease) 04/06/2015   CABG with LIMA-LAD  . Diabetes mellitus without complication ([facility])   . Myocardial infarction ([facility])    Approx 2014 per pt report, no further details available    Past Surgical History:  Procedure Laterality Date  . ABDOMINAL HYSTERECTOMY    . APPENDECTOMY    . CARDIAC CATHETERIZATION N/A 04/08/2015   Procedure: Left Heart Cath and Coronary Angiography;  Surgeon: Jettie Booze, MD; LAD 95%, single vessel disease   .  CORONARY ARTERY BYPASS GRAFT N/A 04/11/2015   Procedure: CORONARY ARTERY BYPASS GRAFTING (CABG) x one using left internal mamary artery. ;  Surgeon: Ivin Poot, MD;  LIMA-LAD  . TEE WITHOUT CARDIOVERSION N/A 04/11/2015   Procedure: TRANSESOPHAGEAL ECHOCARDIOGRAM (TEE);  Surgeon: Ivin Poot, MD;  Location: [facility];  Service: Open Heart Surgery;  Laterality: N/A;    Current Outpatient Medications  Medication Sig Dispense Refill  . albuterol (PROVENTIL  HFA;VENTOLIN HFA) 108 (90 Base) MCG/ACT inhaler Inhale 1 puff into the lungs every 6 (six) hours as needed for wheezing or shortness of breath. 54 g 3  . albuterol (PROVENTIL) (2.5 MG/3ML) 0.083% nebulizer solution Take 3 mLs (2.5 mg total) by nebulization every 6 (six) hours as needed for wheezing or shortness of breath. 75 mL 12  . amphetamine-dextroamphetamine (ADDERALL) 20 MG tablet Take 1 tablet (20 mg total) by mouth daily. 30 tablet 0  . aspirin 81 MG tablet Take 4 tablets (325 mg total) by mouth daily. (Patient taking differently: Take 162 mg by mouth daily. ) 90 tablet 3  . Blood Glucose Monitoring Suppl (TRUE METRIX METER) w/Device KIT Use as directed 1 kit 0  . Cholecalciferol (VITAMIN D3) 50 MCG (2000 UT) TABS Take by mouth.    Marland Kitchen glucose blood (TRUE METRIX BLOOD GLUCOSE TEST) test strip Use as instructed 100 each 12  . hydrOXYzine (ATARAX/VISTARIL) 25 MG tablet Take 1 tablet (25 mg total) by mouth 3 (three) times daily as needed for anxiety. 30 tablet 0  . insulin aspart (NOVOLOG FLEXPEN) 100 UNIT/ML FlexPen As per sliding scale. 15 mL 11  . Insulin Glargine (LANTUS) 100 UNIT/ML Solostar Pen Inject 60 Units into the skin at bedtime. 15 pen 6  . LORazepam (ATIVAN) 0.5 MG tablet Take 0.5 mg by mouth 2 (two) times daily.    Marland Kitchen losartan (COZAAR) 50 MG tablet Take 1 tablet (50 mg total) by mouth daily. 90 tablet 3  . Melatonin 3 MG TABS Take 1 tablet (3 mg total) by mouth at bedtime as needed. 30 tablet 0  . metFORMIN (GLUCOPHAGE) 500 MG tablet Take by mouth 2 (two) times daily with a meal.    . metoprolol (LOPRESSOR) 50 MG tablet Take 1 tablet (50 mg total) by mouth 2 (two) times daily. 60 tablet 3  . omeprazole (PRILOSEC) 40 MG capsule Take 40 mg by mouth daily.    . traZODone (DESYREL) 100 MG tablet Take by mouth.    . TRUEPLUS LANCETS 28G MISC Use as directed 100 each 12  . rosuvastatin (CRESTOR) 20 MG tablet Take 1 tablet (20 mg total) by mouth daily. 90 tablet 1   No current  facility-administered medications for this visit.   Allergies:  Oxycodone, Strattera [atomoxetine hcl], and Pyridium [phenazopyridine hcl]   Social History: The patient  reports that she has been smoking cigarettes. She has never used smokeless tobacco. She reports current alcohol use of about 3.0 standard drinks of alcohol per week. She reports that she does not use drugs.   Family History: The patient's family history includes Diabetes in an other family member; Heart disease in an other family member.   ROS:  Please see the history of present illness. Otherwise, complete review of systems is positive for none.  All other systems are reviewed and negative.   Physical Exam: VS:  BP 138/90   Pulse 86   Ht $R'5\' 4"'QC$  (1.626 m)   Wt 176 lb (79.8 kg)   SpO2 98%   BMI 30.21  kg/m , BMI Body mass index is 30.21 kg/m.   Wt Readings from Last 3 Encounters:  09/15/19 176 lb (79.8 kg)  08/10/19 178 lb (80.7 kg)  07/20/19 178 lb (80.7 kg)    General: Patient appears comfortable at rest. Neck: Supple, no elevated JVP or carotid bruits, no thyromegaly. Lungs: Clear to auscultation, nonlabored breathing at rest. Cardiac: Regular rate and rhythm, no S3 or significant systolic murmur, no pericardial rub. Extremities: No pitting edema, distal pulses 2+. Skin: Warm and dry. Musculoskeletal: No kyphosis. Neuropsychiatric: Alert and oriented x3, affect grossly appropriate.  ECG: September 15, 2019 EKG today shows normal sinus rhythm rate of 72.  Recent Labwork: 12/14/2018: ALT 14; AST 14; BUN 14; Creatinine, Ser 0.75; Hemoglobin 14.5; Platelets 293; Potassium 4.8; Sodium 139     Component Value Date/Time   CHOL 183 12/14/2018 1146   TRIG 171 (H) 12/14/2018 1146   HDL 49 12/14/2018 1146   CHOLHDL 3.7 12/14/2018 1146   CHOLHDL 4.3 04/06/2015 1816   VLDL 53 (H) 04/06/2015 1816   LDLCALC 104 (H) 12/14/2018 1146    Other Studies Reviewed Today:  Lower arterial duplex 08/10/2019 Summary:  Right:  Resting right ankle-brachial index is within normal range. No  evidence of significant right lower extremity arterial disease. The right  toe-brachial index is normal.   Left: Resting left ankle-brachial index is within normal range. No  evidence of significant left lower extremity arterial disease. The left  toe-brachial index is normal.   Cardiac catheterization 11/18/2017 conclusions: Obstructive native one-vessel coronary artery disease. Status post CABG with one-to-one grafts patent. Preserved left ventricular systolic function with regional wall motion as above.  Angio-Seal closure of right common femoral artery.  Echo: 04/08/2015 - Left ventricle: Distal septal hypokinesis The cavity size was  mildly dilated. Wall thickness was normal. Systolic function was  normal. The estimated ejection fraction was in the range of 50%  to 55%. Doppler parameters are consistent with elevated  ventricular end-diastolic filling pressure. - Aortic valve: There was mild regurgitation. - Atrial septum: No defect or patent foramen ovale was identified.  CATH: 04/08/2015  Ost LAD to Prox LAD lesion, 95% stenosed.  Severe tortuosity in the right subclavian made torquing catheters quite difficult. Would not use the right radial in the future for access site for cardiac cath. Severe ostial LAD stenosis. There is no landing zone for stent. Fixing this percutaneously would require leaving stents in the distal left main and putting a large ramus and circumflex at risk to be jailed. She would also likely have to be on lifelong clopidogrel. I think a better option would be to have a surgical consult for possible LIMA to LAD. She has a good target vessel in the mid to distal LAD.  Assessment and Plan:   1. CAD in native artery Complaining of intermittent pain under her left breast aggravated by deep breaths.  She states 2 days ago she had consistent pain in that area which eventually resolved.   Continue aspirin 81 mg.  Metoprolol 50 mg p.o. twice daily.  2. Essential hypertension She is normotensive today with a blood pressure of 122/68.  Continue losartan 50 mg daily.  3. Hyperlipidemia LDL goal <70 Continue Crestor 20 mg daily.  Last LDL 7 months ago 104.  Goal is less than 70.  Given concurrent history of diabetes we need to encourage dose of Crestor.  Decrease Crestor to 20 mg daily due to possible statin associated muscle symptoms.  4. Tobacco use Patient  states she has cut way back on cigarettes.  She is occasionally vaping.  Advised cessation if possible  5. Palpitations States she has occasional palpitations but not bothersome and very sporadic  6. Epigastric pain Patient describes epigastric pain/left below the rib cage and axillary region.  Not associated with activity.  No aggravating or alleviating factors.  No associated nausea, vomiting, or diaphoresis.  No radiation.  States she was told before she has gastric reflux symptoms and needed to see a GI specialist.  She was referred to [facility] at last visit  7.  Leg pain Complaining of increased leg pain and thighs and calves when walking.  Recent lower arterial study with ABIs showed normal ABIs on left and right with normal TBI's on the left and right.  Patient states she had a previous cardiac catheterization in 2019 with access through her right groin.  She states that he had to perform emergency surgery on the right leg after the cardiac catheterization secondary to reduced blood flow.  Apparently she had an angio seal placed which may have migrated ultimately requiring removal.  She states that leg has not been right since then.  She has 2+ DP and PT pulses, positive popliteal pulses, and reduced pulse 1-1+ in the right femoral upon palpation.  Complaining of numbness and tingling in her right and left leg but more so in her right leg.  She does have diabetes and may have some neuropathic pain.  She has  tried gabapentin in the past which made her sleepy.  She also has cervical degenerative disease and may likely have LS spine disease.  We will get an LS spine x-ray.  We'll get a D-dimer to check for DVT.  We will reduce Crestor to 20 mg to see if this will relieve some of the leg pain due to possible statin associated muscle symptoms.   Medication Adjustments/Labs and Tests Ordered: Current medicines are reviewed at length with the patient today.  Concerns regarding medicines are outlined above.   Disposition: Follow-up with cardiology or APP 1 month Signed, Levell July, NP 09/15/2019 8:51 AM    [facility] at [address], [place], [place] [address] Phone: [phone]; Fax: [phone]

## 2019-09-15 ENCOUNTER — Encounter: Payer: Self-pay | Admitting: Family Medicine

## 2019-09-15 ENCOUNTER — Encounter: Payer: Self-pay | Admitting: *Deleted

## 2019-09-15 ENCOUNTER — Other Ambulatory Visit: Payer: Self-pay

## 2019-09-15 ENCOUNTER — Ambulatory Visit (INDEPENDENT_AMBULATORY_CARE_PROVIDER_SITE_OTHER): Payer: Medicare Other | Admitting: Family Medicine

## 2019-09-15 VITALS — BP 138/90 | HR 86 | Ht 64.0 in | Wt 176.0 lb

## 2019-09-15 DIAGNOSIS — R0602 Shortness of breath: Secondary | ICD-10-CM | POA: Diagnosis not present

## 2019-09-15 DIAGNOSIS — M545 Low back pain, unspecified: Secondary | ICD-10-CM

## 2019-09-15 DIAGNOSIS — I10 Essential (primary) hypertension: Secondary | ICD-10-CM

## 2019-09-15 MED ORDER — ROSUVASTATIN CALCIUM 20 MG PO TABS
20.0000 mg | ORAL_TABLET | Freq: Every day | ORAL | 1 refills | Status: DC
Start: 2019-09-15 — End: 2019-11-30

## 2019-09-15 NOTE — Patient Instructions (Signed)
Your physician recommends that you schedule a follow-up appointment in: [place], NP  Your physician has recommended you make the following change in your medication:   DECREASE CRESTOR 20 [place] physician recommends that you return for lab work in: [place]   Thank you for choosing [facility]!!

## 2019-09-19 ENCOUNTER — Telehealth: Payer: Self-pay | Admitting: *Deleted

## 2019-09-19 DIAGNOSIS — R0602 Shortness of breath: Secondary | ICD-10-CM | POA: Diagnosis not present

## 2019-09-19 DIAGNOSIS — R079 Chest pain, unspecified: Secondary | ICD-10-CM

## 2019-09-19 NOTE — Telephone Encounter (Signed)
[facility] lab called with d-dimer 0.70 - results being uploaded to care everywhere

## 2019-09-19 NOTE — Telephone Encounter (Signed)
D dimer is elevated. We need to get a CT per PE protocol  to rule out PE. Ask her if she is still having leg pain in the right leg and any noticeable swelling or associated short ness of breath, or rapid heart rates or increased difficulty breathing. Just so we can document. Ask her if she is still pain below her left breast.

## 2019-09-19 NOTE — Telephone Encounter (Signed)
Pt aware and says she still having leg pain not a lot of swelling and some SOB when she walks - feels like her HR goes up at times but not bothersome - will forward to scheduling and orders placed for CT

## 2019-09-19 NOTE — Addendum Note (Signed)
Addended by: Julian Hy T on: 09/19/2019 03:58 PM   Modules accepted: Orders

## 2019-09-20 ENCOUNTER — Other Ambulatory Visit: Payer: Self-pay | Admitting: *Deleted

## 2019-09-20 ENCOUNTER — Ambulatory Visit (HOSPITAL_COMMUNITY): Admission: RE | Admit: 2019-09-20 | Payer: Medicare Other | Source: Ambulatory Visit

## 2019-09-20 DIAGNOSIS — I10 Essential (primary) hypertension: Secondary | ICD-10-CM

## 2019-09-21 ENCOUNTER — Ambulatory Visit (HOSPITAL_COMMUNITY)
Admission: RE | Admit: 2019-09-21 | Discharge: 2019-09-21 | Disposition: A | Discharge status: Home / Self Care | Payer: Medicare Other | Source: Ambulatory Visit | Attending: Family Medicine | Admitting: Family Medicine

## 2019-09-21 ENCOUNTER — Other Ambulatory Visit (HOSPITAL_COMMUNITY)
Admission: RE | Admit: 2019-09-21 | Discharge: 2019-09-21 | Disposition: A | Discharge status: Home / Self Care | Payer: Medicare Other | Source: Ambulatory Visit | Attending: Family Medicine | Admitting: Family Medicine

## 2019-09-21 ENCOUNTER — Other Ambulatory Visit: Payer: Self-pay

## 2019-09-21 DIAGNOSIS — R0602 Shortness of breath: Secondary | ICD-10-CM | POA: Insufficient documentation

## 2019-09-21 DIAGNOSIS — I10 Essential (primary) hypertension: Secondary | ICD-10-CM | POA: Insufficient documentation

## 2019-09-21 DIAGNOSIS — M545 Low back pain, unspecified: Secondary | ICD-10-CM

## 2019-09-21 DIAGNOSIS — M4727 Other spondylosis with radiculopathy, lumbosacral region: Secondary | ICD-10-CM | POA: Diagnosis not present

## 2019-09-21 DIAGNOSIS — R079 Chest pain, unspecified: Secondary | ICD-10-CM | POA: Insufficient documentation

## 2019-09-21 LAB — BASIC METABOLIC PANEL
Anion gap: 11 (ref 5–15)
BUN: 16 mg/dL (ref 6–20)
CO2: 24 mmol/L (ref 22–32)
Calcium: 9.3 mg/dL (ref 8.9–10.3)
Chloride: 102 mmol/L (ref 98–111)
Creatinine, Ser: 0.69 mg/dL (ref 0.44–1.00)
GFR calc Af Amer: >60 mL/min (ref >60)
GFR calc non Af Amer: >60 mL/min (ref >60)
Glucose, Bld: 145 mg/dL — ABNORMAL HIGH (ref 70–99)
Potassium: 4 mmol/L (ref 3.5–5.1)
Sodium: 137 mmol/L (ref 135–145)

## 2019-09-21 MED ORDER — IOHEXOL 350 MG/ML SOLN
100.0000 mL | Freq: Once | INTRAVENOUS | Status: AC | PRN
  Administered 2019-09-21: 100 mL via INTRAVENOUS

## 2019-09-28 ENCOUNTER — Encounter: Payer: Self-pay | Admitting: Orthopaedic Surgery

## 2019-09-28 ENCOUNTER — Ambulatory Visit (INDEPENDENT_AMBULATORY_CARE_PROVIDER_SITE_OTHER): Payer: Medicare Other | Admitting: Orthopaedic Surgery

## 2019-09-28 ENCOUNTER — Other Ambulatory Visit: Payer: Self-pay

## 2019-09-28 VITALS — BP 143/93 | HR 94 | Ht 64.0 in | Wt 176.0 lb

## 2019-09-28 DIAGNOSIS — M65331 Trigger finger, right middle finger: Secondary | ICD-10-CM

## 2019-09-28 NOTE — Progress Notes (Signed)
Office Visit Note   Patient: Kimberly [name]           Date of Birth: 1965-07-16           MRN: [id number] Visit Date: 09/28/2019              Requested by: Neale Burly, MD [place],  [place] [id number] PCP: Neale Burly, MD   Assessment & Plan: Visit Diagnoses:  1. Trigger finger, right middle finger     Plan: Patient like to proceed with trigger finger release we discussed sedation local anesthesia.  With a prior past cardiac history will obtain cardiac clearance. Reviewed her previous lumbar x-rays which showed some spurring at L5-S1 with the space narrowing.  We discussed the acromioclavicular spurring present in the right shoulder.  She does not have a full-thickness tear of the rotator cuff would recommend continued conservative treatment for her shoulder.  We discussed trigger finger release potential for recurrent triggering or development and triggering of other digits.  All questions answered. Follow-Up Instructions: No follow-ups on file.   Orders:  No orders of the defined types were placed in this encounter.  No orders of the defined types were placed in this encounter.     Procedures: No procedures performed   Clinical Data: No additional findings.   Subjective: Chief Complaint  Patient presents with  . Neck - Follow-up, Pain  . 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  Previous MRI scan showed some tendinopathy and partial tearing but no full-thickness tear of the supraspinatus/infraspinatus.  She continues to have problems with right long trigger finger and states she cannot flex to make a fist.  She has had it injected before also had it splinted and is having persistent triggering and  states she like to proceed with surgery.  Patient has some L5-S1 lumbar spondylosis stable noted on recent x-rays ordered by her PCP.  Review of Systems positive for ADHD asthma, type 2 diabetes hyperlipidemia, previous history of CABG.   Objective: Vital Signs: BP (!) 143/93   Pulse 94   Ht 5\' 4"  (1.626 m)   Wt 176 lb (79.8 kg)   BMI 30.21 kg/m   Physical Exam Constitutional:      Appearance: She is well-developed.  HENT:     Head: Normocephalic.     Right Ear: External ear normal.     Left Ear: External ear normal.  Eyes:     Pupils: Pupils are equal, round, and reactive to light.  Neck:     Thyroid: No thyromegaly.     Trachea: No tracheal deviation.  Cardiovascular:     Rate and Rhythm: Normal rate.  Pulmonary:     Effort: Pulmonary effort is normal.  Abdominal:     Palpations: Abdomen is soft.  Skin:    General: Skin is warm and dry.  Neurological:     Mental Status: She is alert and oriented to person, place, and time.  Psychiatric:        Behavior: Behavior normal.     Ortho Exam positive impingement right shoulder.  She lacks 15 degrees full abduction and flexion right shoulder.  No limitation left shoulder.  She is able to heel  and toe walk.  Negative straight leg raising 90 degrees negative logroll of the hips.  Right long finger lacks 3 to 4 cm touching distal palmar crease and passively cannot reach distal palmar crease due to sharp pain at the A1 pulley.  Palpable nodule at the A1 pulley noted with partial flexion of the finger.  All other digits can flex the distal palmar crease. Specialty Comments:  No specialty comments available.  Imaging: No results found.   PMFS History: Patient Active Problem List   Diagnosis Date Noted  . Impingement syndrome of right shoulder 08/10/2019  . Trigger finger, right middle finger 12/28/2018  . Hyperlipidemia associated with type 2 diabetes mellitus ([facility]) 05/06/2015  . Adjustment reaction with mixed emotional  features 04/22/2015  . S/P CABG x 1 04/11/2015  . Unstable angina ([facility])   . Hypertensive heart disease without heart failure   . Diabetes mellitus, type 2 ([facility]) 04/06/2015  . Hyperlipidemia 04/06/2015  . ADHD (attention deficit hyperactivity disorder) 04/06/2015  . Tobacco abuse 04/06/2015  . Asthma 04/06/2015   Past Medical History:  Diagnosis Date  . Asthma   . CAD (coronary artery disease) 04/06/2015   CABG with LIMA-LAD  . Diabetes mellitus without complication ([facility])   . Myocardial infarction ([facility])    Approx 2014 per pt report, no further details available    Family History  Problem Relation Age of Onset  . Heart disease Other        grandfather  . Diabetes Other        mother    Past Surgical History:  Procedure Laterality Date  . ABDOMINAL HYSTERECTOMY    . APPENDECTOMY    . CARDIAC CATHETERIZATION N/A 04/08/2015   Procedure: Left Heart Cath and Coronary Angiography;  Surgeon: Jettie Booze, MD; LAD 95%, single vessel disease   . CORONARY ARTERY BYPASS GRAFT N/A 04/11/2015   Procedure: CORONARY ARTERY BYPASS GRAFTING (CABG) x one using left internal mamary artery. ;  Surgeon: Ivin Poot, MD;  LIMA-LAD  . TEE WITHOUT CARDIOVERSION N/A 04/11/2015   Procedure: TRANSESOPHAGEAL ECHOCARDIOGRAM (TEE);  Surgeon: Ivin Poot, MD;  Location: [facility];  Service: Open Heart Surgery;  Laterality: N/A;   Social History   Occupational History  . Not on file  Tobacco Use  . Smoking status: Current Every Day Smoker    Types: Cigarettes  . Smokeless tobacco: Never Used  Substance and Sexual Activity  . Alcohol use: Yes    Alcohol/week: 3.0 standard drinks    Types: 3 Glasses of wine per week    Comment: occasional  . Drug use: No  . Sexual activity: Not on file

## 2019-10-09 ENCOUNTER — Telehealth: Payer: Self-pay | Admitting: *Deleted

## 2019-10-09 NOTE — Telephone Encounter (Signed)
° °  Kimberly [name] Pre-operative Risk Assessment    [name] STAFF: - Please ensure there is not already an duplicate clearance open for this procedure. - Under Visit Info/Reason for Call, type in Other and utilize the format Clearance MM/DD/YY or Clearance TBD. Do not use dashes or single digits. - If request is for dental extraction, please clarify the # of teeth to be extracted.  Request for surgical clearance:  1. What type of surgery is being performed? Left Long Trigger Finger Release  2. When is this surgery scheduled? TBD  3. What type of clearance is required (medical clearance vs. Pharmacy clearance to hold med vs. Both)? Medical/cardiac clearance  4. Are there any medications that need to be held prior to surgery and how long? No  5. Practice name and name of physician performing surgery? [facility]/Dr. Thana Farr. [place]  6. What is the office phone number? [phone]   7.   What is the office fax number? [phone]  8.   Anesthesia type (None, local, MAC, general) ? Local Standby (MAC)   Marlou Sa 10/09/2019, 8:50 AM  _________________________________________________________________

## 2019-10-09 NOTE — Telephone Encounter (Signed)
   Primary Cardiologist: No primary care provider on file.  Chart reviewed as part of pre-operative protocol coverage. Patient was contacted 10/09/2019 in reference to pre-operative risk assessment for pending surgery as outlined below.  Kimberly [name] was last seen on 09/15/2019 by Levell July NP.  Since that day, Kimberly [name] has done well and denies any exertional chest pain or significant dyspnea on exertion. She was able to walk 2/3 mile at [facility] trail recently without any exertional chest pain. She is clearly able to accomplish more than 4 METS of activity.   Therefore, based on ACC/AHA guidelines, the patient would be at acceptable risk for the planned procedure without further cardiovascular testing.   The patient was advised that if she develops new symptoms prior to surgery to contact our office to arrange for a follow-up visit, and she verbalized understanding.  I will route this recommendation to the requesting party via Epic fax function and remove from pre-op pool. Please call with questions.  [place], [place] 10/09/2019, 11:38 AM

## 2019-10-10 ENCOUNTER — Telehealth: Payer: Self-pay | Admitting: Orthopaedic Surgery

## 2019-10-10 NOTE — Telephone Encounter (Signed)
Please advise 

## 2019-10-10 NOTE — Telephone Encounter (Signed)
I called discussed.  

## 2019-10-10 NOTE — Telephone Encounter (Signed)
Patient is scheduled for left long trigger finger release at [facility] on 10-23-19 @ 10:15am as outpatient. Anesthesia is Local Standby (MAC).  Cardiac clearance obtained from Levell July, NP and has been faxed to pre-op assessment.     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  She would like to let her employer know the amount of time she will be out of work.  Can you please provide her with and estimate of her time out of work and describe her post operative limitations?    cb [phone]  Patient is scheduled for her first post op appointment with Dr Lorin Mercy in the [facility] office on 11-02-19 @ 1:00pm

## 2019-10-12 NOTE — Progress Notes (Signed)
Cardiology Office Note  Date: 10/13/2019   ID: Kimberly [name], DOB [birth year], MRN [id number]  PCP:  Neale Burly, MD  Cardiologist:  No primary care provider on file. Electrophysiologist:  None   Chief Complaint:  follow-up CAD, HTN, HLD, tobacco abuse, palpitations  History of Present Illness: Kimberly [name] is a 54 y.o. female with a history of CAD (one-vessel CABG with LIMA to LAD 04/11/2015, HTN, HLD, tobacco abuse, COPD, IDDM, palpitations.  Last visit with Dr. Bronson Ing 01/02/2019.  She denied any exertional chest pain or dyspnea.  She did complain of palpitations.  Stated she had a stress test and cardiac catheterization   She required emergency vascular surgery of right femoral artery.  She smokes 6 to 7 cigarettes/day.  Crestor was increased to 20 mg.  She was on both lisinopril and losartan at last visit.  Lisinopril was discontinued and losartan was increased to 50 mg daily.  Plans were to attempt to obtain a copy of stress test, event monitor and cardiac catheterization report.  Her blood pressure was elevated at last visit.  As mentioned above her losartan was increased to 50 mg Lopressor was increased to 50 mg p.o. twice daily.  LDL was not at goal.  Crestor was increased to 20 mg daily  Presented last visit with some complaints of epigastric pain which radiated around under her left rib cage.  Occurred without exertion and with exertion.  Stated it had no particular pattern.  Denied any radiation to neck, arms, back, jaw.  Denied any associated nausea, vomiting, or diaphoresis.  She stated she had been told she needed to see a gastroenterologist in the past.  She had some reflux symptoms.  She denied the pain occurred after eating.  Also complained of some lower leg pain when walking specifically in the calves and thighs which was relieved with rest.  Otherwise she denied any progressive anginal or exertional symptoms, palpitations or arrhythmias, orthostatic symptoms, stroke or  TIA-like symptoms, lower extremity edema.  Last visit she had more complaints of leg pain/leg numbness/leg tingling.  Also complained of pain under her left breast aggravated by taking deep breaths.  She stated having pain right leg more so than left.  She stated she had a cardiac catheterization in 2019 which apparently resulted in the Angio-Seal closure device migrating and causing limb ischemia.  She had surgery to remove the Angio-Seal.  She does have diabetes and may have neuropathy.  She has tried gabapentin in the past which made her drowsy and did not tolerate.  She has spinal degenerative disease in her cervical area.  Recently saw orthopedics who recommended she see [place] for her leg pain.  At last visit her Crestor was increased due to LDL not at goal.  She had an ABI study after last visit which was normal demonstrating no arterial disease.  She continued to smoke but stated she was trying to cut down.  Patient is here for follow-up today.  Patient states she is feeling a fair amount of fatigue and feeling tired most of the time recently.  Her heart rate is slow at 46 today.  Also complaining of some numbness and tingling in her left arm.  She had a previous LS spine x-ray showing spondylosis at the L5-S1 region.  More than likely she she has some degenerative disease in her neck which is causing some radicular numbness and tingling in her left arm.  Advised her to talk to PCP have him order all  possible cervical spine x-ray.  She has a significant history of smoking approximately 30+ years.  She has some mild to moderate dyspnea.  She states she thinks she needs to be referred to a pulmonologist to assess her lungs.  She denies any anginal symptoms, orthostatic symptoms, CVA-like symptoms, PND, orthopnea, bleeding issues, claudication-like symptoms, DVT or PE-like symptoms, or lower extremity edema.  States she does have occasional palpitations which are not bothersome.  States she has nearly quit  smoking she is using a vaping apparatus currently.  Advised her to have PCP draw a TSH, B12 and vitamin D levels to see if this may be contributing to her fatigue.   Past Medical History:  Diagnosis Date   Asthma    CAD (coronary artery disease) 04/06/2015   CABG with LIMA-LAD   Diabetes mellitus without complication ([place])    Myocardial infarction ([place])    Approx 2014 per pt report, no further details available    Past Surgical History:  Procedure Laterality Date   ABDOMINAL HYSTERECTOMY     APPENDECTOMY     CARDIAC CATHETERIZATION N/A 04/08/2015   Procedure: Left Heart Cath and Coronary Angiography;  Surgeon: Jettie Booze, MD; LAD 95%, single vessel disease    CORONARY ARTERY BYPASS GRAFT N/A 04/11/2015   Procedure: CORONARY ARTERY BYPASS GRAFTING (CABG) x one using left internal mamary artery. ;  Surgeon: Ivin Poot, MD;  LIMA-LAD   TEE WITHOUT CARDIOVERSION N/A 04/11/2015   Procedure: TRANSESOPHAGEAL ECHOCARDIOGRAM (TEE);  Surgeon: Ivin Poot, MD;  Location: [facility];  Service: Open Heart Surgery;  Laterality: N/A;    Current Outpatient Medications  Medication Sig Dispense Refill   albuterol (PROVENTIL HFA;VENTOLIN HFA) 108 (90 Base) MCG/ACT inhaler Inhale 1 puff into the lungs every 6 (six) hours as needed for wheezing or shortness of breath. 54 g 3   albuterol (PROVENTIL) (2.5 MG/3ML) 0.083% nebulizer solution Take 3 mLs (2.5 mg total) by nebulization every 6 (six) hours as needed for wheezing or shortness of breath. 75 mL 12   amphetamine-dextroamphetamine (ADDERALL) 20 MG tablet Take 20 mg by mouth 2 (two) times daily.     aspirin EC 81 MG tablet Take 81 mg by mouth daily. Swallow whole.     Blood Glucose Monitoring Suppl (TRUE METRIX METER) w/Device KIT Use as directed 1 kit 0   Cholecalciferol (VITAMIN D3) 50 MCG (2000 UT) TABS Take by mouth.     citalopram (CELEXA) 20 MG tablet Take 20 mg by mouth daily.     gabapentin (NEURONTIN) 300 MG capsule  Take 300 mg by mouth 2 (two) times daily.     glucose blood (TRUE METRIX BLOOD GLUCOSE TEST) test strip Use as instructed 100 each 12   hydrOXYzine (ATARAX/VISTARIL) 25 MG tablet Take 1 tablet (25 mg total) by mouth 3 (three) times daily as needed for anxiety. 30 tablet 0   insulin aspart (NOVOLOG FLEXPEN) 100 UNIT/ML FlexPen As per sliding scale. 15 mL 11   Insulin Glargine (LANTUS) 100 UNIT/ML Solostar Pen Inject 60 Units into the skin at bedtime. 15 pen 6   LORazepam (ATIVAN) 0.5 MG tablet Take 0.5 mg by mouth 2 (two) times daily.     losartan (COZAAR) 50 MG tablet Take 1 tablet (50 mg total) by mouth daily. 90 tablet 3   Melatonin 3 MG TABS Take 1 tablet (3 mg total) by mouth at bedtime as needed. 30 tablet 0   metFORMIN (GLUCOPHAGE) 500 MG tablet Take by mouth 2 (two) times  daily with a meal.     metoprolol (LOPRESSOR) 50 MG tablet Take 1 tablet (50 mg total) by mouth 2 (two) times daily. 60 tablet 3   omeprazole (PRILOSEC) 40 MG capsule Take 40 mg by mouth daily.     rosuvastatin (CRESTOR) 20 MG tablet Take 1 tablet (20 mg total) by mouth daily. 90 tablet 1   tiZANidine (ZANAFLEX) 4 MG tablet Take 4 mg by mouth 2 (two) times daily.     traZODone (DESYREL) 100 MG tablet Take by mouth.     TRUEPLUS LANCETS 28G MISC Use as directed 100 each 12   No current facility-administered medications for this visit.   Allergies:  Oxycodone, Oxycodone-acetaminophen, Strattera [atomoxetine hcl], and Pyridium [phenazopyridine hcl]   Social History: The patient  reports that she has been smoking cigarettes. She started smoking about 40 years ago. She has never used smokeless tobacco. She reports current alcohol use of about 3.0 standard drinks of alcohol per week. She reports that she does not use drugs.   Family History: The patient's family history includes Diabetes in an other family member; Heart disease in an other family member.   ROS:  Please see the history of present illness.  Otherwise, complete review of systems is positive for none.  All other systems are reviewed and negative.   Physical Exam: VS:  BP 138/70    Pulse (!) 46    Ht 5' 4" (1.626 m)    Wt 178 lb 12.8 oz (81.1 kg)    SpO2 98%    BMI 30.69 kg/m , BMI Body mass index is 30.69 kg/m.   Wt Readings from Last 3 Encounters:  10/13/19 178 lb 12.8 oz (81.1 kg)  09/28/19 176 lb (79.8 kg)  09/15/19 176 lb (79.8 kg)    General: Patient appears comfortable at rest. Neck: Supple, no elevated JVP or carotid bruits, no thyromegaly. Lungs: Clear to auscultation, nonlabored breathing at rest. Cardiac: Regular rate and rhythm, no S3 or significant systolic murmur, no pericardial rub. Extremities: No pitting edema, distal pulses 2+. Skin: Warm and dry. Musculoskeletal: No kyphosis. Neuropsychiatric: Alert and oriented x3, affect grossly appropriate.  ECG: September 15, 2019 EKG today shows normal sinus rhythm rate of 72.  Recent Labwork: 12/14/2018: ALT 14; AST 14; Hemoglobin 14.5; Platelets 293 09/21/2019: BUN 16; Creatinine, Ser 0.69; Potassium 4.0; Sodium 137     Component Value Date/Time   CHOL 183 12/14/2018 1146   TRIG 171 (H) 12/14/2018 1146   HDL 49 12/14/2018 1146   CHOLHDL 3.7 12/14/2018 1146   CHOLHDL 4.3 04/06/2015 1816   VLDL 53 (H) 04/06/2015 1816   LDLCALC 104 (H) 12/14/2018 1146    Other Studies Reviewed Today:  Lower arterial duplex 08/10/2019 Summary:  Right: Resting right ankle-brachial index is within normal range. No  evidence of significant right lower extremity arterial disease. The right  toe-brachial index is normal.   Left: Resting left ankle-brachial index is within normal range. No  evidence of significant left lower extremity arterial disease. The left  toe-brachial index is normal.   Cardiac catheterization 11/18/2017 conclusions: Obstructive native one-vessel coronary artery disease. Status post CABG with one-to-one grafts patent. Preserved left ventricular systolic  function with regional wall motion as above.  Angio-Seal closure of right common femoral artery.  Echo: 04/08/2015 - Left ventricle: Distal septal hypokinesis The cavity size was  mildly dilated. Wall thickness was normal. Systolic function was  normal. The estimated ejection fraction was in the range of 50%  to  55%. Doppler parameters are consistent with elevated  ventricular end-diastolic filling pressure. - Aortic valve: There was mild regurgitation. - Atrial septum: No defect or patent foramen ovale was identified.  CATH: 04/08/2015  Ost LAD to Prox LAD lesion, 95% stenosed.  Severe tortuosity in the right subclavian made torquing catheters quite difficult. Would not use the right radial in the future for access site for cardiac cath. Severe ostial LAD stenosis. There is no landing zone for stent. Fixing this percutaneously would require leaving stents in the distal left main and putting a large ramus and circumflex at risk to be jailed. She would also likely have to be on lifelong clopidogrel. I think a better option would be to have a surgical consult for possible LIMA to LAD. She has a good target vessel in the mid to distal LAD.  Assessment and Plan:   1. CAD in native artery At last visit she was complaining of intermittent pain under her left breast aggravated by deep breaths.  No current anginal symptoms.  Continue aspirin 81 mg.    2. Essential hypertension Blood pressure of 138/70 today.  Continue losartan 50 mg daily.  3. Hyperlipidemia LDL goal <70 Continue Crestor 20 mg daily.  Last LDL 7 months ago 104.  Goal is less than 70.  Given concurrent history of diabetes we need to encourage dose of Crestor.  Decrease Crestor to 20 mg daily due to possible statin associated muscle symptoms.  4. Tobacco use Patient states she has cut way back on cigarettes.  She is occasionally vaping.  Advised cessation if possible  5. Palpitations States she has occasional  palpitations but not bothersome and very sporadic.  She is complaining of increased fatigue.  Decrease metoprolol to 25 mg p.o. twice daily secondary to slow heart rate of 46.  6. Epigastric pain Patient describes epigastric pain/left below the rib cage and axillary region.  Not associated with activity.  No aggravating or alleviating factors.  No associated nausea, vomiting, or diaphoresis.  No radiation.  States she was told before she has gastric reflux symptoms and needed to see a GI specialist.  She was referred to [facility] gastroenterology at last visit.  She takes PPI omeprazole 40 mg daily.  7.  Leg pain  Recent lower arterial study with ABIs showed normal ABIs on left and right with normal TBI's on the left and right.  Patient states she had a previous cardiac catheterization in 2019 with access through her right groin.  She stated she had   emergency surgery on the right leg after the cardiac catheterization secondary to reduced blood flow.  Apparently she had an angio seal placed which may have migrated ultimately requiring removal.  She states that leg has not been right since then.  Denies any leg pain today.  8.  History of smoking with associated history of dyspnea Long history of smoking with associated dyspnea.  She continues to smoke but does also vape.  She states she believes she needs pulmonary evaluation.  Please refer to [facility] pulmonology  Medication Adjustments/Labs and Tests Ordered: Current medicines are reviewed at length with the patient today.  Concerns regarding medicines are outlined above.   Disposition: Follow-up with Dr. Harl Bowie or APP 6 months  Signed, Levell July, NP 10/13/2019 8:59 AM    [facility] [place], [place], [place] [address] Phone: [phone]; Fax: [phone]

## 2019-10-13 ENCOUNTER — Encounter: Payer: Self-pay | Admitting: Family Medicine

## 2019-10-13 ENCOUNTER — Ambulatory Visit (INDEPENDENT_AMBULATORY_CARE_PROVIDER_SITE_OTHER): Payer: Medicare Other | Admitting: Family Medicine

## 2019-10-13 VITALS — BP 138/70 | HR 46 | Ht 64.0 in | Wt 178.8 lb

## 2019-10-13 DIAGNOSIS — Z720 Tobacco use: Secondary | ICD-10-CM | POA: Diagnosis not present

## 2019-10-13 DIAGNOSIS — E782 Mixed hyperlipidemia: Secondary | ICD-10-CM

## 2019-10-13 DIAGNOSIS — I10 Essential (primary) hypertension: Secondary | ICD-10-CM | POA: Diagnosis not present

## 2019-10-13 DIAGNOSIS — R0602 Shortness of breath: Secondary | ICD-10-CM

## 2019-10-13 DIAGNOSIS — I2510 Atherosclerotic heart disease of native coronary artery without angina pectoris: Secondary | ICD-10-CM

## 2019-10-13 MED ORDER — METOPROLOL TARTRATE 25 MG PO TABS: 25.0000 mg | ORAL_TABLET | Freq: Two times a day (BID) | ORAL | 3 refills | Status: DC

## 2019-10-13 NOTE — Patient Instructions (Addendum)
Medication Instructions:   Your physician has recommended you make the following change in your medication:   Decrease metoprolol tartrate to 25 mg twice daily. You may break your 50 mg tablet in half twice daily until finished.  Continue other medications the same  Labwork:  None  Testing/Procedures:  None  Follow-Up:  Your physician recommends that you schedule a follow-up appointment in: 6 months.  Any Other Special Instructions Will Be Listed Below (If Applicable).  You have been referred to [facility].  If you need a refill on your cardiac medications before your next appointment, please call your pharmacy.

## 2019-10-20 ENCOUNTER — Telehealth: Payer: Self-pay | Admitting: Family Medicine

## 2019-10-20 NOTE — Telephone Encounter (Signed)
Report Intermittent chest pain rated 7.5/10 and sob that started last night. Reports the chest pain is happening more frequently now and is why she called the office. Denies dizziness. Reports cough that is not new. Denies any other respiratory symptoms. Advised to go to the ED for an evaluation. Verbalized understanding of plan.

## 2019-10-20 NOTE — Telephone Encounter (Signed)
Pt c/o of Chest Pain: STAT if CP now or developed within 24 hours  1. Are you having CP right now? Yes   2. Are you experiencing any other symptoms (ex. SOB, nausea, vomiting, sweating)? Patient has asthma. States that her chest is feeling tight.   3. How long have you been experiencing CP?  Off and on since last night.  4. Is your CP continuous or coming and going?  Yes but more frequent   5. Have you taken Nitroglycerin? NO  ?

## 2019-10-23 ENCOUNTER — Other Ambulatory Visit: Payer: Self-pay | Admitting: Orthopaedic Surgery

## 2019-10-23 DIAGNOSIS — M65311 Trigger thumb, right thumb: Secondary | ICD-10-CM | POA: Diagnosis not present

## 2019-10-23 DIAGNOSIS — M65331 Trigger finger, right middle finger: Secondary | ICD-10-CM | POA: Diagnosis not present

## 2019-10-23 MED ORDER — HYDROCODONE-ACETAMINOPHEN 5-325 MG PO TABS: 1.0000 | ORAL_TABLET | Freq: Four times a day (QID) | ORAL | 0 refills | Status: DC | PRN

## 2019-10-26 DIAGNOSIS — W1830XA Fall on same level, unspecified, initial encounter: Secondary | ICD-10-CM | POA: Diagnosis not present

## 2019-10-26 DIAGNOSIS — M1711 Unilateral primary osteoarthritis, right knee: Secondary | ICD-10-CM | POA: Diagnosis not present

## 2019-10-26 DIAGNOSIS — I2510 Atherosclerotic heart disease of native coronary artery without angina pectoris: Secondary | ICD-10-CM | POA: Diagnosis not present

## 2019-10-26 DIAGNOSIS — S80211A Abrasion, right knee, initial encounter: Secondary | ICD-10-CM | POA: Diagnosis not present

## 2019-10-26 DIAGNOSIS — Z951 Presence of aortocoronary bypass graft: Secondary | ICD-10-CM | POA: Diagnosis not present

## 2019-10-26 DIAGNOSIS — S8001XA Contusion of right knee, initial encounter: Secondary | ICD-10-CM | POA: Diagnosis not present

## 2019-10-26 DIAGNOSIS — S8991XA Unspecified injury of right lower leg, initial encounter: Secondary | ICD-10-CM | POA: Diagnosis not present

## 2019-10-26 DIAGNOSIS — I10 Essential (primary) hypertension: Secondary | ICD-10-CM | POA: Diagnosis not present

## 2019-10-26 DIAGNOSIS — M25461 Effusion, right knee: Secondary | ICD-10-CM | POA: Diagnosis not present

## 2019-10-26 DIAGNOSIS — F17210 Nicotine dependence, cigarettes, uncomplicated: Secondary | ICD-10-CM | POA: Diagnosis not present

## 2019-10-26 DIAGNOSIS — Z743 Need for continuous supervision: Secondary | ICD-10-CM | POA: Diagnosis not present

## 2019-10-26 DIAGNOSIS — W19XXXA Unspecified fall, initial encounter: Secondary | ICD-10-CM | POA: Diagnosis not present

## 2019-10-26 DIAGNOSIS — E119 Type 2 diabetes mellitus without complications: Secondary | ICD-10-CM | POA: Diagnosis not present

## 2019-10-26 DIAGNOSIS — S8391XA Sprain of unspecified site of right knee, initial encounter: Secondary | ICD-10-CM | POA: Diagnosis not present

## 2019-10-26 DIAGNOSIS — R609 Edema, unspecified: Secondary | ICD-10-CM | POA: Diagnosis not present

## 2019-10-26 DIAGNOSIS — M25561 Pain in right knee: Secondary | ICD-10-CM | POA: Diagnosis not present

## 2019-10-27 DIAGNOSIS — M25461 Effusion, right knee: Secondary | ICD-10-CM | POA: Diagnosis not present

## 2019-10-27 DIAGNOSIS — S8991XA Unspecified injury of right lower leg, initial encounter: Secondary | ICD-10-CM | POA: Diagnosis not present

## 2019-10-27 DIAGNOSIS — M1711 Unilateral primary osteoarthritis, right knee: Secondary | ICD-10-CM | POA: Diagnosis not present

## 2019-10-27 DIAGNOSIS — S80211A Abrasion, right knee, initial encounter: Secondary | ICD-10-CM | POA: Diagnosis not present

## 2019-11-02 ENCOUNTER — Other Ambulatory Visit: Payer: Self-pay

## 2019-11-02 ENCOUNTER — Ambulatory Visit (INDEPENDENT_AMBULATORY_CARE_PROVIDER_SITE_OTHER): Payer: Medicare Other | Admitting: Orthopaedic Surgery

## 2019-11-02 ENCOUNTER — Encounter: Payer: Self-pay | Admitting: Orthopaedic Surgery

## 2019-11-02 VITALS — Ht 64.0 in | Wt 178.0 lb

## 2019-11-02 DIAGNOSIS — M65331 Trigger finger, right middle finger: Secondary | ICD-10-CM

## 2019-11-02 NOTE — Progress Notes (Signed)
Patient returns post trigger finger release.  1 suture already came out she is already been washing dishes.  Her finger been triggering for more than a year.  She lacks 2 cm touching distal palmar crease and we buddy taped ring to long finger that she can do intermittently during the day to help work with finger flexion.  She had some partial rotator cuff tendinopathy.  Recent fall landed on her knee with abrasion but x-rays were negative for fracture she had some mild knee degeneration.  She can use some ibuprofen for her knee we reviewed the x-ray images today.  She is happy with the results of trigger finger release no further triggering office follow-up here as needed.  Suture harvested today.

## 2019-11-15 DIAGNOSIS — Z79899 Other long term (current) drug therapy: Secondary | ICD-10-CM | POA: Diagnosis not present

## 2019-11-30 ENCOUNTER — Ambulatory Visit (INDEPENDENT_AMBULATORY_CARE_PROVIDER_SITE_OTHER): Payer: Medicare Other | Admitting: Internal Medicine

## 2019-11-30 ENCOUNTER — Encounter: Payer: Self-pay | Admitting: Internal Medicine

## 2019-11-30 ENCOUNTER — Institutional Professional Consult (permissible substitution): Payer: Medicare Other | Admitting: Internal Medicine

## 2019-11-30 ENCOUNTER — Other Ambulatory Visit: Payer: Self-pay

## 2019-11-30 VITALS — BP 142/70 | HR 80 | Temp 98.0°F | Ht 66.0 in | Wt 178.2 lb

## 2019-11-30 DIAGNOSIS — J45991 Cough variant asthma: Secondary | ICD-10-CM | POA: Diagnosis not present

## 2019-11-30 DIAGNOSIS — I10 Essential (primary) hypertension: Secondary | ICD-10-CM | POA: Diagnosis not present

## 2019-11-30 DIAGNOSIS — R0600 Dyspnea, unspecified: Secondary | ICD-10-CM | POA: Diagnosis not present

## 2019-11-30 DIAGNOSIS — F17210 Nicotine dependence, cigarettes, uncomplicated: Secondary | ICD-10-CM | POA: Diagnosis not present

## 2019-11-30 MED ORDER — ALBUTEROL SULFATE HFA 108 (90 BASE) MCG/ACT IN AERS: INHALATION_SPRAY | RESPIRATORY_TRACT | 1 refills | Status: DC

## 2019-11-30 MED ORDER — OLMESARTAN MEDOXOMIL-HCTZ 20-12.5 MG PO TABS: 1.0000 | ORAL_TABLET | Freq: Every day | ORAL | 11 refills | Status: DC

## 2019-11-30 NOTE — Patient Instructions (Addendum)
Stop lisinopril and sart olmesartan  20-12.5 mg one daily in its place   Take omeprazole 40  Take 30-60 min before first meal of the day     Plan B = Backup (to supplement plan A, not to replace it) Only use your albuterol inhaler as a rescue medication to be used if you can't catch your breath by resting or doing a relaxed purse lip breathing pattern.  - The less you use it, the better it will work when you need it. - Ok to use the inhaler up to 2 puffs  every 4 hours if you must but call for appointment if use goes up over your usual need - Don't leave home without it !!  (think of it like the spare tire for your car)   Plan C = Crisis (instead of Plan B but only if Plan B stops working) - only use your albuterol nebulizer if you first try Plan B and it fails to help > ok to use the nebulizer up to every 4 hours but if start needing it regularly call for immediate appointment   Ok to try albuterol 15 min before an activity that you know would make you short of breath and see if it makes any difference and if makes none then don't take it after activity unless you can't catch your breath.  The key is to stop smoking completely before smoking completely stops you!    Please schedule a follow up office visit in 6 weeks with pfts

## 2019-11-30 NOTE — Assessment & Plan Note (Addendum)
Onset around 2012 ? While on acei  - d/c acei 11/30/2019   The hx of asthma on set p dx of dm calls into question when she was place on acei and if this really asthma vs uacs = Upper airway cough syndrome (previously labeled PNDS),  is so named because it's frequently impossible to sort out how much is  CR/sinusitis with freq throat clearing (which can be related to primary GERD)   vs  causing  secondary (" extra esophageal")  GERD from wide swings in gastric pressure that occur with throat clearing, often  promoting self use of mint and menthol lozenges that reduce the lower esophageal sphincter tone and exacerbate the problem further in a cyclical fashion.   These are the same pts (now being labeled as having "irritable larynx syndrome" by some cough centers) who not infrequently have a history of having failed to tolerate ace inhibitors,  dry powder inhalers or biphosphonates or report having atypical/extraesophageal reflux symptoms that don't respond to standard doses of PPI  and are easily confused as having aecopd or asthma flares by even experienced allergists/ pulmonologists (myself included).   >>> first step is stop acei then return for pfts    In the meantime, re use of saba: I spent extra time with pt today reviewing appropriate use of albuterol for prn use on exertion with the following points: 1) saba is for relief of sob that does not improve by walking a slower pace or resting but rather if the pt does not improve after trying this first. 2) If the pt is convinced, as many are, that saba helps recover from activity faster then it's easy to tell if this is the case by re-challenging : ie stop, take the inhaler, then p 5 minutes try the exact same activity (intensity of workload) that just caused the symptoms and see if they are substantially diminished or not after saba 3) if there is an activity that reproducibly causes the symptoms, try the saba 15 min before the activity on alternate  days   If in fact the saba really does help, then fine to continue to use it prn but advised may need to look closer at the maintenance regimen being used to achieve better control of airways disease with exertion.

## 2019-11-30 NOTE — Assessment & Plan Note (Signed)
Counseled re importance of smoking cessation but did not meet time criteria for separate billing   °

## 2019-11-30 NOTE — Assessment & Plan Note (Signed)
D/c acei 11/30/2019 due to ? Pseudoasthma ?  In the best review of chronic cough to date ( NEJM 2016 375 [phone]) ,  ACEi are now felt to cause cough in up to  20% of pts which is a 4 fold increase from previous reports and does not include the variety of non-specific complaints we see in pulmonary clinic in pts on ACEi but previously attributed to another dx like  Copd/asthma and  include PNDS, throat and chest congestion, "bronchitis", unexplained dyspnea and noct "strangling" choking/ gagging sensations, and hoarseness, but also  atypical /refractory GERD symptoms like dysphagia and "bad heartburn"   The only way I know  to prove this is not an "ACEi Case" is a trial off ACEi x a minimum of 6 weeks then regroup.   >>> try olmesartan 20-12.5 one daily           Each maintenance medication was reviewed in detail including emphasizing most importantly the difference between maintenance and prns and under what circumstances the prns are to be triggered using an action plan format where appropriate.  Total time for H and P, chart review, counseling, teaching device and generating customized AVS unique to this new pt office visit / charting = 67 min

## 2019-11-30 NOTE — Progress Notes (Signed)
Kimberly [name], female    DOB: [birth year],    MRN: [id number]   Brief patient profile:  62 yobf active smoker born in [place] first noticed difficulty with sob during junior high but able to do Track  freshman year and changed due to doe to shorter track but  Dx around 2010 while in [place] dx  dm ? When lisinopril added and w/in several years started needing inhalers which never corrected problem and referred to pulmonary clinic in [place]  11/30/2019 by Dr   Ivery Quale      History of Present Illness  11/30/2019  Pulmonary/ 1st office eval/Kimberly [name]  Chief Complaint  Patient presents with  . Consult    non productive cough,shortness of breath with activity for years   Dyspnea:  50 ft Cough: variable/ gagging /throat clogged supine  Sleep: occ wakes from sound sleep SABA use: neb at hs / sometimes p exertion, never rechallenges or prechallenges   No obvious day to day or daytime variability or assoc excess/ purulent sputum or mucus plugs or hemoptysis or cp or chest tightness, subjective wheeze or overt sinus or hb symptoms.    Also denies any obvious fluctuation of symptoms with weather or environmental changes or other aggravating or alleviating factors except as outlined above   No unusual exposure hx or h/o childhood pna/ asthma or knowledge of premature birth.  Current Allergies, Complete Past Medical History, Past Surgical History, Family History, and Social History were reviewed in [employer] record.  ROS  The following are not active complaints unless bolded Hoarseness, sore throat, dysphagia, dental problems, itching, sneezing,  nasal congestion or discharge of excess mucus or purulent secretions, ear ache,   fever, chills, sweats, unintended wt loss or wt gain, classically pleuritic or exertional cp,  orthopnea pnd or arm/hand swelling  or leg swelling, presyncope, palpitations, abdominal pain, anorexia, nausea, vomiting, diarrhea  or change in bowel habits  or change in bladder habits, change in stools or change in urine, dysuria, hematuria,  rash, arthralgias, visual complaints, headache, numbness, weakness or ataxia or problems with walking or coordination,  change in mood or  memory.            Past Medical History:  Diagnosis Date  . Asthma   . CAD (coronary artery disease) 04/06/2015   CABG with LIMA-LAD  . Diabetes mellitus without complication ([place])   . Myocardial infarction ([facility])    Approx 2014 per pt report, no further details available    Outpatient Medications Prior to Visit  Medication Sig Dispense Refill  . albuterol (PROVENTIL HFA;VENTOLIN HFA) 108 (90 Base) MCG/ACT inhaler Inhale 1 puff into the lungs every 6 (six) hours as needed for wheezing or shortness of breath. 54 g 3  . albuterol (PROVENTIL) (2.5 MG/3ML) 0.083% nebulizer solution Take 3 mLs (2.5 mg total) by nebulization every 6 (six) hours as needed for wheezing or shortness of breath. 75 mL 12  . amphetamine-dextroamphetamine (ADDERALL) 20 MG tablet Take 20 mg by mouth 2 (two) times daily.    Marland Kitchen aspirin EC 81 MG tablet Take 81 mg by mouth daily. Swallow whole.    . Blood Glucose Monitoring Suppl (TRUE METRIX METER) w/Device KIT Use as directed 1 kit 0  . Cholecalciferol (VITAMIN D3) 50 MCG (2000 UT) TABS Take by mouth.    . citalopram (CELEXA) 20 MG tablet Take 20 mg by mouth daily.    Marland Kitchen glucose blood (TRUE METRIX BLOOD GLUCOSE TEST) test strip Use as instructed 100  each 12  . hydrOXYzine (ATARAX/VISTARIL) 25 MG tablet Take 1 tablet (25 mg total) by mouth 3 (three) times daily as needed for anxiety. 30 tablet 0  . insulin aspart (NOVOLOG FLEXPEN) 100 UNIT/ML FlexPen As per sliding scale. 15 mL 11  . insulin glargine (LANTUS) 100 UNIT/ML injection Inject 20 Units into the skin daily. Takes 20 units in the morning    . Insulin Glargine (LANTUS) 100 UNIT/ML Solostar Pen Inject 60 Units into the skin at bedtime. (Patient taking differently: Inject 60 Units into the skin at  bedtime. 60 units at bedtime and) 15 pen 6  . lisinopril (ZESTRIL) 20 MG tablet Take 20 mg by mouth daily.    Marland Kitchen LORazepam (ATIVAN) 0.5 MG tablet Take 0.5 mg by mouth daily.     Marland Kitchen losartan (COZAAR) 25 MG tablet Take 25 mg by mouth daily.    . Melatonin 3 MG TABS Take 1 tablet (3 mg total) by mouth at bedtime as needed. 30 tablet 0  . metFORMIN (GLUCOPHAGE) 500 MG tablet Take by mouth 2 (two) times daily with a meal.    . metoprolol tartrate (LOPRESSOR) 25 MG tablet Take 1 tablet (25 mg total) by mouth 2 (two) times daily. 180 tablet 3  . omeprazole (PRILOSEC) 40 MG capsule Take 40 mg by mouth daily.    . rosuvastatin (CRESTOR) 40 MG tablet Take 40 mg by mouth daily.    Marland Kitchen tiZANidine (ZANAFLEX) 4 MG tablet Take 4 mg by mouth 2 (two) times daily.    . traZODone (DESYREL) 100 MG tablet Take by mouth.    . TRUEPLUS LANCETS 28G MISC Use as directed 100 each 12  . gabapentin (NEURONTIN) 300 MG capsule Take 300 mg by mouth 2 (two) times daily. (Patient not taking: Reported on 11/30/2019)    . HYDROcodone-acetaminophen (NORCO/VICODIN) 5-325 MG tablet Take 1 tablet by mouth every 6 (six) hours as needed for moderate pain. Post op pain (Patient not taking: Reported on 11/02/2019) 20 tablet 0  . losartan (COZAAR) 50 MG tablet Take 1 tablet (50 mg total) by mouth daily. 90 tablet 3  . rosuvastatin (CRESTOR) 20 MG tablet Take 1 tablet (20 mg total) by mouth daily. 90 tablet 1   No facility-administered medications prior to visit.     Objective:     BP (!) 142/70 (BP Location: Left Arm, Cuff Size: Normal)   Pulse 80   Temp 98 F (36.7 C) (Other (Comment)) Comment (Src): wrist  Ht $R'5\' 6"'BK$  (1.676 m)   Wt 178 lb 3.2 oz (80.8 kg)   SpO2 100% Comment: Room air  BMI 28.76 kg/m   SpO2: 100 % (Room air)    HEENT : pt wearing mask not removed for exam due to covid -19 concerns.    NECK :  without JVD/Nodes/TM/ nl carotid upstrokes bilaterally   LUNGS: no acc muscle use,  Nl contour chest which is  clear to A and P bilaterally without cough on insp or exp maneuvers   CV:  RRR  no s3 or murmur or increase in P2, and no edema   ABD:  soft and nontender with nl inspiratory excursion in the supine position. No bruits or organomegaly appreciated, bowel sounds nl  MS:  Nl gait/ ext warm without deformities, calf tenderness, cyanosis or clubbing No obvious joint restrictions   SKIN: warm and dry without lesions    NEURO:  alert, approp, nl sensorium with  no motor or cerebellar deficits apparent.  Assessment   Cough variant asthma vs uacs  Onset around 2012 ? While on acei  - d/c acei 11/30/2019   The hx of asthma on set p dx of dm calls into question when she was place on acei and if this really asthma vs uacs = Upper airway cough syndrome (previously labeled PNDS),  is so named because it's frequently impossible to sort out how much is  CR/sinusitis with freq throat clearing (which can be related to primary GERD)   vs  causing  secondary (" extra esophageal")  GERD from wide swings in gastric pressure that occur with throat clearing, often  promoting self use of mint and menthol lozenges that reduce the lower esophageal sphincter tone and exacerbate the problem further in a cyclical fashion.   These are the same pts (now being labeled as having "irritable larynx syndrome" by some cough centers) who not infrequently have a history of having failed to tolerate ace inhibitors,  dry powder inhalers or biphosphonates or report having atypical/extraesophageal reflux symptoms that don't respond to standard doses of PPI  and are easily confused as having aecopd or asthma flares by even experienced allergists/ pulmonologists (myself included).   >>> first step is stop acei then return for pfts    In the meantime, re use of saba: I spent extra time with pt today reviewing appropriate use of albuterol for prn use on exertion with the following points: 1) saba is for relief of sob that  does not improve by walking a slower pace or resting but rather if the pt does not improve after trying this first. 2) If the pt is convinced, as many are, that saba helps recover from activity faster then it's easy to tell if this is the case by re-challenging : ie stop, take the inhaler, then p 5 minutes try the exact same activity (intensity of workload) that just caused the symptoms and see if they are substantially diminished or not after saba 3) if there is an activity that reproducibly causes the symptoms, try the saba 15 min before the activity on alternate days   If in fact the saba really does help, then fine to continue to use it prn but advised may need to look closer at the maintenance regimen being used to achieve better control of airways disease with exertion.    Essential hypertension D/c acei 11/30/2019 due to ? Pseudoasthma ?  In the best review of chronic cough to date ( NEJM 2016 375 [phone]) ,  ACEi are now felt to cause cough in up to  20% of pts which is a 4 fold increase from previous reports and does not include the variety of non-specific complaints we see in pulmonary clinic in pts on ACEi but previously attributed to another dx like  Copd/asthma and  include PNDS, throat and chest congestion, "bronchitis", unexplained dyspnea and noct "strangling" choking/ gagging sensations, and hoarseness, but also  atypical /refractory GERD symptoms like dysphagia and "bad heartburn"   The only way I know  to prove this is not an "ACEi Case" is a trial off ACEi x a minimum of 6 weeks then regroup.   >>> try olmesartan 20-12.5 one daily       Cigarette smoker Counseled re importance of smoking cessation but did not meet time criteria for separate billing       Each maintenance medication was reviewed in detail including emphasizing most importantly the difference between maintenance and prns and under what circumstances the  prns are to be triggered using an action plan format  where appropriate.  Total time for H and P, chart review, counseling, teaching device and generating customized AVS unique to this new pt office visit / charting = 67 min          Christinia Gully, MD 11/30/2019

## 2019-12-01 DIAGNOSIS — Z1231 Encounter for screening mammogram for malignant neoplasm of breast: Secondary | ICD-10-CM | POA: Diagnosis not present

## 2019-12-01 DIAGNOSIS — M810 Age-related osteoporosis without current pathological fracture: Secondary | ICD-10-CM | POA: Diagnosis not present

## 2019-12-25 ENCOUNTER — Other Ambulatory Visit: Payer: Self-pay | Admitting: Family Medicine

## 2019-12-27 LAB — HM MAMMOGRAPHY

## 2019-12-28 ENCOUNTER — Institutional Professional Consult (permissible substitution): Payer: Medicare Other | Admitting: Internal Medicine

## 2020-01-10 ENCOUNTER — Ambulatory Visit: Payer: Medicare Other | Admitting: Internal Medicine

## 2020-01-10 NOTE — Progress Notes (Deleted)
Kimberly [name], female    DOB: June 28, 1965,    MRN: [id number]   Brief patient profile:  52 yobf active smoker born in [place] first noticed difficulty with sob during junior high but able to do Track  freshman year and changed due to doe to shorter track but  Dx around 2010 while in [place] dx  dm ? When lisinopril added and w/in several years started needing inhalers which never corrected problem and referred to pulmonary clinic in [place]  11/30/2019 by Dr   Ivery Quale      History of Present Illness  11/30/2019  Pulmonary/ 1st office eval/[place] Swallows  Chief Complaint  Patient presents with  . Consult    non productive cough,shortness of breath with activity for years   Dyspnea:  50 ft Cough: variable/ gagging /throat clogged supine  Sleep: occ wakes from sound sleep SABA use: neb at hs / sometimes p exertion, never rechallenges or prechallenges rec Stop lisinopril and sart olmesartan  20-12.5 mg one daily in its place  Take omeprazole 40  Take 30-60 min before first meal of the day  Plan B = Backup (to supplement plan A, not to replace it) Only use your albuterol inhaler as a rescue medication  Plan C = Crisis (instead of Plan B but only if Plan B stops working) - only use your albuterol nebulizer if you first try Plan B and it fails to help > ok to use the nebulizer up to every 4 hours but if start needing it regularly call for immediate appointment Ok to try albuterol 15 min before an activity that you know would make you short of breath  The key is to stop smoking completely before smoking completely stops you! Please schedule a follow up office visit in 6 weeks with pfts      01/10/2020  f/u ov/[place] office/Kimberly [name] re:  No chief complaint on file.    Dyspnea:  *** Cough: *** Sleeping: *** SABA use: *** 02: ***   No obvious day to day or daytime variability or assoc excess/ purulent sputum or mucus plugs or hemoptysis or cp or chest tightness, subjective wheeze or overt  sinus or hb symptoms.   *** without nocturnal  or early am exacerbation  of respiratory  c/o's or need for noct saba. Also denies any obvious fluctuation of symptoms with weather or environmental changes or other aggravating or alleviating factors except as outlined above   No unusual exposure hx or h/o childhood pna/ asthma or knowledge of premature birth.  Current Allergies, Complete Past Medical History, Past Surgical History, Family History, and Social History were reviewed in [employer] record.  ROS  The following are not active complaints unless bolded Hoarseness, sore throat, dysphagia, dental problems, itching, sneezing,  nasal congestion or discharge of excess mucus or purulent secretions, ear ache,   fever, chills, sweats, unintended wt loss or wt gain, classically pleuritic or exertional cp,  orthopnea pnd or arm/hand swelling  or leg swelling, presyncope, palpitations, abdominal pain, anorexia, nausea, vomiting, diarrhea  or change in bowel habits or change in bladder habits, change in stools or change in urine, dysuria, hematuria,  rash, arthralgias, visual complaints, headache, numbness, weakness or ataxia or problems with walking or coordination,  change in mood or  memory.        No outpatient medications have been marked as taking for the 01/10/20 encounter (Appointment) with Tanda Rockers, MD.  Past Medical History:  Diagnosis Date  . Asthma   . CAD (coronary artery disease) 04/06/2015   CABG with LIMA-LAD  . Diabetes mellitus without complication ([facility])   . Myocardial infarction ([facility])    Approx 2014 per pt report, no further details available        Objective:     Wt Readings from Last 3 Encounters:  11/30/19 178 lb 3.2 oz (80.8 kg)  11/02/19 178 lb (80.7 kg)  10/13/19 178 lb 12.8 oz (81.1 kg)     Vital signs reviewed  01/10/2020  - Note at rest 02 sats  ***% on ***   General appearance:    ***               Assessment

## 2020-01-22 DIAGNOSIS — I10 Essential (primary) hypertension: Secondary | ICD-10-CM | POA: Diagnosis not present

## 2020-01-22 DIAGNOSIS — J40 Bronchitis, not specified as acute or chronic: Secondary | ICD-10-CM | POA: Diagnosis not present

## 2020-01-22 DIAGNOSIS — E1143 Type 2 diabetes mellitus with diabetic autonomic (poly)neuropathy: Secondary | ICD-10-CM | POA: Diagnosis not present

## 2020-01-22 DIAGNOSIS — E785 Hyperlipidemia, unspecified: Secondary | ICD-10-CM | POA: Diagnosis not present

## 2020-01-23 LAB — AMB EXT HGBA1C
Hemoglobin A1C, External: 8.9 %
Hemoglobin A1c, External: 8.9 %

## 2020-02-15 ENCOUNTER — Other Ambulatory Visit: Payer: Self-pay

## 2020-02-15 ENCOUNTER — Ambulatory Visit: Payer: Medicare Other | Admitting: Orthopaedic Surgery

## 2020-02-21 DIAGNOSIS — Z9114 Patient's other noncompliance with medication regimen: Secondary | ICD-10-CM | POA: Diagnosis not present

## 2020-02-21 DIAGNOSIS — Z79899 Other long term (current) drug therapy: Secondary | ICD-10-CM | POA: Diagnosis not present

## 2020-02-22 DIAGNOSIS — H35032 Hypertensive retinopathy, left eye: Secondary | ICD-10-CM | POA: Diagnosis not present

## 2020-02-29 ENCOUNTER — Ambulatory Visit: Payer: Self-pay

## 2020-02-29 ENCOUNTER — Other Ambulatory Visit: Payer: Self-pay

## 2020-02-29 ENCOUNTER — Encounter: Payer: Self-pay | Admitting: Orthopaedic Surgery

## 2020-02-29 ENCOUNTER — Ambulatory Visit (INDEPENDENT_AMBULATORY_CARE_PROVIDER_SITE_OTHER): Payer: Medicare Other | Admitting: Orthopaedic Surgery

## 2020-02-29 VITALS — Ht 66.0 in | Wt 174.0 lb

## 2020-02-29 DIAGNOSIS — M25561 Pain in right knee: Secondary | ICD-10-CM

## 2020-02-29 DIAGNOSIS — M25512 Pain in left shoulder: Secondary | ICD-10-CM

## 2020-02-29 DIAGNOSIS — G8929 Other chronic pain: Secondary | ICD-10-CM

## 2020-02-29 NOTE — Progress Notes (Signed)
Office Visit Note   Patient: Kimberly [name]           Date of Birth: [birth year]           MRN: [id number] Visit Date: 02/29/2020              Requested by: Neale Burly, MD [place],  [place] [address] PCP: Neale Burly, MD   Assessment & Plan: Visit Diagnoses:  1. Chronic left shoulder pain   2. Chronic pain of right knee     Plan: Patient will return in 1 week she will have some mild stridor when she wants to take her Ativan before she gets an injection since she has severe anxiety with injections.  Plan of the single knee injection right knee.  She can use Aspercreme topically on her knee.  Follow-Up Instructions: No follow-ups on file.   Orders:  Orders Placed This Encounter  Procedures  . XR KNEE 3 VIEW RIGHT  . XR Shoulder Left   No orders of the defined types were placed in this encounter.     Procedures: No procedures performed   Clinical Data: No additional findings.   Subjective: Chief Complaint  Patient presents with  . Right Knee - Pain  . Left Shoulder - Pain    HPI postop right hand middle trigger finger release doing well from last fall.  She states her right shoulder is doing well but now she is having increased pain left shoulder with some stiffness and discomfort.  She is also progressive problems with her right knee with repetitive locking of the knee particularly when she turns and she feels like it may give way.  She has not actually fallen.  Patient is a diabetic.  Review of Systems positive for independent diabetes.  Right knee osteoarthritis.  Asthma hyperlipidemia previous CABG procedure.  All other systems are negative.   Objective: Vital Signs: Ht 5\' 6"  (1.676 m)   Wt 174 lb (78.9 kg)   BMI 28.08 kg/m   Physical Exam Constitutional:      Appearance: She is well-developed.  HENT:     Head: Normocephalic.     Right Ear: External ear normal.     Left Ear: External ear normal.  Eyes:     Pupils: Pupils are  equal, round, and reactive to light.  Neck:     Thyroid: No thyromegaly.     Trachea: No tracheal deviation.  Cardiovascular:     Rate and Rhythm: Normal rate.  Pulmonary:     Effort: Pulmonary effort is normal.  Abdominal:     Palpations: Abdomen is soft.  Skin:    General: Skin is warm and dry.  Neurological:     Mental Status: She is alert and oriented to person, place, and time.  Psychiatric:        Mood and Affect: Mood and affect normal.        Behavior: Behavior normal.     Ortho Exam patient has 2+ knee effusion right knee.  Crepitus with knee range of motion palpable osteophytes.  Negative logroll of the hips.  Opposite left knee has full range of motion.  Right knee reaches full extension with pain.  Flexion to 110 degrees.  She can get her left shoulder up overhead.  Negative drop arm test internal/external rotation strength is good no subluxation.  Specialty Comments:  No specialty comments available.  Imaging: No results found.   PMFS History: Patient Active Problem List  Diagnosis Date Noted  . Essential hypertension 11/30/2019  . Impingement syndrome of right shoulder 08/10/2019  . Trigger finger, right middle finger 12/28/2018  . Hyperlipidemia associated with type 2 diabetes mellitus ([place]) 05/06/2015  . Adjustment reaction with mixed emotional features 04/22/2015  . S/P CABG x 1 04/11/2015  . Unstable angina ([facility])   . Hypertensive heart disease without heart failure   . Diabetes mellitus, type 2 ([place]) 04/06/2015  . Hyperlipidemia 04/06/2015  . ADHD (attention deficit hyperactivity disorder) 04/06/2015  . Cigarette smoker 04/06/2015  . Cough variant asthma vs uacs  04/06/2015   Past Medical History:  Diagnosis Date  . Asthma   . CAD (coronary artery disease) 04/06/2015   CABG with LIMA-LAD  . Diabetes mellitus without complication ([place])   . Myocardial infarction ([place])    Approx 2014 per pt report, no further details available    Family History   Problem Relation Age of Onset  . Heart disease Other        grandfather  . Diabetes Other        mother    Past Surgical History:  Procedure Laterality Date  . ABDOMINAL HYSTERECTOMY    . APPENDECTOMY    . CARDIAC CATHETERIZATION N/A 04/08/2015   Procedure: Left Heart Cath and Coronary Angiography;  Surgeon: Jettie Booze, MD; LAD 95%, single vessel disease   . CORONARY ARTERY BYPASS GRAFT N/A 04/11/2015   Procedure: CORONARY ARTERY BYPASS GRAFTING (CABG) x one using left internal mamary artery. ;  Surgeon: Ivin Poot, MD;  LIMA-LAD  . TEE WITHOUT CARDIOVERSION N/A 04/11/2015   Procedure: TRANSESOPHAGEAL ECHOCARDIOGRAM (TEE);  Surgeon: Ivin Poot, MD;  Location: [facility];  Service: Open Heart Surgery;  Laterality: N/A;   Social History   Occupational History  . Not on file  Tobacco Use  . Smoking status: Current Every Day Smoker    Packs/day: 0.50    Years: 42.00    Pack years: 21.00    Types: Cigarettes    Start date: 09/14/1979  . Smokeless tobacco: Never Used  . Tobacco comment: smokes 6 cigarettes per day --11/30/2019  Vaping Use  . Vaping Use: Every day  . Start date: 09/13/2019  Substance and Sexual Activity  . Alcohol use: Yes    Alcohol/week: 3.0 standard drinks    Types: 3 Glasses of wine per week    Comment: occasional  . Drug use: No  . Sexual activity: Not on file

## 2020-03-07 ENCOUNTER — Ambulatory Visit: Payer: Medicare Other | Admitting: Orthopaedic Surgery

## 2020-03-07 ENCOUNTER — Other Ambulatory Visit: Payer: Self-pay

## 2020-04-22 DIAGNOSIS — M19011 Primary osteoarthritis, right shoulder: Secondary | ICD-10-CM | POA: Diagnosis not present

## 2020-04-22 DIAGNOSIS — M7581 Other shoulder lesions, right shoulder: Secondary | ICD-10-CM | POA: Diagnosis not present

## 2020-04-22 DIAGNOSIS — M1711 Unilateral primary osteoarthritis, right knee: Secondary | ICD-10-CM | POA: Diagnosis not present

## 2020-04-22 DIAGNOSIS — M4722 Other spondylosis with radiculopathy, cervical region: Secondary | ICD-10-CM | POA: Diagnosis not present

## 2020-04-24 DIAGNOSIS — I10 Essential (primary) hypertension: Secondary | ICD-10-CM | POA: Diagnosis not present

## 2020-04-24 DIAGNOSIS — Z79899 Other long term (current) drug therapy: Secondary | ICD-10-CM | POA: Diagnosis not present

## 2020-04-24 DIAGNOSIS — E785 Hyperlipidemia, unspecified: Secondary | ICD-10-CM | POA: Diagnosis not present

## 2020-04-24 DIAGNOSIS — E1143 Type 2 diabetes mellitus with diabetic autonomic (poly)neuropathy: Secondary | ICD-10-CM | POA: Diagnosis not present

## 2020-05-03 DIAGNOSIS — M542 Cervicalgia: Secondary | ICD-10-CM | POA: Diagnosis not present

## 2020-05-03 DIAGNOSIS — M25561 Pain in right knee: Secondary | ICD-10-CM | POA: Diagnosis not present

## 2020-05-03 DIAGNOSIS — G8929 Other chronic pain: Secondary | ICD-10-CM | POA: Diagnosis not present

## 2020-05-03 DIAGNOSIS — M25511 Pain in right shoulder: Secondary | ICD-10-CM | POA: Diagnosis not present

## 2020-05-03 DIAGNOSIS — M1711 Unilateral primary osteoarthritis, right knee: Secondary | ICD-10-CM | POA: Diagnosis not present

## 2020-05-03 DIAGNOSIS — R200 Anesthesia of skin: Secondary | ICD-10-CM | POA: Diagnosis not present

## 2020-05-03 DIAGNOSIS — M4722 Other spondylosis with radiculopathy, cervical region: Secondary | ICD-10-CM | POA: Diagnosis not present

## 2020-05-03 DIAGNOSIS — M19011 Primary osteoarthritis, right shoulder: Secondary | ICD-10-CM | POA: Diagnosis not present

## 2020-05-03 DIAGNOSIS — M7581 Other shoulder lesions, right shoulder: Secondary | ICD-10-CM | POA: Diagnosis not present

## 2020-05-09 DIAGNOSIS — M1711 Unilateral primary osteoarthritis, right knee: Secondary | ICD-10-CM | POA: Diagnosis not present

## 2020-05-09 DIAGNOSIS — M7581 Other shoulder lesions, right shoulder: Secondary | ICD-10-CM | POA: Diagnosis not present

## 2020-05-09 DIAGNOSIS — R200 Anesthesia of skin: Secondary | ICD-10-CM | POA: Diagnosis not present

## 2020-05-09 DIAGNOSIS — M19011 Primary osteoarthritis, right shoulder: Secondary | ICD-10-CM | POA: Diagnosis not present

## 2020-05-09 DIAGNOSIS — G8929 Other chronic pain: Secondary | ICD-10-CM | POA: Diagnosis not present

## 2020-05-09 DIAGNOSIS — M542 Cervicalgia: Secondary | ICD-10-CM | POA: Diagnosis not present

## 2020-05-09 DIAGNOSIS — M4722 Other spondylosis with radiculopathy, cervical region: Secondary | ICD-10-CM | POA: Diagnosis not present

## 2020-05-09 DIAGNOSIS — M25561 Pain in right knee: Secondary | ICD-10-CM | POA: Diagnosis not present

## 2020-05-09 DIAGNOSIS — M25511 Pain in right shoulder: Secondary | ICD-10-CM | POA: Diagnosis not present

## 2020-05-14 DIAGNOSIS — Z13820 Encounter for screening for osteoporosis: Secondary | ICD-10-CM | POA: Diagnosis not present

## 2020-05-14 DIAGNOSIS — M810 Age-related osteoporosis without current pathological fracture: Secondary | ICD-10-CM | POA: Diagnosis not present

## 2020-05-15 DIAGNOSIS — M7581 Other shoulder lesions, right shoulder: Secondary | ICD-10-CM | POA: Diagnosis not present

## 2020-05-15 DIAGNOSIS — M25561 Pain in right knee: Secondary | ICD-10-CM | POA: Diagnosis not present

## 2020-05-15 DIAGNOSIS — R200 Anesthesia of skin: Secondary | ICD-10-CM | POA: Diagnosis not present

## 2020-05-15 DIAGNOSIS — M19011 Primary osteoarthritis, right shoulder: Secondary | ICD-10-CM | POA: Diagnosis not present

## 2020-05-15 DIAGNOSIS — M25511 Pain in right shoulder: Secondary | ICD-10-CM | POA: Diagnosis not present

## 2020-05-15 DIAGNOSIS — G8929 Other chronic pain: Secondary | ICD-10-CM | POA: Diagnosis not present

## 2020-05-15 DIAGNOSIS — M542 Cervicalgia: Secondary | ICD-10-CM | POA: Diagnosis not present

## 2020-05-15 DIAGNOSIS — M1711 Unilateral primary osteoarthritis, right knee: Secondary | ICD-10-CM | POA: Diagnosis not present

## 2020-05-15 DIAGNOSIS — M4722 Other spondylosis with radiculopathy, cervical region: Secondary | ICD-10-CM | POA: Diagnosis not present

## 2020-05-17 DIAGNOSIS — M7581 Other shoulder lesions, right shoulder: Secondary | ICD-10-CM | POA: Diagnosis not present

## 2020-05-17 DIAGNOSIS — M25561 Pain in right knee: Secondary | ICD-10-CM | POA: Diagnosis not present

## 2020-05-17 DIAGNOSIS — G8929 Other chronic pain: Secondary | ICD-10-CM | POA: Diagnosis not present

## 2020-05-17 DIAGNOSIS — M1711 Unilateral primary osteoarthritis, right knee: Secondary | ICD-10-CM | POA: Diagnosis not present

## 2020-05-17 DIAGNOSIS — M542 Cervicalgia: Secondary | ICD-10-CM | POA: Diagnosis not present

## 2020-05-17 DIAGNOSIS — M19011 Primary osteoarthritis, right shoulder: Secondary | ICD-10-CM | POA: Diagnosis not present

## 2020-05-17 DIAGNOSIS — M4722 Other spondylosis with radiculopathy, cervical region: Secondary | ICD-10-CM | POA: Diagnosis not present

## 2020-05-17 DIAGNOSIS — M25511 Pain in right shoulder: Secondary | ICD-10-CM | POA: Diagnosis not present

## 2020-05-17 DIAGNOSIS — R200 Anesthesia of skin: Secondary | ICD-10-CM | POA: Diagnosis not present

## 2020-05-22 DIAGNOSIS — R200 Anesthesia of skin: Secondary | ICD-10-CM | POA: Diagnosis not present

## 2020-05-22 DIAGNOSIS — M1711 Unilateral primary osteoarthritis, right knee: Secondary | ICD-10-CM | POA: Diagnosis not present

## 2020-05-22 DIAGNOSIS — M7581 Other shoulder lesions, right shoulder: Secondary | ICD-10-CM | POA: Diagnosis not present

## 2020-05-22 DIAGNOSIS — M19011 Primary osteoarthritis, right shoulder: Secondary | ICD-10-CM | POA: Diagnosis not present

## 2020-05-22 DIAGNOSIS — M4722 Other spondylosis with radiculopathy, cervical region: Secondary | ICD-10-CM | POA: Diagnosis not present

## 2020-05-22 DIAGNOSIS — G8929 Other chronic pain: Secondary | ICD-10-CM | POA: Diagnosis not present

## 2020-05-22 DIAGNOSIS — M542 Cervicalgia: Secondary | ICD-10-CM | POA: Diagnosis not present

## 2020-05-22 DIAGNOSIS — M25561 Pain in right knee: Secondary | ICD-10-CM | POA: Diagnosis not present

## 2020-05-22 DIAGNOSIS — M25511 Pain in right shoulder: Secondary | ICD-10-CM | POA: Diagnosis not present

## 2020-05-27 DIAGNOSIS — Z79899 Other long term (current) drug therapy: Secondary | ICD-10-CM | POA: Diagnosis not present

## 2020-06-16 NOTE — Progress Notes (Deleted)
Cardiology Office Note  Date: 06/16/2020   ID: Kimberly [name], DOB 1965-09-06, MRN [id number]  PCP:  Toma Deiters, MD  Cardiologist:  None Electrophysiologist:  None   Chief Complaint:  follow-up CAD, HTN, HLD, tobacco abuse, palpitations  History of Present Illness: Kimberly [name] is a 55 y.o. female with a history of CAD (one-vessel CABG with LIMA to LAD 04/11/2015, HTN, HLD, tobacco abuse, COPD, IDDM, palpitations.  Last visit with Dr. Purvis Sheffield 01/02/2019.  She denied any exertional chest pain or dyspnea.  She did complain of palpitations.  Stated she had a stress test and cardiac catheterization   She required emergency vascular surgery of right femoral artery.  She smokes 6 to 7 cigarettes/day.  Crestor was increased to 20 mg.  She was on both lisinopril and losartan at last visit.  Lisinopril was discontinued and losartan was increased to 50 mg daily.  Plans were to attempt to obtain a copy of stress test, event monitor and cardiac catheterization report.  Her blood pressure was elevated at last visit.  As mentioned above her losartan was increased to 50 mg Lopressor was increased to 50 mg p.o. twice daily.  LDL was not at goal.  Crestor was increased to 20 mg daily  Presented last visit with some complaints of epigastric pain which radiated around under her left rib cage.  Occurred without exertion and with exertion.  Stated it had no particular pattern.  Denied any radiation to neck, arms, back, jaw.  Denied any associated nausea, vomiting, or diaphoresis.  She stated she had been told she needed to see a gastroenterologist in the past.  She had some reflux symptoms.  She denied the pain occurred after eating.  Also complained of some lower leg pain when walking specifically in the calves and thighs which was relieved with rest.  Otherwise she denied any progressive anginal or exertional symptoms, palpitations or arrhythmias, orthostatic symptoms, stroke or TIA-like symptoms, lower  extremity edema.  Last visit she had more complaints of leg pain/leg numbness/leg tingling.  Also complained of pain under her left breast aggravated by taking deep breaths.  She stated having pain right leg more so than left.  She stated she had a cardiac catheterization in 2019 which apparently resulted in the Angio-Seal closure device migrating and causing limb ischemia.  She had surgery to remove the Angio-Seal.  She does have diabetes and may have neuropathy.  She has tried gabapentin in the past which made her drowsy and did not tolerate.  She has spinal degenerative disease in her cervical area.  Recently saw orthopedics who recommended she see [place] for her leg pain.  At last visit her Crestor was increased due to LDL not at goal.  She had an ABI study after last visit which was normal demonstrating no arterial disease.  She continued to smoke but stated she was trying to cut down.  Patient is here for follow-up today.  Patient states she is feeling a fair amount of fatigue and feeling tired most of the time recently.  Her heart rate is slow at 46 today.  Also complaining of some numbness and tingling in her left arm.  She had a previous LS spine x-ray showing spondylosis at the L5-S1 region.  More than likely she she has some degenerative disease in her neck which is causing some radicular numbness and tingling in her left arm.  Advised her to talk to PCP have him order all possible cervical spine x-ray.  She  has a significant history of smoking approximately 30+ years.  She has some mild to moderate dyspnea.  She states she thinks she needs to be referred to a pulmonologist to assess her lungs.  She denies any anginal symptoms, orthostatic symptoms, CVA-like symptoms, PND, orthopnea, bleeding issues, claudication-like symptoms, DVT or PE-like symptoms, or lower extremity edema.  States she does have occasional palpitations which are not bothersome.  States she has nearly quit smoking she is using a vaping  apparatus currently.  Advised her to have PCP draw a TSH, B12 and vitamin D levels to see if this may be contributing to her fatigue.   Past Medical History:  Diagnosis Date  . Asthma   . CAD (coronary artery disease) 04/06/2015   CABG with LIMA-LAD  . Diabetes mellitus without complication ([place])   . Myocardial infarction ([place])    Approx 2014 per pt report, no further details available    Past Surgical History:  Procedure Laterality Date  . ABDOMINAL HYSTERECTOMY    . APPENDECTOMY    . CARDIAC CATHETERIZATION N/A 04/08/2015   Procedure: Left Heart Cath and Coronary Angiography;  Surgeon: Jettie Booze, MD; LAD 95%, single vessel disease   . CORONARY ARTERY BYPASS GRAFT N/A 04/11/2015   Procedure: CORONARY ARTERY BYPASS GRAFTING (CABG) x one using left internal mamary artery. ;  Surgeon: Ivin Poot, MD;  LIMA-LAD  . TEE WITHOUT CARDIOVERSION N/A 04/11/2015   Procedure: TRANSESOPHAGEAL ECHOCARDIOGRAM (TEE);  Surgeon: Ivin Poot, MD;  Location: [facility];  Service: Open Heart Surgery;  Laterality: N/A;    Current Outpatient Medications  Medication Sig Dispense Refill  . albuterol (PROAIR HFA) 108 (90 Base) MCG/ACT inhaler 2 puffs every 4 hours as needed only  if your can't catch your breath 18 g 1  . albuterol (PROVENTIL HFA;VENTOLIN HFA) 108 (90 Base) MCG/ACT inhaler Inhale 1 puff into the lungs every 6 (six) hours as needed for wheezing or shortness of breath. 54 g 3  . albuterol (PROVENTIL) (2.5 MG/3ML) 0.083% nebulizer solution Take 3 mLs (2.5 mg total) by nebulization every 6 (six) hours as needed for wheezing or shortness of breath. 75 mL 12  . amphetamine-dextroamphetamine (ADDERALL) 20 MG tablet Take 20 mg by mouth 2 (two) times daily.    Marland Kitchen aspirin EC 81 MG tablet Take 81 mg by mouth daily. Swallow whole.    . Blood Glucose Monitoring Suppl (TRUE METRIX METER) w/Device KIT Use as directed 1 kit 0  . Cholecalciferol (VITAMIN D3) 50 MCG (2000 UT) TABS Take by mouth.    .  citalopram (CELEXA) 20 MG tablet Take 20 mg by mouth daily.    Marland Kitchen glucose blood (TRUE METRIX BLOOD GLUCOSE TEST) test strip Use as instructed 100 each 12  . HYDROcodone-acetaminophen (NORCO/VICODIN) 5-325 MG tablet Take 1 tablet by mouth every 6 (six) hours as needed for moderate pain. Post op pain (Patient not taking: No sig reported) 20 tablet 0  . hydrOXYzine (ATARAX/VISTARIL) 25 MG tablet Take 1 tablet (25 mg total) by mouth 3 (three) times daily as needed for anxiety. 30 tablet 0  . insulin aspart (NOVOLOG FLEXPEN) 100 UNIT/ML FlexPen As per sliding scale. 15 mL 11  . insulin glargine (LANTUS) 100 UNIT/ML injection Inject 20 Units into the skin daily. Takes 20 units in the morning    . Insulin Glargine (LANTUS) 100 UNIT/ML Solostar Pen Inject 60 Units into the skin at bedtime. (Patient taking differently: Inject 60 Units into the skin at bedtime. 60 units at  bedtime and) 15 pen 6  . LORazepam (ATIVAN) 0.5 MG tablet Take 0.5 mg by mouth daily.     Marland Kitchen losartan (COZAAR) 100 MG tablet Take 100 mg by mouth daily.    . Melatonin 3 MG TABS Take 1 tablet (3 mg total) by mouth at bedtime as needed. 30 tablet 0  . metFORMIN (GLUCOPHAGE) 500 MG tablet Take by mouth 2 (two) times daily with a meal.    . metoprolol tartrate (LOPRESSOR) 25 MG tablet Take 1 tablet (25 mg total) by mouth 2 (two) times daily. 180 tablet 3  . olmesartan-hydrochlorothiazide (BENICAR HCT) 20-12.5 MG tablet Take 1 tablet by mouth daily. 30 tablet 11  . omeprazole (PRILOSEC) 40 MG capsule Take 40 mg by mouth daily.    . rosuvastatin (CRESTOR) 40 MG tablet TAKE 1 TABLET BY MOUTH EVERY DAY 90 tablet 2  . tiZANidine (ZANAFLEX) 4 MG tablet Take 4 mg by mouth 2 (two) times daily.    . traZODone (DESYREL) 100 MG tablet Take by mouth.    . TRUEPLUS LANCETS 28G MISC Use as directed 100 each 12   No current facility-administered medications for this visit.   Allergies:  Atomoxetine, Oxycodone, Oxycodone-acetaminophen, Phenazopyridine,  Strattera [atomoxetine hcl], and Pyridium [phenazopyridine hcl]   Social History: The patient  reports that she has been smoking cigarettes. She started smoking about 40 years ago. She has a 21.00 pack-year smoking history. She has never used smokeless tobacco. She reports current alcohol use of about 3.0 standard drinks of alcohol per week. She reports that she does not use drugs.   Family History: The patient's family history includes Diabetes in an other family member; Heart disease in an other family member.   ROS:  Please see the history of present illness. Otherwise, complete review of systems is positive for none.  All other systems are reviewed and negative.   Physical Exam: VS:  There were no vitals taken for this visit., BMI There is no height or weight on file to calculate BMI.   Wt Readings from Last 3 Encounters:  02/29/20 174 lb (78.9 kg)  11/30/19 178 lb 3.2 oz (80.8 kg)  11/02/19 178 lb (80.7 kg)    General: Patient appears comfortable at rest. Neck: Supple, no elevated JVP or carotid bruits, no thyromegaly. Lungs: Clear to auscultation, nonlabored breathing at rest. Cardiac: Regular rate and rhythm, no S3 or significant systolic murmur, no pericardial rub. Extremities: No pitting edema, distal pulses 2+. Skin: Warm and dry. Musculoskeletal: No kyphosis. Neuropsychiatric: Alert and oriented x3, affect grossly appropriate.  ECG: September 15, 2019 EKG today shows normal sinus rhythm rate of 72.  Recent Labwork: 09/21/2019: BUN 16; Creatinine, Ser 0.69; Potassium 4.0; Sodium 137     Component Value Date/Time   CHOL 183 12/14/2018 1146   TRIG 171 (H) 12/14/2018 1146   HDL 49 12/14/2018 1146   CHOLHDL 3.7 12/14/2018 1146   CHOLHDL 4.3 04/06/2015 1816   VLDL 53 (H) 04/06/2015 1816   LDLCALC 104 (H) 12/14/2018 1146    Other Studies Reviewed Today:  Lower arterial duplex 08/10/2019 Summary:  Right: Resting right ankle-brachial index is within normal range. No   evidence of significant right lower extremity arterial disease. The right  toe-brachial index is normal.   Left: Resting left ankle-brachial index is within normal range. No  evidence of significant left lower extremity arterial disease. The left  toe-brachial index is normal.   Cardiac catheterization 11/18/2017 conclusions: Obstructive native one-vessel coronary artery disease. Status post  CABG with one-to-one grafts patent. Preserved left ventricular systolic function with regional wall motion as above.  Angio-Seal closure of right common femoral artery.  Echo: 04/08/2015 - Left ventricle: Distal septal hypokinesis The cavity size was  mildly dilated. Wall thickness was normal. Systolic function was  normal. The estimated ejection fraction was in the range of 50%  to 55%. Doppler parameters are consistent with elevated  ventricular end-diastolic filling pressure. - Aortic valve: There was mild regurgitation. - Atrial septum: No defect or patent foramen ovale was identified.  CATH: 04/08/2015  Ost LAD to Prox LAD lesion, 95% stenosed.  Severe tortuosity in the right subclavian made torquing catheters quite difficult. Would not use the right radial in the future for access site for cardiac cath. Severe ostial LAD stenosis. There is no landing zone for stent. Fixing this percutaneously would require leaving stents in the distal left main and putting a large ramus and circumflex at risk to be jailed. She would also likely have to be on lifelong clopidogrel. I think a better option would be to have a surgical consult for possible LIMA to LAD. She has a good target vessel in the mid to distal LAD.  Assessment and Plan:   1. CAD in native artery At last visit she was complaining of intermittent pain under her left breast aggravated by deep breaths.  No current anginal symptoms.  Continue aspirin 81 mg.    2. Essential hypertension Blood pressure of 138/70 today.  Continue  losartan 50 mg daily.  3. Hyperlipidemia LDL goal <70 Continue Crestor 20 mg daily.  Last LDL 7 months ago 104.  Goal is less than 70.  Given concurrent history of diabetes we need to encourage dose of Crestor.  Decrease Crestor to 20 mg daily due to possible statin associated muscle symptoms.  4. Tobacco use Patient states she has cut way back on cigarettes.  She is occasionally vaping.  Advised cessation if possible  5. Palpitations States she has occasional palpitations but not bothersome and very sporadic.  She is complaining of increased fatigue.  Decrease metoprolol to 25 mg p.o. twice daily secondary to slow heart rate of 46.  6. Epigastric pain Patient describes epigastric pain/left below the rib cage and axillary region.  Not associated with activity.  No aggravating or alleviating factors.  No associated nausea, vomiting, or diaphoresis.  No radiation.  States she was told before she has gastric reflux symptoms and needed to see a GI specialist.  She was referred to [facility] gastroenterology at last visit.  She takes PPI omeprazole 40 mg daily.  7.  Leg pain  Recent lower arterial study with ABIs showed normal ABIs on left and right with normal TBI's on the left and right.  Patient states she had a previous cardiac catheterization in 2019 with access through her right groin.  She stated she had   emergency surgery on the right leg after the cardiac catheterization secondary to reduced blood flow.  Apparently she had an angio seal placed which may have migrated ultimately requiring removal.  She states that leg has not been right since then.  Denies any leg pain today.  8.  History of smoking with associated history of dyspnea Long history of smoking with associated dyspnea.  She continues to smoke but does also vape.  She states she believes she needs pulmonary evaluation.  Please refer to [facility] pulmonology  Medication Adjustments/Labs and Tests Ordered: Current medicines are  reviewed at length with the patient today.  Concerns regarding medicines are outlined above.   Disposition: Follow-up with Dr. Harl Bowie or APP 6 months  Signed, Levell July, NP 06/16/2020 6:16 PM    [facility] [place], [place], [place] [address] Phone: [phone]; Fax: [phone]

## 2020-06-17 ENCOUNTER — Ambulatory Visit: Payer: Medicare Other | Admitting: Family Medicine

## 2020-06-24 DIAGNOSIS — Z79899 Other long term (current) drug therapy: Secondary | ICD-10-CM | POA: Diagnosis not present

## 2020-07-11 DIAGNOSIS — E1143 Type 2 diabetes mellitus with diabetic autonomic (poly)neuropathy: Secondary | ICD-10-CM | POA: Diagnosis not present

## 2020-07-11 DIAGNOSIS — E7849 Other hyperlipidemia: Secondary | ICD-10-CM | POA: Diagnosis not present

## 2020-07-11 DIAGNOSIS — I10 Essential (primary) hypertension: Secondary | ICD-10-CM | POA: Diagnosis not present

## 2020-07-22 DIAGNOSIS — Z79899 Other long term (current) drug therapy: Secondary | ICD-10-CM | POA: Diagnosis not present

## 2020-08-10 ENCOUNTER — Other Ambulatory Visit: Payer: Self-pay | Admitting: Family Medicine

## 2020-09-02 DIAGNOSIS — M542 Cervicalgia: Secondary | ICD-10-CM | POA: Diagnosis not present

## 2020-09-02 DIAGNOSIS — E1143 Type 2 diabetes mellitus with diabetic autonomic (poly)neuropathy: Secondary | ICD-10-CM | POA: Diagnosis not present

## 2020-09-02 DIAGNOSIS — I10 Essential (primary) hypertension: Secondary | ICD-10-CM | POA: Diagnosis not present

## 2020-09-02 DIAGNOSIS — E785 Hyperlipidemia, unspecified: Secondary | ICD-10-CM | POA: Diagnosis not present

## 2020-09-02 DIAGNOSIS — Z0000 Encounter for general adult medical examination without abnormal findings: Secondary | ICD-10-CM | POA: Diagnosis not present

## 2020-09-02 DIAGNOSIS — Z13220 Encounter for screening for lipoid disorders: Secondary | ICD-10-CM | POA: Diagnosis not present

## 2020-09-04 ENCOUNTER — Encounter (INDEPENDENT_AMBULATORY_CARE_PROVIDER_SITE_OTHER): Payer: Self-pay | Admitting: *Deleted

## 2020-09-09 DIAGNOSIS — Z79899 Other long term (current) drug therapy: Secondary | ICD-10-CM | POA: Diagnosis not present

## 2020-09-12 NOTE — Progress Notes (Signed)
Cardiology Office Note  Date: 09/13/2020   ID: Kimberly [name], DOB 04/30/1965, MRN [id number]  PCP:  Toma Deiters, MD  Cardiologist:  None Electrophysiologist:  None   Chief Complaint: 28-month follow-up  History of Present Illness: Kimberly [name] is a 55 y.o. female with a history of CAD (one-vessel CABG with LIMA to LAD 04/11/2015, HTN, HLD, tobacco abuse, COPD, IDDM, palpitations.  She was last here for follow-up.  She was feeling a fair amount of fatigue and feeling tired most of the time recently.  Her heart rate was slow at 46 bpm.  Also plain of some numbness and tingling in her left arm.  She had a previous LS spine x-ray showing spondylosis at the L5-S1 region.  Significant history of smoking approximately 30+ years.  She had some mild to moderate dyspnea.  She thought she needed to be referred to a pulmonologist to assess her lungs.  She denied any anginal symptoms, orthostatic symptoms, CVA-like symptoms, PND, orthopnea, bleeding issues, claudication-like symptoms, DVT or PE-like symptoms, or lower extremity edema.  She was having occasional palpitations which are not bothersome.  Stated she had nearly quit smoking.  She is using a vaping apparatus currently.  Advised her to have PCP draw a TSH, B12 and vitamin D levels to see if this may be contributing to her fatigue.  She is here for 49-month follow-up today.  She states she continues with some DOE/SOB.  History of long-term smoking.  She states she is recently resorted to vaping versus smoking.  States has been having some issues with her diabetes.  Recent hemoglobin A1c per her statement was 10.1%.  States she has been history of having issues with her back with sciatica on her right side with some radiation down her right leg.  She also states she has been having some pain in her right groin at the site where she previously had an emergent surgery status post cardiac catheterization in the past.  She states she is having some pain  in that right leg more so than left especially in the groin area where catheter access site was located..  She had a lower extremity arterial study last yearIn July which showed right ABI within normal range.  Right TBI was normal.  Left ABI was normal, left TBI was normal.  Apparently she did not follow-up with pulmonary referral suggested at last visit.  She states she would like to be referred today.  She denies any anginal symptoms.  She does have chronic dyspnea.  Denies any orthostatic symptoms, CVA or TIA-like symptoms, PND, orthopnea, bleeding.  Denies any claudication-like symptoms, DVT or PE-like symptoms.   Past Medical History:  Diagnosis Date   Asthma    CAD (coronary artery disease) 04/06/2015   CABG with LIMA-LAD   Diabetes mellitus without complication (HCC)    Myocardial infarction (HCC)    Approx 2014 per pt report, no further details available    Past Surgical History:  Procedure Laterality Date   ABDOMINAL HYSTERECTOMY     APPENDECTOMY     CARDIAC CATHETERIZATION N/A 04/08/2015   Procedure: Left Heart Cath and Coronary Angiography;  Surgeon: Corky Crafts, MD; LAD 95%, single vessel disease    CORONARY ARTERY BYPASS GRAFT N/A 04/11/2015   Procedure: CORONARY ARTERY BYPASS GRAFTING (CABG) x one using left internal mamary artery. ;  Surgeon: Kerin Perna, MD;  LIMA-LAD   TEE WITHOUT CARDIOVERSION N/A 04/11/2015   Procedure: TRANSESOPHAGEAL ECHOCARDIOGRAM (TEE);  Surgeon: Kathlee Nations  Kerby Less, MD;  Location: [facility];  Service: Open Heart Surgery;  Laterality: N/A;    Current Outpatient Medications  Medication Sig Dispense Refill   albuterol (PROAIR HFA) 108 (90 Base) MCG/ACT inhaler 2 puffs every 4 hours as needed only  if your can't catch your breath 18 g 1   albuterol (PROVENTIL HFA;VENTOLIN HFA) 108 (90 Base) MCG/ACT inhaler Inhale 1 puff into the lungs every 6 (six) hours as needed for wheezing or shortness of breath. 54 g 3   albuterol (PROVENTIL) (2.5 MG/3ML) 0.083%  nebulizer solution Take 3 mLs (2.5 mg total) by nebulization every 6 (six) hours as needed for wheezing or shortness of breath. 75 mL 12   amphetamine-dextroamphetamine (ADDERALL) 20 MG tablet Take 20 mg by mouth 2 (two) times daily.     aspirin EC 81 MG tablet Take 81 mg by mouth daily. Swallow whole.     Blood Glucose Monitoring Suppl (TRUE METRIX METER) w/Device KIT Use as directed 1 kit 0   Cholecalciferol (VITAMIN D3) 50 MCG (2000 UT) TABS Take by mouth.     citalopram (CELEXA) 20 MG tablet Take 20 mg by mouth daily.     cloNIDine (CATAPRES) 0.1 MG tablet Take 0.1 mg by mouth 2 (two) times daily.     glucose blood (TRUE METRIX BLOOD GLUCOSE TEST) test strip Use as instructed 100 each 12   HYDROcodone-acetaminophen (NORCO/VICODIN) 5-325 MG tablet Take 1 tablet by mouth every 6 (six) hours as needed for moderate pain. Post op pain 20 tablet 0   hydrOXYzine (ATARAX/VISTARIL) 25 MG tablet Take 1 tablet (25 mg total) by mouth 3 (three) times daily as needed for anxiety. 30 tablet 0   insulin aspart (NOVOLOG FLEXPEN) 100 UNIT/ML FlexPen As per sliding scale. 15 mL 11   insulin glargine (LANTUS) 100 UNIT/ML injection Inject 20 Units into the skin daily. Takes 20 units in the morning     Insulin Glargine (LANTUS) 100 UNIT/ML Solostar Pen Inject 60 Units into the skin at bedtime. (Patient taking differently: Inject 60 Units into the skin at bedtime. 60 units at bedtime and) 15 pen 6   LORazepam (ATIVAN) 0.5 MG tablet Take 0.5 mg by mouth daily.      losartan (COZAAR) 100 MG tablet Take 100 mg by mouth daily.     Melatonin 3 MG TABS Take 1 tablet (3 mg total) by mouth at bedtime as needed. 30 tablet 0   metoprolol tartrate (LOPRESSOR) 25 MG tablet TAKE 1 TABLET BY MOUTH TWICE A DAY 180 tablet 0   olmesartan-hydrochlorothiazide (BENICAR HCT) 20-12.5 MG tablet Take 1 tablet by mouth daily. 30 tablet 11   omeprazole (PRILOSEC) 40 MG capsule Take 40 mg by mouth daily.     rosuvastatin (CRESTOR) 40 MG  tablet TAKE 1 TABLET BY MOUTH EVERY DAY 90 tablet 0   tiZANidine (ZANAFLEX) 4 MG tablet Take 4 mg by mouth 2 (two) times daily.     traZODone (DESYREL) 100 MG tablet Take by mouth.     TRUEPLUS LANCETS 28G MISC Use as directed 100 each 12   metFORMIN (GLUCOPHAGE) 500 MG tablet Take by mouth 2 (two) times daily with a meal. (Patient not taking: Reported on 09/13/2020)     No current facility-administered medications for this visit.   Allergies:  Atomoxetine, Oxycodone, Oxycodone-acetaminophen, Phenazopyridine, Strattera [atomoxetine hcl], and Pyridium [phenazopyridine hcl]   Social History: The patient  reports that she has been smoking cigarettes. She started smoking about 41 years ago. She has a 21.00  pack-year smoking history. She has never used smokeless tobacco. She reports current alcohol use of about 3.0 standard drinks per week. She reports that she does not use drugs.   Family History: The patient's family history includes Diabetes in an other family member; Heart disease in an other family member.   ROS:  Please see the history of present illness. Otherwise, complete review of systems is positive for none.  All other systems are reviewed and negative.   Physical Exam: VS:  BP 130/70   Pulse (!) 54   Ht $R'5\' 6"'hO$  (1.676 m)   Wt 174 lb 9.3 oz (79.2 kg)   SpO2 97%   BMI 28.18 kg/m , BMI Body mass index is 28.18 kg/m.   Wt Readings from Last 3 Encounters:  09/13/20 174 lb 9.3 oz (79.2 kg)  02/29/20 174 lb (78.9 kg)  11/30/19 178 lb 3.2 oz (80.8 kg)    General: Patient appears comfortable at rest. Neck: Supple, no elevated JVP or carotid bruits, no thyromegaly. Lungs: Clear to auscultation, nonlabored breathing at rest. Cardiac: Regular rate and rhythm, no S3 or significant systolic murmur, no pericardial rub. Extremities: No pitting edema, distal pulses 2+. Skin: Warm and dry. Musculoskeletal: No kyphosis. Neuropsychiatric: Alert and oriented x3, affect grossly  appropriate.  ECG: 09/13/2020 EKG sinus bradycardia with a rate of 54.  Recent Labwork: 09/21/2019: BUN 16; Creatinine, Ser 0.69; Potassium 4.0; Sodium 137     Component Value Date/Time   CHOL 183 12/14/2018 1146   TRIG 171 (H) 12/14/2018 1146   HDL 49 12/14/2018 1146   CHOLHDL 3.7 12/14/2018 1146   CHOLHDL 4.3 04/06/2015 1816   VLDL 53 (H) 04/06/2015 1816   LDLCALC 104 (H) 12/14/2018 1146    Other Studies Reviewed Today:  Lower arterial duplex 08/10/2019 Summary:  Right: Resting right ankle-brachial index is within normal range. No  evidence of significant right lower extremity arterial disease. The right  toe-brachial index is normal.   Left: Resting left ankle-brachial index is within normal range. No  evidence of significant left lower extremity arterial disease. The left  toe-brachial index is normal.   Cardiac catheterization 11/18/2017 conclusions: Obstructive native one-vessel coronary artery disease. Status post CABG with one-to-one grafts patent. Preserved left ventricular systolic function with regional wall motion as above.  Angio-Seal closure of right common femoral artery.    Echo: 04/08/2015 - Left ventricle: Distal septal hypokinesis The cavity size was   mildly dilated. Wall thickness was normal. Systolic function was   normal. The estimated ejection fraction was in the range of 50%   to 55%. Doppler parameters are consistent with elevated   ventricular end-diastolic filling pressure. - Aortic valve: There was mild regurgitation. - Atrial septum: No defect or patent foramen ovale was identified.   CATH: 04/08/2015 Ost LAD to Prox LAD lesion, 95% stenosed. Severe tortuosity in the right subclavian made torquing catheters quite difficult. Would not use the right radial in the future for access site for cardiac cath.  Severe ostial LAD stenosis. There is no landing zone for stent. Fixing this percutaneously would require leaving stents in the distal left main and  putting a large ramus and circumflex at risk to be jailed. She would also likely have to be on lifelong clopidogrel. I think a better option would be to have a surgical consult for possible LIMA to LAD. She has a good target vessel in the mid to distal LAD.   Assessment and Plan:   1. CAD in native artery  At last visit she was complaining of intermittent pain under her left breast aggravated by deep breaths.  Continues to complain of intermittent pain under her left breast.  No classic anginal symptoms.  Denies any radiation or associated symptoms.  States this can occur at rest and not necessarily with activity.  Continue aspirin 81 mg.    2. Essential hypertension Blood pressure of BP today 130/70 continue losartan 50 mg daily.  It appears she is taking clonidine 0.1 mg p.o. twice daily which is not on her medication list.  It appears to have been ordered by her primary care provider  3. Hyperlipidemia LDL goal <70 Continue Crestor 20 mg daily.  Last LDL 7 months ago 104.  Goal is less than 70.  Given concurrent history of diabetes we need to encourage dose of Crestor.    4. Tobacco use Patient states she has cut way back on cigarettes.  She is occasionally vaping.  Advised cessation if possible  5. Palpitations At last visit she complained of occasional palpitations but not bothersome.  Her metoprolol was decreased to 25 mg p.o. twice daily secondary to slow heart rate of 46.  Today her heart rate is 54.    6. Epigastric pain Patient describes epigastric pain/left below the rib cage and axillary region.  Not associated with activity.  No aggravating or alleviating factors.  No associated nausea, vomiting, or diaphoresis.  No radiation.  States she was told before she has gastric reflux symptoms and needed to see a GI specialist.  She was referred to [facility] gastroenterology at last visit.  She takes PPI omeprazole 40 mg daily.  7.  Leg pain  Recent lower arterial study with ABIs showed  normal ABIs on left and right with normal TBI's on the left and right.  Patient states she had a previous cardiac catheterization in 2019 with access through her right groin.  She stated she had   emergency surgery on the right leg after the cardiac catheterization secondary to reduced blood flow.  Apparently she had an angio seal placed which may have migrated ultimately requiring removal.  She states that leg has not been right since then.  Denies any leg pain today.  Please get a lower extremity arterial duplex study  8.  History of smoking with associated history of dyspnea Long history of smoking with associated dyspnea.  She continues to smoke but does also vape.  She saw Dr. Christinia Gully pulmonology on 11/30/2019.  His assessment was cough variant asthma versus upper airway cough syndrome.  He recommended trying olmesartan versus ACE for cough syndrome.  She is currently on losartan..  9.  Shortness of breath She states she has been having some increasing shortness of breath recently.  Please repeat echocardiogram to reassess LV function, diastolic function and valvular function.  States she would like to be referred to pulmonary again for shortness of breath.   Medication Adjustments/Labs and Tests Ordered: Current medicines are reviewed at length with the patient today.  Concerns regarding medicines are outlined above.   Disposition: Follow-up with Dr. Harl Bowie or APP 2 to 3 months  Signed, Levell July, NP 09/13/2020 8:31 AM    [facility], [place], [place] [address] Phone: [phone]; Fax: [phone]

## 2020-09-13 ENCOUNTER — Other Ambulatory Visit: Payer: Self-pay

## 2020-09-13 ENCOUNTER — Encounter: Payer: Self-pay | Admitting: Family Medicine

## 2020-09-13 ENCOUNTER — Telehealth: Payer: Self-pay | Admitting: Family Medicine

## 2020-09-13 ENCOUNTER — Ambulatory Visit (INDEPENDENT_AMBULATORY_CARE_PROVIDER_SITE_OTHER): Payer: Medicare Other | Admitting: Family Medicine

## 2020-09-13 VITALS — BP 130/70 | HR 54 | Ht 66.0 in | Wt 174.6 lb

## 2020-09-13 DIAGNOSIS — M79604 Pain in right leg: Secondary | ICD-10-CM | POA: Diagnosis not present

## 2020-09-13 DIAGNOSIS — F17210 Nicotine dependence, cigarettes, uncomplicated: Secondary | ICD-10-CM | POA: Diagnosis not present

## 2020-09-13 DIAGNOSIS — I10 Essential (primary) hypertension: Secondary | ICD-10-CM | POA: Diagnosis not present

## 2020-09-13 DIAGNOSIS — M79605 Pain in left leg: Secondary | ICD-10-CM | POA: Diagnosis not present

## 2020-09-13 DIAGNOSIS — R0602 Shortness of breath: Secondary | ICD-10-CM | POA: Diagnosis not present

## 2020-09-13 NOTE — Patient Instructions (Addendum)
Medication Instructions:  Your physician recommends that you continue on your current medications as directed. Please refer to the Current Medication list given to you today.  Labwork: none  Testing/Procedures: Your physician has requested that you have an echocardiogram. Echocardiography is a painless test that uses sound waves to create images of your heart. It provides your doctor with information about the size and shape of your heart and how well your heart's chambers and valves are working. This procedure takes approximately one hour. There are no restrictions for this procedure. Your physician has requested that you have a lower extremity arterial exercise duplex. During this test, exercise and ultrasound are used to evaluate arterial blood flow in the legs. Allow one hour for this exam. There are no restrictions or special instructions. Your physician has requested that you have an ankle brachial index (ABI). During this test an ultrasound and blood pressure cuff are used to evaluate the arteries that supply the arms and legs with blood. Allow thirty minutes for this exam. There are no restrictions or special instructions.  Follow-Up: Your physician recommends that you schedule a follow-up appointment in: 2-3 months  Any Other Special Instructions Will Be Listed Below (If Applicable).  If you need a refill on your cardiac medications before your next appointment, please call your pharmacy.

## 2020-09-13 NOTE — Telephone Encounter (Signed)
Checking percert on the following patient for testing scheduled at [facility].     [place] LOWER US ARTERIALS   ECHO   10/03/2020

## 2020-09-23 DIAGNOSIS — R42 Dizziness and giddiness: Secondary | ICD-10-CM | POA: Diagnosis not present

## 2020-09-23 DIAGNOSIS — J209 Acute bronchitis, unspecified: Secondary | ICD-10-CM | POA: Diagnosis not present

## 2020-09-23 DIAGNOSIS — I10 Essential (primary) hypertension: Secondary | ICD-10-CM | POA: Diagnosis not present

## 2020-09-23 DIAGNOSIS — E119 Type 2 diabetes mellitus without complications: Secondary | ICD-10-CM | POA: Diagnosis not present

## 2020-09-23 DIAGNOSIS — Z20822 Contact with and (suspected) exposure to covid-19: Secondary | ICD-10-CM | POA: Diagnosis not present

## 2020-09-23 DIAGNOSIS — I2510 Atherosclerotic heart disease of native coronary artery without angina pectoris: Secondary | ICD-10-CM | POA: Diagnosis not present

## 2020-09-23 DIAGNOSIS — Z87891 Personal history of nicotine dependence: Secondary | ICD-10-CM | POA: Diagnosis not present

## 2020-09-23 DIAGNOSIS — R059 Cough, unspecified: Secondary | ICD-10-CM | POA: Diagnosis not present

## 2020-09-23 DIAGNOSIS — Z951 Presence of aortocoronary bypass graft: Secondary | ICD-10-CM | POA: Diagnosis not present

## 2020-09-23 DIAGNOSIS — R519 Headache, unspecified: Secondary | ICD-10-CM | POA: Diagnosis not present

## 2020-09-23 DIAGNOSIS — Z720 Tobacco use: Secondary | ICD-10-CM | POA: Diagnosis not present

## 2020-09-23 DIAGNOSIS — J40 Bronchitis, not specified as acute or chronic: Secondary | ICD-10-CM | POA: Diagnosis not present

## 2020-10-01 DIAGNOSIS — J40 Bronchitis, not specified as acute or chronic: Secondary | ICD-10-CM | POA: Diagnosis not present

## 2020-10-03 ENCOUNTER — Ambulatory Visit (HOSPITAL_COMMUNITY): Admission: RE | Admit: 2020-10-03 | Payer: Medicare Other | Source: Ambulatory Visit

## 2020-10-03 ENCOUNTER — Other Ambulatory Visit: Payer: Self-pay

## 2020-10-03 ENCOUNTER — Ambulatory Visit (HOSPITAL_COMMUNITY)
Admission: RE | Admit: 2020-10-03 | Discharge: 2020-10-03 | Disposition: A | Discharge status: Home / Self Care | Payer: Medicare Other | Source: Ambulatory Visit | Attending: Family Medicine | Admitting: Family Medicine

## 2020-10-03 DIAGNOSIS — M79604 Pain in right leg: Secondary | ICD-10-CM | POA: Insufficient documentation

## 2020-10-03 DIAGNOSIS — I70211 Atherosclerosis of native arteries of extremities with intermittent claudication, right leg: Secondary | ICD-10-CM | POA: Diagnosis not present

## 2020-10-03 DIAGNOSIS — R0602 Shortness of breath: Secondary | ICD-10-CM | POA: Insufficient documentation

## 2020-10-03 DIAGNOSIS — F17210 Nicotine dependence, cigarettes, uncomplicated: Secondary | ICD-10-CM | POA: Insufficient documentation

## 2020-10-03 DIAGNOSIS — M79605 Pain in left leg: Secondary | ICD-10-CM | POA: Insufficient documentation

## 2020-10-03 LAB — ECHOCARDIOGRAM COMPLETE
AR max vel: 1.68 cm2
AV Area VTI: 1.74 cm2
AV Area mean vel: 1.64 cm2
AV Mean grad: 6 mmHg
AV Peak grad: 11.8 mmHg
Ao pk vel: 1.72 m/s
Area-P 1/2: 3.27 cm2
Calc EF: 50.9 %
MV VTI: 1.44 cm2
S' Lateral: 3.3 cm
Single Plane A2C EF: 54.5 %
Single Plane A4C EF: 51.6 %

## 2020-10-03 NOTE — Progress Notes (Signed)
*  PRELIMINARY RESULTS* Echocardiogram 2D Echocardiogram has been performed.  Kimberly [name] 10/03/2020, 12:32 PM

## 2020-10-04 ENCOUNTER — Telehealth: Payer: Self-pay | Admitting: *Deleted

## 2020-10-04 ENCOUNTER — Encounter: Payer: Self-pay | Admitting: *Deleted

## 2020-10-04 NOTE — Telephone Encounter (Signed)
Laurine Blazer, LPN  9/97/7414 23:95 AM EDT Back to Top    Notified, copy to pcp.

## 2020-10-04 NOTE — Telephone Encounter (Signed)
ECHO -  Please call the patient and let her know the echocardiogram shows she has good pumping function of the heart.  The left main pumping chamber is a little more muscular than normal and stiff.  Best management is to keep blood pressure at or below 130/80 consistently and manage all of her other risk factors .  Shortness of breath is more likely lung related.  She has a history of smoking.  Please encourage her to stop.  Thank you   Verta Ellen, NP  10/03/2020 1:51 PM     ABI -   Please call the patient and let her know there was no evidence in the lower extremities study of any arterial disease to explain her right leg pain.      Verta Ellen, NP  10/03/2020 1:50 PM

## 2020-10-07 ENCOUNTER — Telehealth: Payer: Self-pay | Admitting: Family Medicine

## 2020-10-07 DIAGNOSIS — M1711 Unilateral primary osteoarthritis, right knee: Secondary | ICD-10-CM | POA: Diagnosis not present

## 2020-10-07 DIAGNOSIS — R200 Anesthesia of skin: Secondary | ICD-10-CM | POA: Diagnosis not present

## 2020-10-07 DIAGNOSIS — M722 Plantar fascial fibromatosis: Secondary | ICD-10-CM | POA: Diagnosis not present

## 2020-10-07 DIAGNOSIS — M5417 Radiculopathy, lumbosacral region: Secondary | ICD-10-CM | POA: Diagnosis not present

## 2020-10-07 DIAGNOSIS — R202 Paresthesia of skin: Secondary | ICD-10-CM | POA: Diagnosis not present

## 2020-10-07 NOTE — Telephone Encounter (Signed)
New message     Patients ortho doctor wants to know if its okay for her to take any of these medications : Mobic ? Celebrex, or meloxicam

## 2020-10-07 NOTE — Telephone Encounter (Signed)
Fwd to pharmD for advice.

## 2020-10-07 NOTE — Telephone Encounter (Signed)
They are all NSAIDs which carry a black box warning for increased risk of cardiovascular events including MI and stroke. Pt already has history of CAD s/p CABG in 2017. Would recommend avoiding use of chronic NSAIDs. Ok to use a dose here or there if needed, but would prefer Tylenol instead.

## 2020-10-08 NOTE — Telephone Encounter (Signed)
Patient notified and verbalized understanding. 

## 2020-10-16 DIAGNOSIS — M25512 Pain in left shoulder: Secondary | ICD-10-CM | POA: Diagnosis not present

## 2020-10-17 ENCOUNTER — Ambulatory Visit (INDEPENDENT_AMBULATORY_CARE_PROVIDER_SITE_OTHER): Payer: Medicare Other | Admitting: Adult Health

## 2020-10-17 ENCOUNTER — Other Ambulatory Visit: Payer: Self-pay

## 2020-10-17 ENCOUNTER — Encounter: Payer: Self-pay | Admitting: Adult Health

## 2020-10-17 VITALS — BP 102/61 | HR 50 | Ht 66.0 in | Wt 186.0 lb

## 2020-10-17 DIAGNOSIS — Z90710 Acquired absence of both cervix and uterus: Secondary | ICD-10-CM | POA: Diagnosis not present

## 2020-10-17 DIAGNOSIS — Z124 Encounter for screening for malignant neoplasm of cervix: Secondary | ICD-10-CM

## 2020-10-17 DIAGNOSIS — Z90721 Acquired absence of ovaries, unilateral: Secondary | ICD-10-CM

## 2020-10-17 DIAGNOSIS — Z1211 Encounter for screening for malignant neoplasm of colon: Secondary | ICD-10-CM | POA: Diagnosis not present

## 2020-10-17 DIAGNOSIS — Z8543 Personal history of malignant neoplasm of ovary: Secondary | ICD-10-CM | POA: Insufficient documentation

## 2020-10-17 DIAGNOSIS — Z01419 Encounter for gynecological examination (general) (routine) without abnormal findings: Secondary | ICD-10-CM | POA: Insufficient documentation

## 2020-10-17 LAB — HEMOCCULT GUIAC POC 1CARD (OFFICE): Fecal Occult Blood, POC: NEGATIVE

## 2020-10-17 NOTE — Progress Notes (Signed)
  Subjective:     Patient ID: Kimberly [name], female   DOB: 1965/11/01, 55 y.o.   MRN: [id number]  HPI [name] is a 55 year old black female,married, sp hysterectomy with oophorectomy for ovarian cancer about 30 years ago, in for  pelvic exam. She had physical with PCP. She is a PCA. PCP is Dr Sherrie Sport.  Review of Systems Has sex occasionally  Denies any problems with urination or BMs  Reviewed past medical,surgical, social and family history. Reviewed medications and allergies.     Objective:   Physical Exam BP 102/61 (BP Location: Right Arm, Patient Position: Sitting, Cuff Size: Normal)   Pulse (!) 50   Ht 5\' 6"  (1.676 m)   Wt 186 lb (84.4 kg)   BMI 30.02 kg/m     Skin warm and dry.Pelvic: external genitalia is normal in appearance no lesions, vagina: pink, loss of rugae, vaginal cuff has no lesions,urethra has no lesions or masses noted, cervix and uterus are absent, adnexa: no masses or tenderness noted. Bladder is non tender and no masses felt. On rectal exam, has good tone, no masses felt, and hemoccult is negative.  AA is 4 Fall risk is low Depression screen [facility] 2/9 10/17/2020 12/14/2018 05/31/2015  Decreased Interest 1 1 2   Down, Depressed, Hopeless 1 2 3   PHQ - 2 Score 2 3 5   Altered sleeping 1 2 1   Tired, decreased energy 1 3 2   Change in appetite 0 0 1  Feeling bad or failure about yourself  0 0 2  Trouble concentrating 1 3 3   Moving slowly or fidgety/restless 1 3 2   Suicidal thoughts 0 0 0  PHQ-9 Score 6 14 16     GAD 7 : Generalized Anxiety Score 10/17/2020  Nervous, Anxious, on Edge 1  Control/stop worrying 2  Worry too much - different things 2  Trouble relaxing 2  Restless 1  Easily annoyed or irritable 2  Afraid - awful might happen 0  Total GAD 7 Score 10      Upstream - 10/17/20 1128       Pregnancy Intention Screening   Does the patient want to become pregnant in the next year? No    Does the patient's partner want to become pregnant in the next year?  No    Would the patient like to discuss contraceptive options today? No      Contraception Wrap Up   Current Method Female Sterilization   hyst   End Method Female Sterilization   hyst   Contraception Counseling Provided No             Examination chaperoned by Celene Squibb LPN  Assessment:     1. Visit for pelvic exam No pap needed  Physical with PCP Labs with PCP Mammogram yearly Colonoscopy pending  2. Encounter for screening fecal occult blood testing  - POCT occult blood stool  3. S/P hysterectomy with oophorectomy   4. History of ovarian cancer     Plan:     Follow up prn

## 2020-10-23 DIAGNOSIS — M25512 Pain in left shoulder: Secondary | ICD-10-CM | POA: Diagnosis not present

## 2020-10-23 DIAGNOSIS — M25612 Stiffness of left shoulder, not elsewhere classified: Secondary | ICD-10-CM | POA: Diagnosis not present

## 2020-10-23 DIAGNOSIS — M7552 Bursitis of left shoulder: Secondary | ICD-10-CM | POA: Diagnosis not present

## 2020-10-23 DIAGNOSIS — M75102 Unspecified rotator cuff tear or rupture of left shoulder, not specified as traumatic: Secondary | ICD-10-CM | POA: Diagnosis not present

## 2020-10-23 DIAGNOSIS — G8929 Other chronic pain: Secondary | ICD-10-CM | POA: Diagnosis not present

## 2020-11-08 DIAGNOSIS — M19012 Primary osteoarthritis, left shoulder: Secondary | ICD-10-CM | POA: Diagnosis not present

## 2020-11-08 DIAGNOSIS — G8929 Other chronic pain: Secondary | ICD-10-CM | POA: Diagnosis not present

## 2020-11-08 DIAGNOSIS — M25512 Pain in left shoulder: Secondary | ICD-10-CM | POA: Diagnosis not present

## 2020-11-08 DIAGNOSIS — M25612 Stiffness of left shoulder, not elsewhere classified: Secondary | ICD-10-CM | POA: Diagnosis not present

## 2020-11-11 DIAGNOSIS — M25512 Pain in left shoulder: Secondary | ICD-10-CM | POA: Diagnosis not present

## 2020-11-11 DIAGNOSIS — F17210 Nicotine dependence, cigarettes, uncomplicated: Secondary | ICD-10-CM | POA: Diagnosis not present

## 2020-11-11 DIAGNOSIS — E119 Type 2 diabetes mellitus without complications: Secondary | ICD-10-CM | POA: Diagnosis not present

## 2020-11-11 DIAGNOSIS — M779 Enthesopathy, unspecified: Secondary | ICD-10-CM | POA: Diagnosis not present

## 2020-11-11 DIAGNOSIS — M25511 Pain in right shoulder: Secondary | ICD-10-CM | POA: Diagnosis not present

## 2020-11-11 DIAGNOSIS — I2510 Atherosclerotic heart disease of native coronary artery without angina pectoris: Secondary | ICD-10-CM | POA: Diagnosis not present

## 2020-11-11 DIAGNOSIS — I10 Essential (primary) hypertension: Secondary | ICD-10-CM | POA: Diagnosis not present

## 2020-11-12 DIAGNOSIS — M5412 Radiculopathy, cervical region: Secondary | ICD-10-CM | POA: Diagnosis not present

## 2020-11-13 ENCOUNTER — Ambulatory Visit (INDEPENDENT_AMBULATORY_CARE_PROVIDER_SITE_OTHER): Payer: Medicare Other | Admitting: Internal Medicine

## 2020-11-13 ENCOUNTER — Other Ambulatory Visit: Payer: Self-pay

## 2020-11-13 ENCOUNTER — Ambulatory Visit: Payer: Self-pay | Admitting: Internal Medicine

## 2020-11-13 ENCOUNTER — Ambulatory Visit (HOSPITAL_COMMUNITY)
Admission: RE | Admit: 2020-11-13 | Discharge: 2020-11-13 | Disposition: A | Discharge status: Home / Self Care | Payer: Medicare Other | Source: Ambulatory Visit | Attending: Internal Medicine | Admitting: Internal Medicine

## 2020-11-13 ENCOUNTER — Encounter: Payer: Self-pay | Admitting: Internal Medicine

## 2020-11-13 DIAGNOSIS — J45991 Cough variant asthma: Secondary | ICD-10-CM | POA: Insufficient documentation

## 2020-11-13 DIAGNOSIS — I10 Essential (primary) hypertension: Secondary | ICD-10-CM

## 2020-11-13 DIAGNOSIS — R0602 Shortness of breath: Secondary | ICD-10-CM | POA: Diagnosis not present

## 2020-11-13 DIAGNOSIS — R059 Cough, unspecified: Secondary | ICD-10-CM | POA: Diagnosis not present

## 2020-11-13 DIAGNOSIS — R0609 Other forms of dyspnea: Secondary | ICD-10-CM | POA: Diagnosis not present

## 2020-11-13 NOTE — Assessment & Plan Note (Signed)
D/c acei 11/30/2019 due to ? Pseudoasthma ? > minimally improved 11/13/2020   Although even in retrospect it may not be clear the ACEi contributed to the pt's symptoms, adding them back at this point or in the future would risk confusion in interpretation of non-specific respiratory symptoms to which this patient is prone  ie  Better not to muddy the waters here.   Do need to sort out exactly what she's taking on return eval but no need to change rx in meantime unless by some accident she is still on ACEi          Each maintenance medication was reviewed in detail including emphasizing most importantly the difference between maintenance and prns and under what circumstances the prns are to be triggered using an action plan format where appropriate.  Total time for H and P, chart review, counseling, reviewing inhaler/neb device(s) and generating customized AVS unique to this office visit / same day charting  > 30 min

## 2020-11-13 NOTE — Progress Notes (Signed)
Kimberly [name], female    DOB: 1965-12-26,    MRN: [id number]   Brief patient profile:  50 yobf active smoker born in [place] first noticed difficulty with sob during junior high but able to do Track  freshman year and changed due to doe to shorter track but  Dx around 2010 while in [place] dx  dm ? When lisinopril added and w/in several years started needing inhalers which never corrected problem and referred to pulmonary clinic in [place]  11/30/2019 by Dr   Ivery Quale      History of Present Illness  11/30/2019  Pulmonary/ 1st office eval/Kimberly [name]  Chief Complaint  Patient presents with   Consult    non productive cough,shortness of breath with activity for years   Dyspnea:  50 ft Cough: variable/ gagging /throat clogged supine  Sleep: occ wakes from sound sleep SABA use: neb at hs / sometimes p exertion, never rechallenges or prechallenges  Rec Stop lisinopril and sart olmesartan  20-12.5 mg one daily in its place  Take omeprazole 40  Take 30-60 min before first meal of the day  Plan B = Backup (to supplement plan A, not to replace it) Only use your albuterol inhaler as a rescue medication Plan C = Crisis (instead of Plan B but only if Plan B stops working) - only use your albuterol nebulizer if you first try [place] to try albuterol 15 min before an activity that you know would make you short of breath  The key is to stop smoking completely before smoking completely stops you! Please schedule a follow up office visit in 6 weeks with pfts > did not do    11/13/2020  f/u ov/[place] office/Kimberly [name] re: ? Acei case in smoker  vs asthma  maint on nothing   Chief Complaint  Patient presents with   Follow-up    Sob on exertion and dry cough.   Dyspnea:  100 ft eg walking to car nl pace  Cough:   2-3 am seems to be the worse but non productive with sensation of globus 3-4 x per week  Sleeping: bed is flat  SABA use: neb at hs  most nights 02: none  Covid status: x 2      No  obvious day to day or daytime variability or assoc excess/ purulent sputum or mucus plugs or hemoptysis or cp or chest tightness, subjective wheeze or overt sinus or hb symptoms.   Also denies any obvious fluctuation of symptoms with weather or environmental changes or other aggravating or alleviating factors except as outlined above   No unusual exposure hx or h/o childhood pna/ asthma or knowledge of premature birth.  Current Allergies, Complete Past Medical History, Past Surgical History, Family History, and Social History were reviewed in [employer] record.  ROS  The following are not active complaints unless bolded Hoarseness, sore throat, dysphagia, dental problems, itching, sneezing,  nasal congestion or discharge of excess mucus or purulent secretions, ear ache,   fever, chills, sweats, unintended wt loss or wt gain, classically pleuritic or exertional cp,  orthopnea pnd or arm/hand swelling  or leg swelling, presyncope, palpitations, abdominal pain, anorexia, nausea, vomiting, diarrhea  or change in bowel habits or change in bladder habits, change in stools or change in urine, dysuria, hematuria,  rash, arthralgias, visual complaints, headache, numbness, weakness or ataxia or problems with walking or coordination,  change in mood or  memory.  Current Meds  Medication Sig   albuterol (PROAIR HFA) 108 (90 Base) MCG/ACT inhaler 2 puffs every 4 hours as needed only  if your can't catch your breath   albuterol (PROVENTIL HFA;VENTOLIN HFA) 108 (90 Base) MCG/ACT inhaler Inhale 1 puff into the lungs every 6 (six) hours as needed for wheezing or shortness of breath.   albuterol (PROVENTIL) (2.5 MG/3ML) 0.083% nebulizer solution Take 3 mLs (2.5 mg total) by nebulization every 6 (six) hours as needed for wheezing or shortness of breath.   amphetamine-dextroamphetamine (ADDERALL) 20 MG tablet Take 20 mg by mouth 2 (two) times daily.   aspirin EC 81 MG tablet Take 81 mg by  mouth daily. Swallow whole.   Blood Glucose Monitoring Suppl (TRUE METRIX METER) w/Device KIT Use as directed   Cholecalciferol (VITAMIN D3) 50 MCG (2000 UT) TABS Take by mouth.   citalopram (CELEXA) 20 MG tablet Take 20 mg by mouth daily.   cloNIDine (CATAPRES) 0.1 MG tablet Take 0.1 mg by mouth 2 (two) times daily.   glucose blood (TRUE METRIX BLOOD GLUCOSE TEST) test strip Use as instructed   HYDROcodone-acetaminophen (NORCO/VICODIN) 5-325 MG tablet Take 1 tablet by mouth every 6 (six) hours as needed for moderate pain. Post op pain   hydrOXYzine (ATARAX/VISTARIL) 25 MG tablet Take 1 tablet (25 mg total) by mouth 3 (three) times daily as needed for anxiety.   insulin aspart (NOVOLOG FLEXPEN) 100 UNIT/ML FlexPen As per sliding scale.   Insulin Disposable Pump (OMNIPOD 5 G6 INTRO, GEN 5,) KIT by Does not apply route.   LORazepam (ATIVAN) 0.5 MG tablet Take 0.5 mg by mouth daily.    losartan (COZAAR) 100 MG tablet Take 100 mg by mouth daily.   Melatonin 3 MG TABS Take 1 tablet (3 mg total) by mouth at bedtime as needed.   metFORMIN (GLUCOPHAGE) 500 MG tablet Take by mouth 2 (two) times daily with a meal.   metoprolol tartrate (LOPRESSOR) 25 MG tablet TAKE 1 TABLET BY MOUTH TWICE A DAY   olmesartan-hydrochlorothiazide (BENICAR HCT) 20-12.5 MG tablet Take 1 tablet by mouth daily.   omeprazole (PRILOSEC) 40 MG capsule Take 40 mg by mouth daily.   pregabalin (LYRICA) 50 MG capsule Take 50 mg by mouth 3 (three) times daily.   rosuvastatin (CRESTOR) 40 MG tablet TAKE 1 TABLET BY MOUTH EVERY DAY   tiZANidine (ZANAFLEX) 4 MG tablet Take 4 mg by mouth 2 (two) times daily.   traZODone (DESYREL) 100 MG tablet Take by mouth.   TRUEPLUS LANCETS 28G MISC Use as directed           Past Medical History:  Diagnosis Date   Asthma    CAD (coronary artery disease) 04/06/2015   CABG with LIMA-LAD   Diabetes mellitus without complication ([place])    Myocardial infarction ([place])    Approx 2014 per pt report,  no further details available      Objective:    Wt Readings from Last 3 Encounters:  11/13/20 186 lb 1.9 oz (84.4 kg)  10/17/20 186 lb (84.4 kg)  09/13/20 174 lb 9.3 oz (79.2 kg)      Vital signs reviewed  11/13/2020  - Note at rest 02 sats  100% on RA   General appearance:    amb bf freq throat clearing      HEENT : pt wearing mask not removed for exam due to covid -19 concerns.    NECK :  without JVD/Nodes/TM/ nl carotid upstrokes bilaterally   LUNGS: no  acc muscle use,  Nl contour chest which is clear to A and P bilaterally without cough on insp or exp maneuvers   CV:  RRR  no s3 or murmur or increase in P2, and no edema   ABD:  soft and nontender with nl inspiratory excursion in the supine position. No bruits or organomegaly appreciated, bowel sounds nl  MS:  Nl gait/ ext warm without deformities, calf tenderness, cyanosis or clubbing No obvious joint restrictions   SKIN: warm and dry without lesions    NEURO:  alert, approp, nl sensorium with  no motor or cerebellar deficits apparent.    CXR PA and Lateral:   11/13/2020 :    I personally reviewed images and impression is as follows:     No acute changes           Assessment

## 2020-11-13 NOTE — Assessment & Plan Note (Addendum)
Active smoker - 0/73/7106 Echo G 1 diastolic dysfunction  - 26/09/4852   Walked on RA  x  3  lap(s) =  approx 41ft @ fast pace, stopped due to end of study but sob p 1st lap  with lowest 02 sats  99%    Symptoms are markedly disproportionate to objective findings and not clear to what extent this is actually a pulmonary  problem but pt does appear to have difficult to sort out respiratory symptoms of unknown origin for which  DDX  = almost all start with A and  include Adherence, Ace Inhibitors, Acid Reflux, Active Sinus Disease, Alpha 1 Antitripsin deficiency, Anxiety masquerading as Airways dz,  ABPA,  Allergy(esp in young), Aspiration (esp in elderly), Adverse effects of meds,  Active smoking or Vaping, A bunch of PE's/clot burden (a few small clots can't cause this syndrome unless there is already severe underlying pulm or vascular dz with poor reserve),  Anemia or thyroid disorder, plus two Bs  = Bronchiectasis and Beta blocker use..and one C= CHF   Adherence is always the initial "prime suspect" and is a multilayered concern that requires a "trust but verify" approach in every patient - starting with knowing how to use medications, especially inhalers, correctly, keeping up with refills and understanding the fundamental difference between maintenance and prns vs those medications only taken for a very short course and then stopped and not refilled.  - pt very confused with names of meds so must return with all meds in hand using a trust but verify approach to confirm accurate Medication  Reconciliation The principal here is that until we are certain that the  patients are doing what we've asked, it makes no sense to ask them to do more.    Cigarette smoking is at the top of the list of usual suspects and need to return for pfts to sort out   Adverse drug effects hard to exclude when not sure what she's been taking > return with all meds to exclude this   ? Anxiety > usually at the bottom of this  list of usual suspects but note already on psychotropics and may interfere with adherence and also interpretation of response or lack thereof to symptom management which can be quite subjective > defer to PCP   ? CHF > does have diastolic dysfunction as above

## 2020-11-13 NOTE — Assessment & Plan Note (Addendum)
Onset around 2012 ? While on acei  - d/c acei 11/30/2019  - 11/13/2020 try max gerd rx and 1st gen H1 blockers per guidelines    Upper airway cough syndrome (previously labeled PNDS),  is so named because it's frequently impossible to sort out how much is  CR/sinusitis with freq throat clearing (which can be related to primary GERD)   vs  causing  secondary (" extra esophageal")  GERD from wide swings in gastric pressure that occur with throat clearing, often  promoting self use of mint and menthol lozenges that reduce the lower esophageal sphincter tone and exacerbate the problem further in a cyclical fashion.   These are the same pts (now being labeled as having "irritable larynx syndrome" by some cough centers) who not infrequently have a history of having failed to tolerate ace inhibitors,  dry powder inhalers or biphosphonates or report having atypical/extraesophageal reflux symptoms that don't respond to standard doses of PPI  and are easily confused as having aecopd or asthma flares by even experienced allergists/ pulmonologists (myself included).   rec Use saba more judiciously: Ok to try albuterol 15 min before an activity (on alternating days)  that you know would usually make you short of breath and see if it makes any difference and if makes none then don't take albuterol after activity unless you can't catch your breath as this means it's the resting that helps, not the albuterol.     Max gerd/ 1st gen H1 blockers per guidelines   Return with pfts as soon as they can be scheduled - may need methacholine test to sort out.

## 2020-11-13 NOTE — Patient Instructions (Addendum)
Ok to try albuterol 15 min before an activity (on alternating days)  that you know would usually make you short of breath and see if it makes any difference and if makes none then don't take albuterol after activity unless you can't catch your breath as this means it's the resting that helps, not the albuterol.   PFTs need to be scheduled next available then I need to see you after that but you must bring all medications and inhalers with you       Continue prilosec (omeprazole) 40 mg Take 30-60 min before first meal of the day and add pepcid ac 20 mg after supper   For drainage / throat tickle try take CHLORPHENIRAMINE  4 mg   should be easiest to find in the green box)  take one every 4 hours as needed - available over the counter- may cause drowsiness so start with just a dose or two an hour before bedtime and see how you tolerate it before trying in daytime    GERD (REFLUX)  is an extremely common cause of respiratory symptoms just like yours , many times with no obvious heartburn at all.    It can be treated with medication, but also with lifestyle changes including elevation of the head of your bed (ideally with 6 -8inch blocks under the headboard of your bed),  Smoking cessation, avoidance of late meals, excessive alcohol, and avoid fatty foods, chocolate, peppermint, colas, red wine, and acidic juices such as orange juice.  NO MINT OR MENTHOL PRODUCTS SO NO COUGH DROPS  USE SUGARLESS CANDY INSTEAD (Jolley ranchers or Stover's or Life Savers) or even ice chips will also do - the key is to swallow to prevent all throat clearing. NO OIL BASED VITAMINS - use powdered substitutes.  Avoid fish oil when coughing.    Please remember to go to the  x-ray department  @  [facility] for your tests - we will call you with the results when they are available

## 2020-11-18 DIAGNOSIS — M25512 Pain in left shoulder: Secondary | ICD-10-CM | POA: Diagnosis not present

## 2020-11-18 DIAGNOSIS — G8929 Other chronic pain: Secondary | ICD-10-CM | POA: Diagnosis not present

## 2020-11-18 DIAGNOSIS — M4722 Other spondylosis with radiculopathy, cervical region: Secondary | ICD-10-CM | POA: Diagnosis not present

## 2020-11-19 DIAGNOSIS — E785 Hyperlipidemia, unspecified: Secondary | ICD-10-CM | POA: Diagnosis not present

## 2020-11-19 DIAGNOSIS — I10 Essential (primary) hypertension: Secondary | ICD-10-CM | POA: Diagnosis not present

## 2020-11-19 DIAGNOSIS — E1143 Type 2 diabetes mellitus with diabetic autonomic (poly)neuropathy: Secondary | ICD-10-CM | POA: Diagnosis not present

## 2020-11-19 DIAGNOSIS — M25512 Pain in left shoulder: Secondary | ICD-10-CM | POA: Diagnosis not present

## 2020-11-19 DIAGNOSIS — J40 Bronchitis, not specified as acute or chronic: Secondary | ICD-10-CM | POA: Diagnosis not present

## 2020-11-23 ENCOUNTER — Other Ambulatory Visit: Payer: Self-pay | Admitting: Family Medicine

## 2020-11-24 NOTE — Progress Notes (Signed)
Cardiology Office Note  Date: 11/25/2020   ID: Kimberly [name], DOB 1965/06/28, MRN [id number]  PCP:  Neale Burly, MD  Cardiologist:  None Electrophysiologist:  None   Chief Complaint: 68-month follow-up  History of Present Illness: Kimberly [name] is a 55 y.o. female with a history of CAD (one-vessel CABG with LIMA to LAD 04/11/2015, HTN, HLD, tobacco abuse, COPD, IDDM, palpitations.  She was last here for 57-month follow-up.  She continued with some DOE/SOB.  History of long-term smoking.  She states she is recently resorted to vaping versus smoking.  She had been having some issues with her diabetes.  Recent hemoglobin A1c per her statement was 10.1%.  History of having issues with her back with sciatica on her right side with some radiation down her right leg.  She also had been experiencing some pain in her right groin at the site where she previously had an emergent surgery status post cardiac catheterization in the past.  She was having some pain in that right leg more so than left especially in the groin area where catheter access site was located..  She had a lower extremity arterial study last yearIn July which showed right ABI within normal range.  Right TBI was normal.  Left ABI was normal, left TBI was normal.  Apparently she did not follow-up with pulmonary referral suggested at last visit.  She denies any anginal symptoms.  She does have chronic dyspnea.  Denies any orthostatic symptoms, CVA or TIA-like symptoms, PND, orthopnea, bleeding.  Denied any claudication-like symptoms, DVT or PE-like symptoms.   She is here for follow-up.  She states he saw Dr. Melvyn Novas recently for her DOE.  He ordered PFTs but she states she has not been called to have PFTs scheduled.  She continues to vape some.  Her arterial study showed no evidence of lower extremity arterial disease.  Her echocardiogram Demonstrated EF 55 to 60%.  Mild LVH, G1 DD normal PASP.  Blood pressures well controlled today 128/74,  heart rate 90.    Past Medical History:  Diagnosis Date   Asthma    CAD (coronary artery disease) 04/06/2015   CABG with LIMA-LAD   Diabetes mellitus without complication ([place])    Myocardial infarction ([place])    Approx 2014 per pt report, no further details available   Ovarian cancer [facility])     Past Surgical History:  Procedure Laterality Date   ABDOMINAL HYSTERECTOMY     ovarian cancer   APPENDECTOMY     CARDIAC CATHETERIZATION N/A 04/08/2015   Procedure: Left Heart Cath and Coronary Angiography;  Surgeon: Jettie Booze, MD; LAD 95%, single vessel disease    CORONARY ARTERY BYPASS GRAFT N/A 04/11/2015   Procedure: CORONARY ARTERY BYPASS GRAFTING (CABG) x one using left internal mamary artery. ;  Surgeon: Ivin Poot, MD;  LIMA-LAD   TEE WITHOUT CARDIOVERSION N/A 04/11/2015   Procedure: TRANSESOPHAGEAL ECHOCARDIOGRAM (TEE);  Surgeon: Ivin Poot, MD;  Location: [place];  Service: Open Heart Surgery;  Laterality: N/A;    Current Outpatient Medications  Medication Sig Dispense Refill   albuterol (PROAIR HFA) 108 (90 Base) MCG/ACT inhaler 2 puffs every 4 hours as needed only  if your can't catch your breath 18 g 1   albuterol (PROVENTIL HFA;VENTOLIN HFA) 108 (90 Base) MCG/ACT inhaler Inhale 1 puff into the lungs every 6 (six) hours as needed for wheezing or shortness of breath. 54 g 3   albuterol (PROVENTIL) (2.5 MG/3ML) 0.083% nebulizer solution Take  3 mLs (2.5 mg total) by nebulization every 6 (six) hours as needed for wheezing or shortness of breath. 75 mL 12   amphetamine-dextroamphetamine (ADDERALL) 20 MG tablet Take 20 mg by mouth 2 (two) times daily.     aspirin EC 81 MG tablet Take 81 mg by mouth daily. Swallow whole.     Blood Glucose Monitoring Suppl (TRUE METRIX METER) w/Device KIT Use as directed 1 kit 0   Cholecalciferol (VITAMIN D3) 50 MCG (2000 UT) TABS Take by mouth.     citalopram (CELEXA) 20 MG tablet Take 20 mg by mouth daily.     cloNIDine (CATAPRES)  0.1 MG tablet Take 0.1 mg by mouth 2 (two) times daily.     glucose blood (TRUE METRIX BLOOD GLUCOSE TEST) test strip Use as instructed 100 each 12   HYDROcodone-acetaminophen (NORCO/VICODIN) 5-325 MG tablet Take 1 tablet by mouth every 6 (six) hours as needed for moderate pain. Post op pain 20 tablet 0   hydrOXYzine (ATARAX/VISTARIL) 25 MG tablet Take 1 tablet (25 mg total) by mouth 3 (three) times daily as needed for anxiety. 30 tablet 0   insulin aspart (NOVOLOG FLEXPEN) 100 UNIT/ML FlexPen As per sliding scale. 15 mL 11   Insulin Disposable Pump (OMNIPOD 5 G6 INTRO, GEN 5,) KIT by Does not apply route.     LORazepam (ATIVAN) 0.5 MG tablet Take 0.5 mg by mouth daily.      losartan (COZAAR) 100 MG tablet Take 100 mg by mouth daily.     Melatonin 3 MG TABS Take 1 tablet (3 mg total) by mouth at bedtime as needed. 30 tablet 0   metFORMIN (GLUCOPHAGE) 500 MG tablet Take by mouth 2 (two) times daily with a meal.     metoprolol tartrate (LOPRESSOR) 25 MG tablet TAKE 1 TABLET BY MOUTH TWICE A DAY 180 tablet 0   olmesartan-hydrochlorothiazide (BENICAR HCT) 20-12.5 MG tablet Take 1 tablet by mouth daily. 30 tablet 11   omeprazole (PRILOSEC) 40 MG capsule Take 40 mg by mouth daily.     pregabalin (LYRICA) 50 MG capsule Take 50 mg by mouth 3 (three) times daily.     rosuvastatin (CRESTOR) 40 MG tablet TAKE 1 TABLET BY MOUTH EVERY DAY 90 tablet 2   tiZANidine (ZANAFLEX) 4 MG tablet Take 4 mg by mouth 2 (two) times daily.     traZODone (DESYREL) 100 MG tablet Take by mouth.     TRUEPLUS LANCETS 28G MISC Use as directed 100 each 12   No current facility-administered medications for this visit.   Allergies:  Atomoxetine, Oxycodone, Oxycodone-acetaminophen, Phenazopyridine, Strattera [atomoxetine hcl], and Pyridium [phenazopyridine hcl]   Social History: The patient  reports that she has been smoking cigarettes. She started smoking about 41 years ago. She has a 21.00 pack-year smoking history. She has  never used smokeless tobacco. She reports current alcohol use of about 3.0 standard drinks per week. She reports that she does not use drugs.   Family History: The patient's family history includes Diabetes in her mother; Heart disease in her maternal grandfather.   ROS:  Please see the history of present illness. Otherwise, complete review of systems is positive for none.  All other systems are reviewed and negative.   Physical Exam: VS:  BP 128/74   Pulse 90   Ht 5' 6.5" (1.689 m)   Wt 187 lb (84.8 kg)   SpO2 96%   BMI 29.73 kg/m , BMI Body mass index is 29.73 kg/m.   Wt Readings  from Last 3 Encounters:  11/25/20 187 lb (84.8 kg)  11/13/20 186 lb 1.9 oz (84.4 kg)  10/17/20 186 lb (84.4 kg)    General: Patient appears comfortable at rest. Neck: Supple, no elevated JVP or carotid bruits, no thyromegaly. Lungs: Clear to auscultation, nonlabored breathing at rest. Cardiac: Regular rate and rhythm, no S3 or significant systolic murmur, no pericardial rub. Extremities: No pitting edema, distal pulses 2+. Skin: Warm and dry. Musculoskeletal: No kyphosis. Neuropsychiatric: Alert and oriented x3, affect grossly appropriate.  ECG: 09/13/2020 EKG sinus bradycardia with a rate of 54.  Recent Labwork: No results found for requested labs within last 8760 hours.     Component Value Date/Time   CHOL 183 12/14/2018 1146   TRIG 171 (H) 12/14/2018 1146   HDL 49 12/14/2018 1146   CHOLHDL 3.7 12/14/2018 1146   CHOLHDL 4.3 04/06/2015 1816   VLDL 53 (H) 04/06/2015 1816   LDLCALC 104 (H) 12/14/2018 1146    Other Studies Reviewed Today:  US Arterial ABI 10/03/2020 CLINICAL DATA:  Right lower leg claudication and rest pain   EXAM: NONINVASIVE PHYSIOLOGIC VASCULAR STUDY OF BILATERAL LOWER EXTREMITIES   TECHNIQUE: Evaluation of both lower extremities were performed at rest, including calculation of ankle-brachial indices with single level Doppler, pressure and pulse volume recording.    COMPARISON:  None.   FINDINGS: Right ABI:  1.09   Left ABI:  1.19   Right Lower Extremity:  Normal arterial waveforms at the ankle.   Left Lower Extremity: Multiphasic waveform noted in the posterior tibial artery. Dorsalis pedis artery is monophasic.   1.0-1.4 Normal   IMPRESSION: No definitive evidence of significant lower extremity arterial occlusive disease.    Echocardiogram 10/03/2020   1. Left ventricular ejection fraction, by estimation, is 55 to 60%. The  left ventricle has normal function. The left ventricle has no regional  wall motion abnormalities. There is mild left ventricular hypertrophy.  Left ventricular diastolic parameters  are consistent with Grade I diastolic dysfunction (impaired relaxation).   2. Right ventricular systolic function is normal. The right ventricular  size is normal. There is normal pulmonary artery systolic pressure.   3. The mitral valve is normal in structure. No evidence of mitral valve  regurgitation. No evidence of mitral stenosis.   4. The tricuspid valve is abnormal.   5. The aortic valve is tricuspid. Aortic valve regurgitation is not  visualized. No aortic stenosis is present.   6. The inferior vena cava is normal in size with greater than 50%  respiratory variability, suggesting right atrial pressure of 3 mmHg.   Lower arterial duplex 08/10/2019 Summary:  Right: Resting right ankle-brachial index is within normal range. No  evidence of significant right lower extremity arterial disease. The right  toe-brachial index is normal.   Left: Resting left ankle-brachial index is within normal range. No  evidence of significant left lower extremity arterial disease. The left  toe-brachial index is normal.   Cardiac catheterization 11/18/2017 conclusions: Obstructive native one-vessel coronary artery disease. Status post CABG with one-to-one grafts patent. Preserved left ventricular systolic function with regional wall motion as  above.  Angio-Seal closure of right common femoral artery.    Echo: 04/08/2015 - Left ventricle: Distal septal hypokinesis The cavity size was   mildly dilated. Wall thickness was normal. Systolic function was   normal. The estimated ejection fraction was in the range of 50%   to 55%. Doppler parameters are consistent with elevated   ventricular end-diastolic filling pressure. - Aortic  valve: There was mild regurgitation. - Atrial septum: No defect or patent foramen ovale was identified.   CATH: 04/08/2015 Ost LAD to Prox LAD lesion, 95% stenosed. Severe tortuosity in the right subclavian made torquing catheters quite difficult. Would not use the right radial in the future for access site for cardiac cath.  Severe ostial LAD stenosis. There is no landing zone for stent. Fixing this percutaneously would require leaving stents in the distal left main and putting a large ramus and circumflex at risk to be jailed. She would also likely have to be on lifelong clopidogrel. I think a better option would be to have a surgical consult for possible LIMA to LAD. She has a good target vessel in the mid to distal LAD.   Assessment and Plan:   1. CAD in native artery At last visit she was complaining of intermittent pain under her left breast aggravated by deep breaths.  Continues to complain of occasional intermittent pain under her left breast.  No classic anginal symptoms.  Denies any radiation or associated symptoms.  States this can occur at rest and not necessarily with activity.  Continue aspirin 81 mg.    2. Essential hypertension Blood pressure of BP today 128/74.  Continue losartan 50 mg daily.  Continue clonidine 0.1 mg p.o. twice daily.  Provider  3. Hyperlipidemia LDL goal <70 Continue Crestor 20 mg daily.  Last LDL 7 months ago 104.  Goal is less than 70.     4. Tobacco use Patient states she has cut way back on cigarettes.  She is occasionally vaping.  Advised cessation if  possible.  5. Palpitations At last visit she complained of occasional palpitations but not bothersome.  No complaints of palpitations today.   6. Epigastric pain Patient describes epigastric pain/left below the rib cage and axillary region.  Not associated with activity.  No aggravating or alleviating factors.  No associated nausea, vomiting, or diaphoresis.  No radiation.  States she was told before she has gastric reflux symptoms and needed to see a GI specialist.  She was referred to [facility] gastroenterology at prior visit visit.  She takes PPI omeprazole 40 mg daily.  7.  Leg pain  Recent lower arterial study with ABIs showed normal ABIs on left and right with normal TBI's on the left and right.  No evidence of lower extremity arterial disease.  Patient states she had a previous cardiac catheterization in 2019 with access through her right groin.  She stated she had emergency surgery on the right leg after the cardiac catheterization secondary to reduced blood flow.  Apparently she had an angio seal placed which may have migrated ultimately requiring removal.  She states that leg has not been right since then.  Denies any leg pain today.    8.  History of smoking with associated history of dyspnea Long history of smoking with associated dyspnea.  States she has significantly cut down on smoking but has not stopped yet.  She has been vaping some.     9.  Shortness of breath She states she has been having some increasing shortness of breath recently.   Recently saw Dr. Melvyn Novas pulmonology.  Plan was for PFTs.  She states no one has contacted her regarding scheduling PFTs.  Recent follow-up echocardiogram with EF 55 to 60%.  Mild LVH, G1 DD.  Medication Adjustments/Labs and Tests Ordered: Current medicines are reviewed at length with the patient today.  Concerns regarding medicines are outlined above.   Disposition:  Follow-up with Dr. Harl Bowie or APP 6 months.   Signed, Levell July,  NP 11/25/2020 2:48 PM    [facility] at [address], [place], [place] [address] Phone: [phone]; Fax: [phone]

## 2020-11-25 ENCOUNTER — Telehealth: Payer: Self-pay

## 2020-11-25 ENCOUNTER — Ambulatory Visit (INDEPENDENT_AMBULATORY_CARE_PROVIDER_SITE_OTHER): Payer: Medicare Other | Admitting: Family Medicine

## 2020-11-25 ENCOUNTER — Other Ambulatory Visit: Payer: Self-pay

## 2020-11-25 ENCOUNTER — Encounter: Payer: Self-pay | Admitting: Family Medicine

## 2020-11-25 VITALS — BP 128/74 | HR 90 | Ht 66.5 in | Wt 187.0 lb

## 2020-11-25 DIAGNOSIS — M79605 Pain in left leg: Secondary | ICD-10-CM

## 2020-11-25 DIAGNOSIS — Z720 Tobacco use: Secondary | ICD-10-CM

## 2020-11-25 DIAGNOSIS — R0602 Shortness of breath: Secondary | ICD-10-CM

## 2020-11-25 DIAGNOSIS — M79604 Pain in right leg: Secondary | ICD-10-CM | POA: Diagnosis not present

## 2020-11-25 DIAGNOSIS — R002 Palpitations: Secondary | ICD-10-CM | POA: Diagnosis not present

## 2020-11-25 DIAGNOSIS — R1013 Epigastric pain: Secondary | ICD-10-CM

## 2020-11-25 DIAGNOSIS — F17210 Nicotine dependence, cigarettes, uncomplicated: Secondary | ICD-10-CM | POA: Diagnosis not present

## 2020-11-25 DIAGNOSIS — E782 Mixed hyperlipidemia: Secondary | ICD-10-CM | POA: Diagnosis not present

## 2020-11-25 DIAGNOSIS — I10 Essential (primary) hypertension: Secondary | ICD-10-CM | POA: Diagnosis not present

## 2020-11-25 DIAGNOSIS — I2510 Atherosclerotic heart disease of native coronary artery without angina pectoris: Secondary | ICD-10-CM | POA: Diagnosis not present

## 2020-11-25 NOTE — Telephone Encounter (Signed)
Was called by Mel Almond in [place] office.  Patient was seen by Dr. Leonides Sake at [facility] cardiology today and was asked about PFT. States she has never been called to schedule. Called [facility] [phone] and LVM with patients MRN and let them know she needs to be scheduled. Order was placed in 11/2019. Did offer to schedule the pt in [place] office and she refused since [place] is too far from [place]. Advised patient to call our office by Wednesday if she has not heard from [facility] in regards to scheduling.

## 2020-11-25 NOTE — Patient Instructions (Addendum)
Medication Instructions:  Your physician recommends that you continue on your current medications as directed. Please refer to the Current Medication list given to you today.   Labwork: None   Testing/Procedures: None   Follow-Up: 6 months with MD  Any Other Special Instructions Will Be Listed Below (If Applicable).  If you need a refill on your cardiac medications before your next appointment, please call your pharmacy.   I spoke with Dr.Wert's office, [phone], they will call you to schedule pulmonary function test

## 2020-11-26 ENCOUNTER — Other Ambulatory Visit (HOSPITAL_COMMUNITY): Payer: Self-pay | Admitting: Radiology

## 2020-11-26 DIAGNOSIS — R0600 Dyspnea, unspecified: Secondary | ICD-10-CM

## 2020-12-10 ENCOUNTER — Ambulatory Visit: Payer: Medicare Other | Admitting: Family Medicine

## 2020-12-27 ENCOUNTER — Other Ambulatory Visit (HOSPITAL_COMMUNITY)
Admission: RE | Admit: 2020-12-27 | Discharge: 2020-12-27 | Disposition: A | Discharge status: Home / Self Care | Payer: Medicare Other | Source: Ambulatory Visit | Attending: Internal Medicine | Admitting: Internal Medicine

## 2020-12-27 DIAGNOSIS — Z01818 Encounter for other preprocedural examination: Secondary | ICD-10-CM

## 2020-12-31 ENCOUNTER — Ambulatory Visit (HOSPITAL_COMMUNITY): Payer: Medicare Other

## 2021-01-02 DIAGNOSIS — E1143 Type 2 diabetes mellitus with diabetic autonomic (poly)neuropathy: Secondary | ICD-10-CM | POA: Diagnosis not present

## 2021-01-02 DIAGNOSIS — E785 Hyperlipidemia, unspecified: Secondary | ICD-10-CM | POA: Diagnosis not present

## 2021-01-02 DIAGNOSIS — I10 Essential (primary) hypertension: Secondary | ICD-10-CM | POA: Diagnosis not present

## 2021-02-13 ENCOUNTER — Encounter (INDEPENDENT_AMBULATORY_CARE_PROVIDER_SITE_OTHER): Payer: Self-pay | Admitting: *Deleted

## 2021-02-14 ENCOUNTER — Other Ambulatory Visit (HOSPITAL_COMMUNITY)
Admission: RE | Admit: 2021-02-14 | Discharge: 2021-02-14 | Disposition: A | Discharge status: Home / Self Care | Payer: Medicare Other | Source: Ambulatory Visit | Attending: Internal Medicine | Admitting: Internal Medicine

## 2021-02-14 DIAGNOSIS — Z01818 Encounter for other preprocedural examination: Secondary | ICD-10-CM

## 2021-02-18 ENCOUNTER — Ambulatory Visit (HOSPITAL_COMMUNITY): Payer: Medicare Other

## 2021-02-24 DIAGNOSIS — Z79899 Other long term (current) drug therapy: Secondary | ICD-10-CM | POA: Diagnosis not present

## 2021-02-24 DIAGNOSIS — Z5181 Encounter for therapeutic drug level monitoring: Secondary | ICD-10-CM | POA: Diagnosis not present

## 2021-03-11 DIAGNOSIS — M25512 Pain in left shoulder: Secondary | ICD-10-CM | POA: Diagnosis not present

## 2021-03-13 DIAGNOSIS — M25512 Pain in left shoulder: Secondary | ICD-10-CM | POA: Diagnosis not present

## 2021-03-28 ENCOUNTER — Ambulatory Visit (INDEPENDENT_AMBULATORY_CARE_PROVIDER_SITE_OTHER): Payer: Medicare Other | Admitting: Podiatry

## 2021-03-28 ENCOUNTER — Other Ambulatory Visit: Payer: Self-pay

## 2021-03-28 DIAGNOSIS — B351 Tinea unguium: Secondary | ICD-10-CM | POA: Diagnosis not present

## 2021-03-28 DIAGNOSIS — E119 Type 2 diabetes mellitus without complications: Secondary | ICD-10-CM | POA: Diagnosis not present

## 2021-04-02 NOTE — Progress Notes (Signed)
Subjective: ?Kimberly [name] presents today referred by Neale Burly, MD for diabetic foot evaluation. ? ?Patient relates 5 year history of diabetes. ? ?Patient denies any history of foot wounds. ? ?Patient denies any history of numbness, tingling, burning, pins/needles sensations. ? ?Past Medical History:  ?Diagnosis Date  ? Asthma   ? CAD (coronary artery disease) 04/06/2015  ? CABG with LIMA-LAD  ? Diabetes mellitus without complication ([place])   ? Myocardial infarction [facility])   ? Approx 2014 per pt report, no further details available  ? Ovarian cancer ([place])   ? ? ?Patient Active Problem List  ? Diagnosis Date Noted  ? DOE (dyspnea on exertion) 11/13/2020  ? Encounter for screening fecal occult blood testing 10/17/2020  ? Visit for pelvic exam 10/17/2020  ? History of ovarian cancer 10/17/2020  ? S/P hysterectomy with oophorectomy 10/17/2020  ? Essential hypertension 11/30/2019  ? Impingement syndrome of right shoulder 08/10/2019  ? Trigger finger, right middle finger 12/28/2018  ? Hyperlipidemia associated with type 2 diabetes mellitus ([place]) 05/06/2015  ? Adjustment reaction with mixed emotional features 04/22/2015  ? S/P CABG x 1 04/11/2015  ? Unstable angina ([facility])   ? Hypertensive heart disease without heart failure   ? Diabetes mellitus, type 2 ([place]) 04/06/2015  ? Hyperlipidemia 04/06/2015  ? ADHD (attention deficit hyperactivity disorder) 04/06/2015  ? Cigarette smoker 04/06/2015  ? Cough variant asthma vs uacs  04/06/2015  ? ? ?Past Surgical History:  ?Procedure Laterality Date  ? ABDOMINAL HYSTERECTOMY    ? ovarian cancer  ? APPENDECTOMY    ? CARDIAC CATHETERIZATION N/A 04/08/2015  ? Procedure: Left Heart Cath and Coronary Angiography;  Surgeon: Jettie Booze, MD; LAD 95%, single vessel disease   ? CORONARY ARTERY BYPASS GRAFT N/A 04/11/2015  ? Procedure: CORONARY ARTERY BYPASS GRAFTING (CABG) x one using left internal mamary artery. ;  Surgeon: Ivin Poot, MD;  LIMA-LAD  ? TEE WITHOUT  CARDIOVERSION N/A 04/11/2015  ? Procedure: TRANSESOPHAGEAL ECHOCARDIOGRAM (TEE);  Surgeon: Ivin Poot, MD;  Location: [place];  Service: Open Heart Surgery;  Laterality: N/A;  ? ? ?Current Outpatient Medications on File Prior to Visit  ?Medication Sig Dispense Refill  ? albuterol (PROAIR HFA) 108 (90 Base) MCG/ACT inhaler 2 puffs every 4 hours as needed only  if your can't catch your breath 18 g 1  ? albuterol (PROVENTIL HFA;VENTOLIN HFA) 108 (90 Base) MCG/ACT inhaler Inhale 1 puff into the lungs every 6 (six) hours as needed for wheezing or shortness of breath. 54 g 3  ? albuterol (PROVENTIL) (2.5 MG/3ML) 0.083% nebulizer solution Take 3 mLs (2.5 mg total) by nebulization every 6 (six) hours as needed for wheezing or shortness of breath. 75 mL 12  ? amphetamine-dextroamphetamine (ADDERALL) 20 MG tablet Take 20 mg by mouth 2 (two) times daily.    ? aspirin EC 81 MG tablet Take 81 mg by mouth daily. Swallow whole.    ? Blood Glucose Monitoring Suppl (TRUE METRIX METER) w/Device KIT Use as directed 1 kit 0  ? Cholecalciferol (VITAMIN D3) 50 MCG (2000 UT) TABS Take by mouth.    ? citalopram (CELEXA) 20 MG tablet Take 20 mg by mouth daily.    ? cloNIDine (CATAPRES) 0.1 MG tablet Take 0.1 mg by mouth 2 (two) times daily.    ? glucose blood (TRUE METRIX BLOOD GLUCOSE TEST) test strip Use as instructed 100 each 12  ? HYDROcodone-acetaminophen (NORCO/VICODIN) 5-325 MG tablet Take 1 tablet by mouth every 6 (six) hours  as needed for moderate pain. Post op pain 20 tablet 0  ? hydrOXYzine (ATARAX/VISTARIL) 25 MG tablet Take 1 tablet (25 mg total) by mouth 3 (three) times daily as needed for anxiety. 30 tablet 0  ? insulin aspart (NOVOLOG FLEXPEN) 100 UNIT/ML FlexPen As per sliding scale. 15 mL 11  ? Insulin Disposable Pump (OMNIPOD 5 G6 INTRO, GEN 5,) KIT by Does not apply route.    ? LORazepam (ATIVAN) 0.5 MG tablet Take 0.5 mg by mouth daily.     ? losartan (COZAAR) 100 MG tablet Take 100 mg by mouth daily.    ? Melatonin  3 MG TABS Take 1 tablet (3 mg total) by mouth at bedtime as needed. 30 tablet 0  ? metFORMIN (GLUCOPHAGE) 500 MG tablet Take by mouth 2 (two) times daily with a meal.    ? metoprolol tartrate (LOPRESSOR) 25 MG tablet TAKE 1 TABLET BY MOUTH TWICE A DAY 180 tablet 0  ? omeprazole (PRILOSEC) 40 MG capsule Take 40 mg by mouth daily.    ? pregabalin (LYRICA) 50 MG capsule Take 50 mg by mouth 3 (three) times daily.    ? rosuvastatin (CRESTOR) 40 MG tablet TAKE 1 TABLET BY MOUTH EVERY DAY 90 tablet 2  ? tiZANidine (ZANAFLEX) 4 MG tablet Take 4 mg by mouth 2 (two) times daily.    ? traZODone (DESYREL) 100 MG tablet Take by mouth.    ? TRUEPLUS LANCETS 28G MISC Use as directed 100 each 12  ? ?No current facility-administered medications on file prior to visit.  ?  ? ?Allergies  ?Allergen Reactions  ? Atomoxetine   ?  Other reaction(s): Other (See Comments) ?Caused yeast infection per [facility] records ?  ? Oxycodone   ? Oxycodone-Acetaminophen   ? Phenazopyridine   ? Strattera [Atomoxetine Hcl] Other (See Comments)  ?  Caused yeast infection per [facility] records  ? Pyridium [Phenazopyridine Hcl] Itching  ? ? ?Social History  ? ?Occupational History  ? Not on file  ?Tobacco Use  ? Smoking status: Some Days  ?  Packs/day: 0.50  ?  Years: 42.00  ?  Pack years: 21.00  ?  Types: Cigarettes  ?  Start date: 09/14/1979  ? Smokeless tobacco: Never  ? Tobacco comments:  ?  smokes 6 cigarettes per day --11/30/2019  ?Vaping Use  ? Vaping Use: Former  ? Start date: 09/13/2019  ? Quit date: 10/02/2020  ?Substance and Sexual Activity  ? Alcohol use: Yes  ?  Alcohol/week: 3.0 standard drinks  ?  Types: 3 Glasses of wine per week  ?  Comment: occasional  ? Drug use: No  ? Sexual activity: Yes  ?  Birth control/protection: None, Surgical  ?  Comment: hysterectomy  ? ? ?Family History  ?Problem Relation Age of Onset  ? Heart disease Maternal Grandfather   ? Diabetes Mother   ? ? ?Immunization History  ?Administered Date(s) Administered  ?  Pneumococcal Polysaccharide-23 04/26/2017, 12/14/2018  ? ? ?Review of systems: Positive Findings in bold print. ? ?Constitutional:  chills, fatigue, fever, sweats, weight change ?Communication: Optometrist, sign Ecologist, hand writing, iPad/Android device ?Head: headaches, head injury ?Eyes: changes in vision, eye pain, glaucoma, cataracts, macular degeneration, diplopia, glare,  light sensitivity, eyeglasses or contacts, blindness ?Ears nose mouth throat: hearing impaired, hearing aids,  ringing in ears, deaf, sign language,  vertigo, nosebleeds,  rhinitis,  cold sores, snoring, swollen glands ?Cardiovascular: HTN, edema, arrhythmia, pacemaker in place, defibrillator in place, chest pain/tightness, chronic anticoagulation, blood  clot, heart failure, MI ?Peripheral Vascular: leg cramps, varicose veins, blood clots, lymphedema, varicosities ?Respiratory:  asthma, difficulty breathing, denies congestion, SOB, wheezing, cough, emphysema ?Gastrointestinal: change in appetite or weight, abdominal pain, constipation, diarrhea, nausea, vomiting, vomiting blood, change in bowel habits, abdominal pain, jaundice, rectal bleeding, hemorrhoids, GERD ?Genitourinary:  nocturia,  pain on urination, polyuria,  blood in urine, Foley catheter, urinary urgency, ESRD on hemodialysis ?Musculoskeletal: amputation, cramping, stiff joints, painful joints, decreased joint motion, fractures, OA, gout, hemiplegia, paraplegia, uses cane, wheelchair bound, uses walker, uses rollator ?Skin: +changes in toenails, color change, dryness, itching, mole changes,  rash, wound(s) ?Neurological: headaches, numbness in feet, paresthesias in feet, burning in feet, fainting,  seizures, change in speech, migraines, memory problems/poor historian, cerebral palsy, weakness, paralysis, CVA, TIA ?Endocrine: diabetes, hypothyroidism, hyperthyroidism,  goiter, dry mouth, flushing, heat intolerance, cold intolerance,  excessive thirst, denies polyuria,   nocturia ?Hematological:  easy bleeding, excessive bleeding, easy bruising, enlarged lymph nodes, on long term blood thinner, history of past transusions ?Allergy/immunological:  hives, eczema, frequent infections, mu

## 2021-04-09 DIAGNOSIS — E785 Hyperlipidemia, unspecified: Secondary | ICD-10-CM | POA: Diagnosis not present

## 2021-04-09 DIAGNOSIS — I10 Essential (primary) hypertension: Secondary | ICD-10-CM | POA: Diagnosis not present

## 2021-04-09 DIAGNOSIS — E1143 Type 2 diabetes mellitus with diabetic autonomic (poly)neuropathy: Secondary | ICD-10-CM | POA: Diagnosis not present

## 2021-04-10 ENCOUNTER — Telehealth: Payer: Self-pay | Admitting: Podiatry

## 2021-04-10 NOTE — Telephone Encounter (Signed)
Pt called to see if an order was in for labwork to have the liver function test done. She states she is unsure as to where she needed to go to have this done. ? ?Please advise. ?

## 2021-04-15 ENCOUNTER — Other Ambulatory Visit: Payer: Self-pay

## 2021-04-15 DIAGNOSIS — Z79899 Other long term (current) drug therapy: Secondary | ICD-10-CM

## 2021-04-17 DIAGNOSIS — Z5181 Encounter for therapeutic drug level monitoring: Secondary | ICD-10-CM | POA: Diagnosis not present

## 2021-04-17 DIAGNOSIS — Z79899 Other long term (current) drug therapy: Secondary | ICD-10-CM | POA: Diagnosis not present

## 2021-04-25 ENCOUNTER — Other Ambulatory Visit (HOSPITAL_COMMUNITY): Payer: Medicare Other

## 2021-04-29 ENCOUNTER — Ambulatory Visit (HOSPITAL_COMMUNITY): Payer: Medicare Other | Attending: Internal Medicine

## 2021-05-07 ENCOUNTER — Ambulatory Visit (HOSPITAL_COMMUNITY)
Admission: RE | Admit: 2021-05-07 | Discharge: 2021-05-07 | Disposition: A | Discharge status: Home / Self Care | Payer: Medicare Other | Source: Ambulatory Visit | Attending: Internal Medicine | Admitting: Internal Medicine

## 2021-05-07 DIAGNOSIS — R0600 Dyspnea, unspecified: Secondary | ICD-10-CM | POA: Diagnosis not present

## 2021-05-07 LAB — PULMONARY FUNCTION TEST
DL/VA % pred: 88 %
DL/VA: 3.7 ml/min/mmHg/L
DLCO unc % pred: 60 %
DLCO unc: 13.3 ml/min/mmHg
FEF 25-75 Post: 2.3 L/sec
FEF 25-75 Pre: 2.35 L/sec
FEF2575-%Change-Post: -2 %
FEF2575-%Pred-Post: 98 %
FEF2575-%Pred-Pre: 100 %
FEV1-%Change-Post: 0 %
FEV1-%Pred-Post: 91 %
FEV1-%Pred-Pre: 90 %
FEV1-Post: 2.13 L
FEV1-Pre: 2.12 L
FEV1FVC-%Change-Post: -9 %
FEV1FVC-%Pred-Pre: 106 %
FEV6-%Change-Post: 9 %
FEV6-%Pred-Post: 94 %
FEV6-%Pred-Pre: 86 %
FEV6-Post: 2.72 L
FEV6-Pre: 2.48 L
FEV6FVC-%Pred-Post: 103 %
FEV6FVC-%Pred-Pre: 103 %
FVC-%Change-Post: 11 %
FVC-%Pred-Post: 93 %
FVC-%Pred-Pre: 83 %
FVC-Post: 2.77 L
FVC-Pre: 2.48 L
Post FEV1/FVC ratio: 77 %
Post FEV6/FVC ratio: 100 %
Pre FEV1/FVC ratio: 85 %
Pre FEV6/FVC Ratio: 100 %
RV % pred: 63 %
RV: 1.29 L
TLC % pred: 68 %
TLC: 3.69 L

## 2021-05-07 MED ORDER — ALBUTEROL SULFATE (2.5 MG/3ML) 0.083% IN NEBU
2.5000 mg | INHALATION_SOLUTION | Freq: Once | RESPIRATORY_TRACT | Status: AC
  Administered 2021-05-07: 2.5 mg via RESPIRATORY_TRACT

## 2021-05-09 ENCOUNTER — Telehealth: Payer: Self-pay | Admitting: Internal Medicine

## 2021-05-09 NOTE — Telephone Encounter (Signed)
Called and made pt aware that results were not ready yet but would call and notify patient once they were in. She voiced understanding. Nothing further needed  ?

## 2021-05-12 ENCOUNTER — Other Ambulatory Visit: Payer: Self-pay

## 2021-05-12 DIAGNOSIS — R0600 Dyspnea, unspecified: Secondary | ICD-10-CM

## 2021-05-13 DIAGNOSIS — M25512 Pain in left shoulder: Secondary | ICD-10-CM | POA: Diagnosis not present

## 2021-05-13 DIAGNOSIS — G8929 Other chronic pain: Secondary | ICD-10-CM | POA: Diagnosis not present

## 2021-05-20 IMAGING — CT CT ANGIO CHEST
2 of 6 series · 18 of 46 positions shown · IV contrast (omnipaque)
Comparison: Chest radiographs 09/08/2019

CLINICAL DATA: Shortness of breath and left anterior chest pain.
Right leg swelling.

EXAM:
CT ANGIOGRAPHY CHEST WITH CONTRAST
TECHNIQUE: Multidetector CT imaging of the chest was performed using the
standard protocol during bolus administration of intravenous
contrast. Multiplanar CT image reconstructions and MIPs were
obtained to evaluate the vascular anatomy.
CONTRAST:  100mL OMNIPAQUE IOHEXOL 350 MG/ML SOLN

[Series 5: pe axial thins · axial · 0.62mm/px · z∈[+1367,+1598]mm · 15 of 255 slices shown]
[im 12/255  lung]
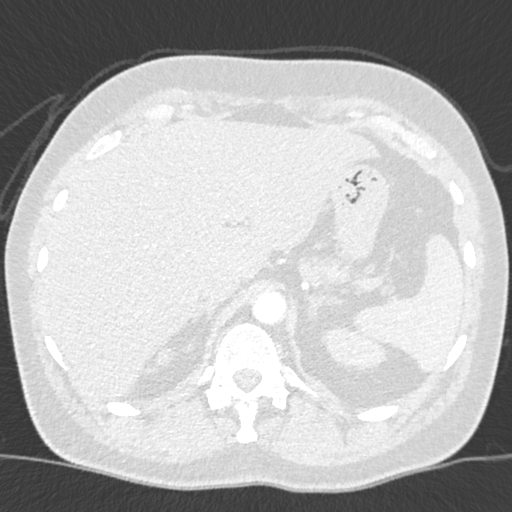
[im 34/255  soft-tissue]
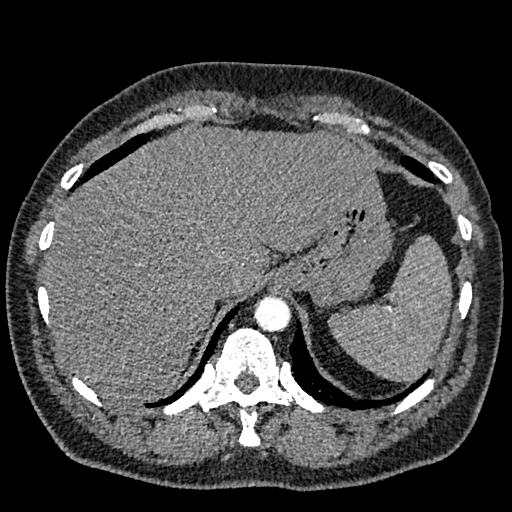
[im 45/255  lung]
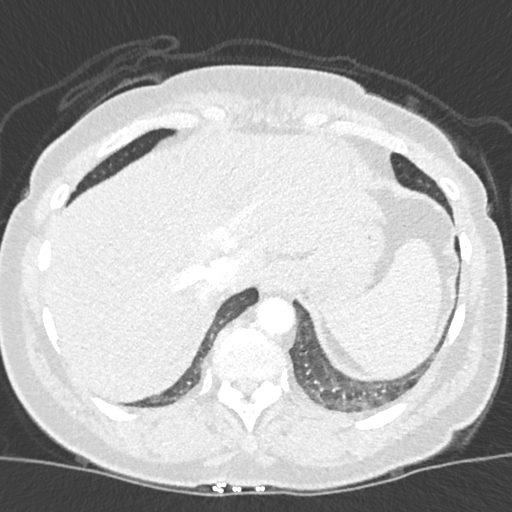
[im 67/255  soft-tissue]
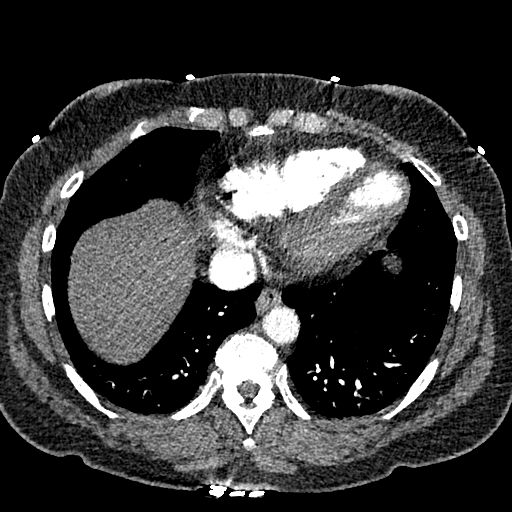
[im 78/255  lung]
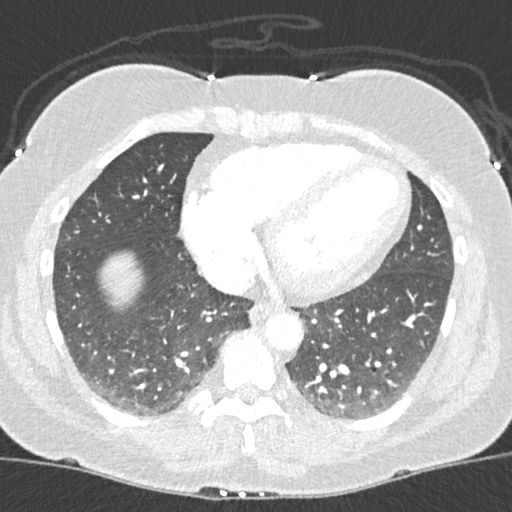
[im 100/255  soft-tissue]
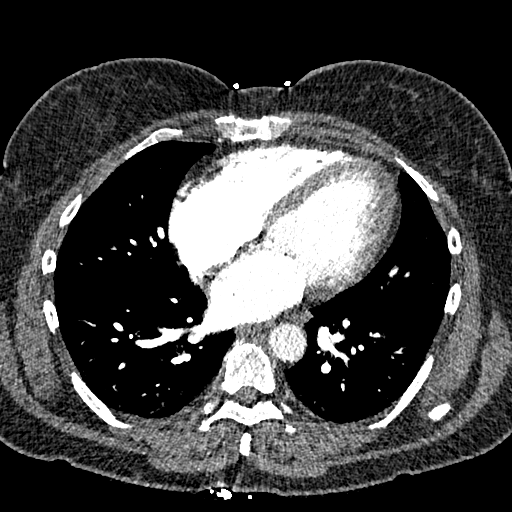
[im 111/255  lung]
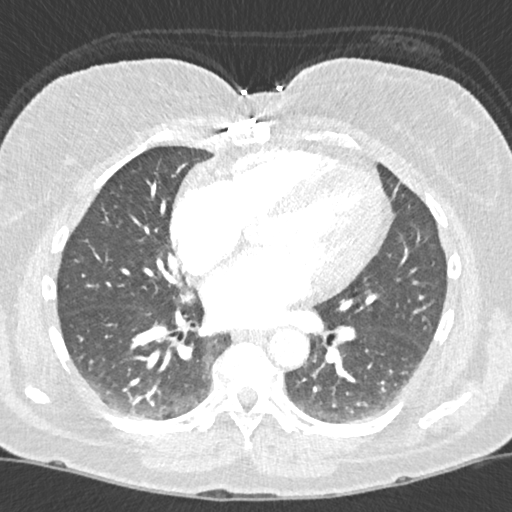
[im 133/255  soft-tissue]
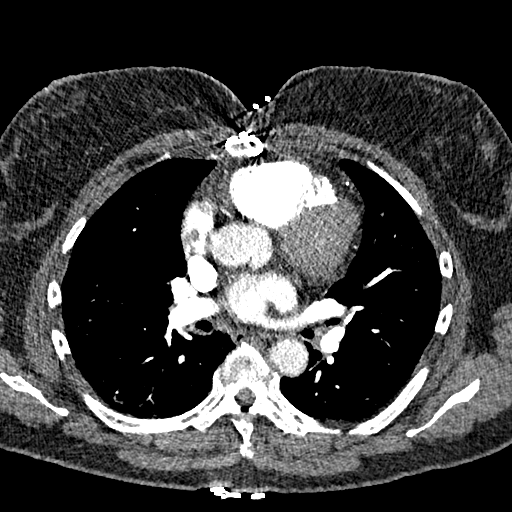
[im 144/255  lung]
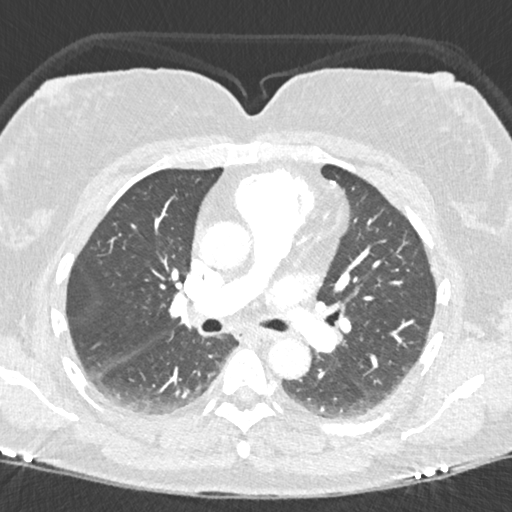
[im 155/255  soft-tissue]
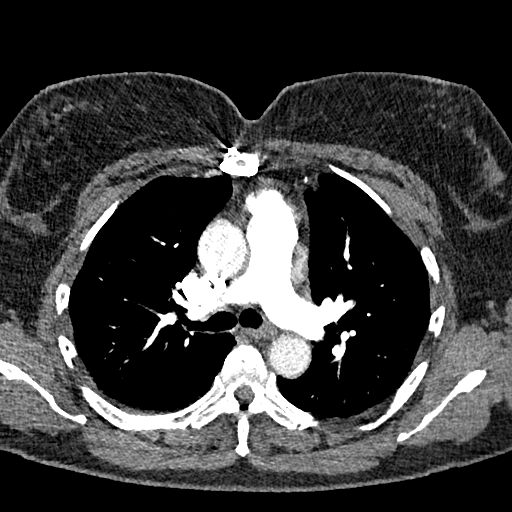
[im 177/255  lung]
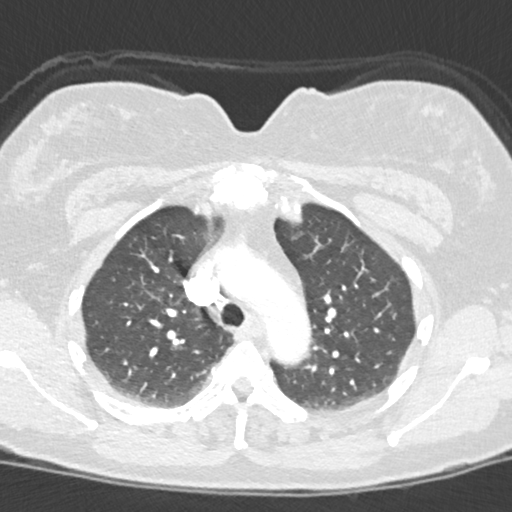
[im 188/255  soft-tissue]
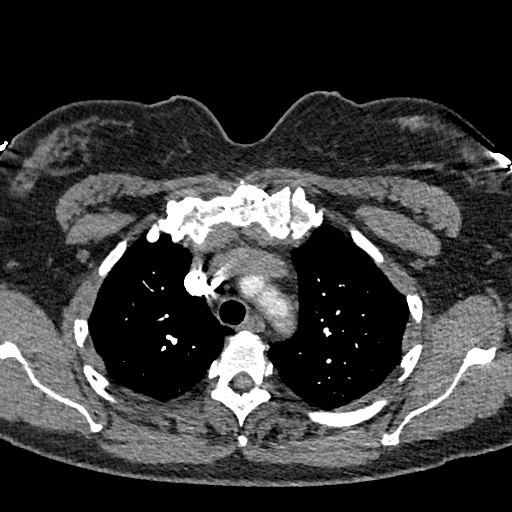
[im 210/255  lung]
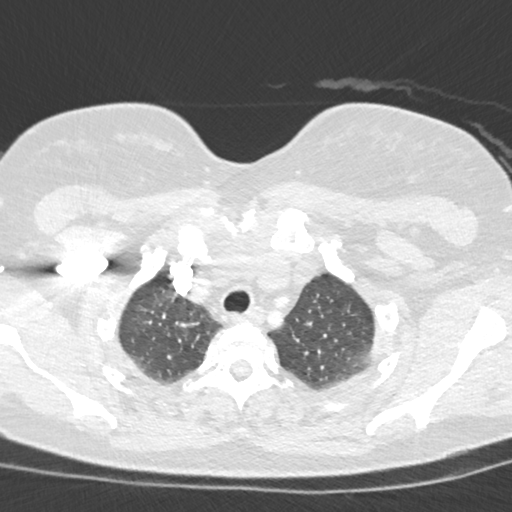
[im 221/255  soft-tissue]
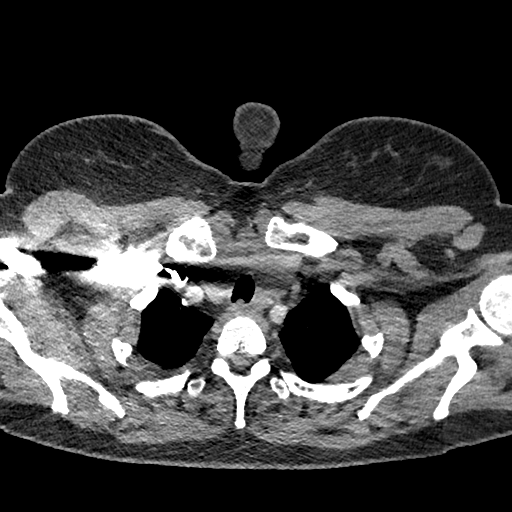
[im 243/255  lung]
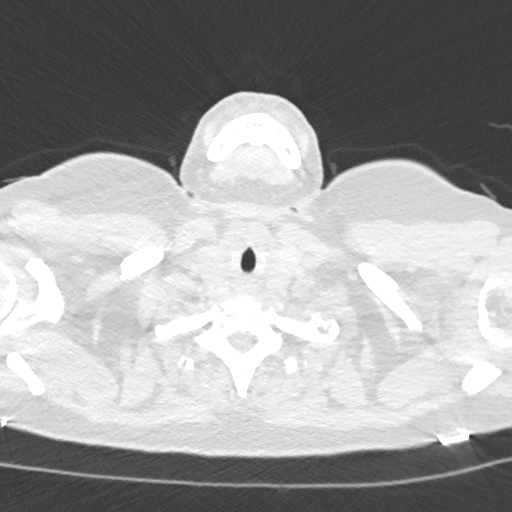

[Series 7: cor soft · coronal · 0.50mm/px · 3 of 151 slices shown]
[im 38/151  soft-tissue]
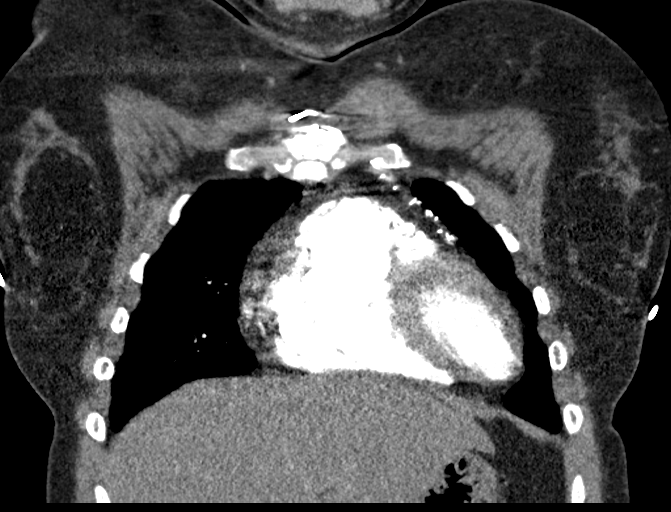
[im 76/151  soft-tissue]
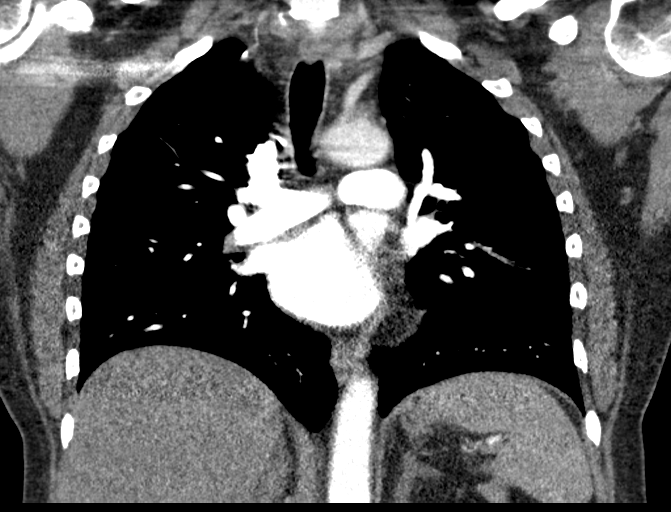
[im 113/151  soft-tissue]
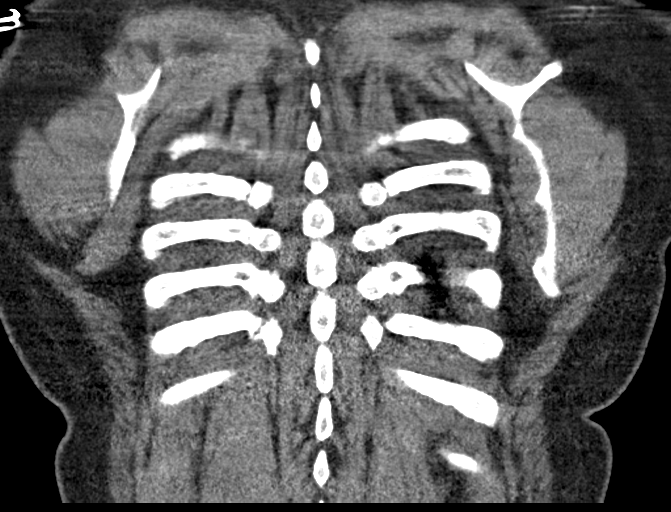

[18 of 46 positions shown; findings below may reference images not displayed]

FINDINGS: Cardiovascular: Pulmonary arterial opacification is adequate without
evidence of emboli within limitations of mild intermittent motion
artifact. The thoracic aorta is normal in caliber. The heart is
mildly enlarged status post CABG. There is no pericardial effusion.

Mediastinum/Nodes: No enlarged axillary, mediastinal, or hilar lymph
nodes. Unremarkable esophagus and included thyroid.

Lungs/Pleura: No pleural effusion or pneumothorax. Mild respiratory
motion artifact with minimal dependent atelectasis bilaterally. No
lung consolidation or mass.

Upper Abdomen: Unremarkable.

Musculoskeletal: No acute osseous abnormality or suspicious osseous
lesion.

Review of the MIP images confirms the above findings.
IMPRESSION: No evidence of pulmonary emboli or other acute abnormality in the
chest.

These results will be called to the ordering clinician or
representative by the [HOSPITAL] at the imaging location.

## 2021-05-22 DIAGNOSIS — A681 Tick-borne relapsing fever: Secondary | ICD-10-CM | POA: Diagnosis not present

## 2021-05-29 DIAGNOSIS — E1165 Type 2 diabetes mellitus with hyperglycemia: Secondary | ICD-10-CM | POA: Diagnosis not present

## 2021-05-30 ENCOUNTER — Telehealth: Payer: Self-pay

## 2021-05-30 DIAGNOSIS — S42222A 2-part displaced fracture of surgical neck of left humerus, initial encounter for closed fracture: Secondary | ICD-10-CM | POA: Diagnosis not present

## 2021-05-30 DIAGNOSIS — Z01818 Encounter for other preprocedural examination: Secondary | ICD-10-CM | POA: Diagnosis not present

## 2021-05-30 NOTE — Telephone Encounter (Signed)
   Name: Kimberly [name]  DOB: 1965-11-20  MRN: [id number]  Primary Cardiologist: None - primarily followed by Katina Dung NP after Dr. Court Joy departure so needs to establish with new primary cardiologist, preferably one that visits   Chart reviewed as part of pre-operative protocol coverage. Because of Kimberly [name]'s past medical history and time since last visit, she will require a follow-up in-office visit in order to better assess preoperative cardiovascular risk. At last visit 11/2020, recommended to f/u in 6 months. Given PMH this seems appropriate prior to surgery.  Pre-op covering staff: - Please schedule appointment and call patient to inform them. If patient already had an upcoming appointment within acceptable timeframe, please add "pre-op clearance" to the appointment notes so provider is aware. - Please contact requesting surgeon's office via preferred method (i.e, phone, fax) to inform them of need for appointment prior to surgery.  On ASA but request does not ask to hold.   Charlie Pitter, PA-C  05/30/2021, 5:29 PM

## 2021-05-30 NOTE — Telephone Encounter (Signed)
   Pre-operative Risk Assessment    Patient Name: Kimberly [name]  DOB: Jul 09, 1965 MRN: [id number]     Request for Surgical Clearance    Procedure:   Left Shoulder Arthroscopy  Date of Surgery:  Clearance TBD                                 Surgeon: NA Surgeon's Group or Practice Name:  [facility] Phone number:  [phone] Fax number:  [phone]   Type of Clearance Requested:   - Medical    Type of Anesthesia:  Not Indicated   NA  Signed, Sung Amabile   05/30/2021, 1:52 PM

## 2021-06-04 NOTE — Telephone Encounter (Signed)
Pt has appt 06/12/21 with Dr. Radford Pax for pre op. I will send FYI to requesting office the pt has appt 06/12/21.

## 2021-06-12 ENCOUNTER — Ambulatory Visit (INDEPENDENT_AMBULATORY_CARE_PROVIDER_SITE_OTHER): Payer: Medicare Other | Admitting: Cardiology

## 2021-06-12 ENCOUNTER — Telehealth: Payer: Self-pay | Admitting: Cardiology

## 2021-06-12 ENCOUNTER — Encounter: Payer: Self-pay | Admitting: Cardiology

## 2021-06-12 VITALS — BP 126/88 | Ht 66.0 in | Wt 189.0 lb

## 2021-06-12 DIAGNOSIS — R072 Precordial pain: Secondary | ICD-10-CM | POA: Diagnosis not present

## 2021-06-12 DIAGNOSIS — I2510 Atherosclerotic heart disease of native coronary artery without angina pectoris: Secondary | ICD-10-CM

## 2021-06-12 DIAGNOSIS — I10 Essential (primary) hypertension: Secondary | ICD-10-CM

## 2021-06-12 DIAGNOSIS — E7800 Pure hypercholesterolemia, unspecified: Secondary | ICD-10-CM | POA: Diagnosis not present

## 2021-06-12 DIAGNOSIS — Z720 Tobacco use: Secondary | ICD-10-CM | POA: Diagnosis not present

## 2021-06-12 LAB — LIPID PANEL
Chol/HDL Ratio: 4.4 ratio (ref 0.0–4.4)
Cholesterol, Total: 262 mg/dL — ABNORMAL HIGH (ref 100–199)
HDL: 60 mg/dL (ref >39)
LDL Chol Calc (NIH): 179 mg/dL — ABNORMAL HIGH (ref 0–99)
Triglycerides: 128 mg/dL (ref 0–149)
VLDL Cholesterol Cal: 23 mg/dL (ref 5–40)

## 2021-06-12 LAB — COMPREHENSIVE METABOLIC PANEL
ALT: 46 IU/L — ABNORMAL HIGH (ref 0–32)
AST: 34 IU/L (ref 0–40)
Albumin/Globulin Ratio: 1.7 (ref 1.2–2.2)
Albumin: 4.5 g/dL (ref 3.8–4.9)
Alkaline Phosphatase: 88 IU/L (ref 44–121)
BUN/Creatinine Ratio: 9 (ref 9–23)
BUN: 8 mg/dL (ref 6–24)
Bilirubin Total: 0.4 mg/dL (ref 0.0–1.2)
CO2: 23 mmol/L (ref 20–29)
Calcium: 9.7 mg/dL (ref 8.7–10.2)
Chloride: 105 mmol/L (ref 96–106)
Creatinine, Ser: 0.88 mg/dL (ref 0.57–1.00)
Globulin, Total: 2.7 g/dL (ref 1.5–4.5)
Glucose: 73 mg/dL (ref 70–99)
Potassium: 4.5 mmol/L (ref 3.5–5.2)
Sodium: 141 mmol/L (ref 134–144)
Total Protein: 7.2 g/dL (ref 6.0–8.5)
eGFR: 77 mL/min/{1.73_m2} (ref >59)

## 2021-06-12 NOTE — Telephone Encounter (Signed)
Checking percert on the following patient for testing scheduled at [facility]     LEXISCAN - 06/17/2021

## 2021-06-12 NOTE — Addendum Note (Signed)
Addended by: Antonieta Iba on: 06/12/2021 09:52 AM   Modules accepted: Orders

## 2021-06-12 NOTE — Patient Instructions (Addendum)
Medication Instructions:  Your physician recommends that you continue on your current medications as directed. Please refer to the Current Medication list given to you today.  *If you need a refill on your cardiac medications before your next appointment, please call your pharmacy*  Lab Work: TODAY: FLP and CMET If you have labs (blood work) drawn today and your tests are completely normal, you will receive your results only by: [place] (if you have MyChart) OR A paper copy in the mail If you have any lab test that is abnormal or we need to change your treatment, we will call you to review the results.  Testing/Procedures: Your physician has requested that you have a lexiscan myoview. For further information please visit [email or web address]. Please follow instruction sheet, as given.  Follow-Up: At [facility], you and your health needs are our priority.  As part of our continuing mission to provide you with exceptional heart care, we have created designated Provider Care Teams.  These Care Teams include your primary Cardiologist (physician) and Advanced Practice Providers (APPs -  Physician Assistants and Nurse Practitioners) who all work together to provide you with the care you need, when you need it.  Your next appointment:   6 month(s)  The format for your next appointment:   In Person  Provider:   You may see Dr. Domenic Polite or Dr. Harl Bowie or the following Advanced Practice Provider on your designated Care Team:   Katina Dung, [facility]  Important Information About Sugar

## 2021-06-12 NOTE — Progress Notes (Signed)
Cardiology Office Note  Date: 06/12/2021   ID: Finlay Mills, DOB [birth year], MRN [id number]  PCP:  Toma Deiters, MD  Cardiologist:  Armanda Magic, MD Electrophysiologist:  None   Chief Complaint: 81-month follow-up  History of Present Illness: Kimberly [name] is a 56 y.o. female with a history of CAD (one-vessel CABG with LIMA to LAD 04/11/2015, HTN, HLD, tobacco abuse, COPD, IDDM, palpitations.Her arterial study showed no evidence of lower extremity arterial disease.  Her echocardiogram Demonstrated EF 55 to 60%.  Mild LVH, G1 DD normal PASP.    She is here today for followup and is doing well.  She intermittently will have some gas under her left breast and belching. These symptoms come and go. She has also noticed increased SOB but is seeing Dr. Sherene Sires with Pulmonary and is being referred to ENT.  She denies any chest pain or pressure,  PND, orthopnea, LE edema, dizziness or syncope. She occasionally notices some palpitations mainly at night. She is compliant with her meds and is tolerating meds with no SE.      Past Medical History:  Diagnosis Date   Asthma    CAD (coronary artery disease) 04/06/2015   CABG with LIMA-LAD   Diabetes mellitus without complication (HCC)    Myocardial infarction (HCC)    Approx 2014 per pt report, no further details available   Ovarian cancer [facility])     Past Surgical History:  Procedure Laterality Date   ABDOMINAL HYSTERECTOMY     ovarian cancer   APPENDECTOMY     CARDIAC CATHETERIZATION N/A 04/08/2015   Procedure: Left Heart Cath and Coronary Angiography;  Surgeon: Corky Crafts, MD; LAD 95%, single vessel disease    CORONARY ARTERY BYPASS GRAFT N/A 04/11/2015   Procedure: CORONARY ARTERY BYPASS GRAFTING (CABG) x one using left internal mamary artery. ;  Surgeon: Kerin Perna, MD;  LIMA-LAD   TEE WITHOUT CARDIOVERSION N/A 04/11/2015   Procedure: TRANSESOPHAGEAL ECHOCARDIOGRAM (TEE);  Surgeon: Kerin Perna, MD;  Location: [facility] OR;   Service: Open Heart Surgery;  Laterality: N/A;    Current Outpatient Medications  Medication Sig Dispense Refill   escitalopram (LEXAPRO) 10 MG tablet Take 1 tablet by mouth daily.     albuterol (PROAIR HFA) 108 (90 Base) MCG/ACT inhaler 2 puffs every 4 hours as needed only  if your can't catch your breath 18 g 1   albuterol (PROVENTIL HFA;VENTOLIN HFA) 108 (90 Base) MCG/ACT inhaler Inhale 1 puff into the lungs every 6 (six) hours as needed for wheezing or shortness of breath. 54 g 3   albuterol (PROVENTIL) (2.5 MG/3ML) 0.083% nebulizer solution Take 3 mLs (2.5 mg total) by nebulization every 6 (six) hours as needed for wheezing or shortness of breath. 75 mL 12   amphetamine-dextroamphetamine (ADDERALL) 20 MG tablet Take 20 mg by mouth 2 (two) times daily.     aspirin EC 81 MG tablet Take 81 mg by mouth daily. Swallow whole.     Blood Glucose Monitoring Suppl (TRUE METRIX METER) w/Device KIT Use as directed 1 kit 0   Cholecalciferol (VITAMIN D3) 50 MCG (2000 UT) TABS Take by mouth.     citalopram (CELEXA) 20 MG tablet Take 20 mg by mouth daily.     cloNIDine (CATAPRES) 0.1 MG tablet Take 0.1 mg by mouth 2 (two) times daily.     cloNIDine HCl (KAPVAY) 0.1 MG TB12 ER tablet Take 0.2 mg by mouth every morning.     desvenlafaxine (PRISTIQ) 100 MG 24  hr tablet Take 100 mg by mouth daily.     diazepam (VALIUM) 5 MG tablet Take 1 tablet by mouth daily.     diclofenac Sodium (VOLTAREN) 1 % GEL Gram(s) Topical 4 Times Daily     FARXIGA 10 MG TABS tablet Take 10 mg by mouth every morning.     glucose blood (TRUE METRIX BLOOD GLUCOSE TEST) test strip Use as instructed 100 each 12   HUMALOG 100 UNIT/ML injection Inject into the skin as directed.     HYDROcodone-acetaminophen (NORCO/VICODIN) 5-325 MG tablet Take 1 tablet by mouth every 6 (six) hours as needed for moderate pain. Post op pain 20 tablet 0   hydrOXYzine (ATARAX/VISTARIL) 25 MG tablet Take 1 tablet (25 mg total) by mouth 3 (three) times daily  as needed for anxiety. 30 tablet 0   insulin aspart (NOVOLOG FLEXPEN) 100 UNIT/ML FlexPen As per sliding scale. 15 mL 11   Insulin Disposable Pump (OMNIPOD 5 G6 INTRO, GEN 5,) KIT by Does not apply route.     LORazepam (ATIVAN) 0.5 MG tablet Take 0.5 mg by mouth daily.      losartan (COZAAR) 100 MG tablet Take 100 mg by mouth daily.     Melatonin 3 MG TABS Take 1 tablet (3 mg total) by mouth at bedtime as needed. 30 tablet 0   metFORMIN (GLUCOPHAGE) 500 MG tablet Take by mouth 2 (two) times daily with a meal.     metoprolol tartrate (LOPRESSOR) 25 MG tablet TAKE 1 TABLET BY MOUTH TWICE A DAY 180 tablet 0   omeprazole (PRILOSEC) 40 MG capsule Take 40 mg by mouth daily.     OZEMPIC, 0.25 OR 0.5 MG/DOSE, 2 MG/3ML SOPN Inject 0.5 mg into the skin once a week.     pregabalin (LYRICA) 50 MG capsule Take 50 mg by mouth 3 (three) times daily.     rosuvastatin (CRESTOR) 40 MG tablet TAKE 1 TABLET BY MOUTH EVERY DAY 90 tablet 2   tiZANidine (ZANAFLEX) 4 MG tablet Take 4 mg by mouth 2 (two) times daily.     traZODone (DESYREL) 100 MG tablet Take by mouth.     TRUEPLUS LANCETS 28G MISC Use as directed 100 each 12   No current facility-administered medications for this visit.   Allergies:  Atomoxetine, Oxycodone, Oxycodone-acetaminophen, Phenazopyridine, Strattera [atomoxetine hcl], and Pyridium [phenazopyridine hcl]   Social History: The patient  reports that she has been smoking cigarettes. She started smoking about 41 years ago. She has a 21.00 pack-year smoking history. She has never used smokeless tobacco. She reports current alcohol use of about 3.0 standard drinks per week. She reports that she does not use drugs.   Family History: The patient's family history includes Diabetes in her mother; Heart disease in her maternal grandfather.   ROS:  Please see the history of present illness. Otherwise, complete review of systems is positive for none.  All other systems are reviewed and negative.    Physical Exam: VS:  BP 126/88   SpO2 97% , BMI There is no height or weight on file to calculate BMI.   Wt Readings from Last 3 Encounters:  11/25/20 187 lb (84.8 kg)  11/13/20 186 lb 1.9 oz (84.4 kg)  10/17/20 186 lb (84.4 kg)    GEN: Well nourished, well developed in no acute distress HEENT: Normal NECK: No JVD; No carotid bruits LYMPHATICS: No lymphadenopathy CARDIAC:RRR, no murmurs, rubs, gallops RESPIRATORY:  Clear to auscultation without rales, wheezing or rhonchi  ABDOMEN: Soft, non-tender,  non-distended MUSCULOSKELETAL:  No edema; No deformity  SKIN: Warm and dry NEUROLOGIC:  Alert and oriented x 3 PSYCHIATRIC:  Normal affect    ECG: NSR with no ST changes  Recent Labwork: No results found for requested labs within last 8760 hours.     Component Value Date/Time   CHOL 183 12/14/2018 1146   TRIG 171 (H) 12/14/2018 1146   HDL 49 12/14/2018 1146   CHOLHDL 3.7 12/14/2018 1146   CHOLHDL 4.3 04/06/2015 1816   VLDL 53 (H) 04/06/2015 1816   LDLCALC 104 (H) 12/14/2018 1146    Other Studies Reviewed Today:  US Arterial ABI 10/03/2020 CLINICAL DATA:  Right lower leg claudication and rest pain   EXAM: NONINVASIVE PHYSIOLOGIC VASCULAR STUDY OF BILATERAL LOWER EXTREMITIES   TECHNIQUE: Evaluation of both lower extremities were performed at rest, including calculation of ankle-brachial indices with single level Doppler, pressure and pulse volume recording.   COMPARISON:  None.   FINDINGS: Right ABI:  1.09   Left ABI:  1.19   Right Lower Extremity:  Normal arterial waveforms at the ankle.   Left Lower Extremity: Multiphasic waveform noted in the posterior tibial artery. Dorsalis pedis artery is monophasic.   1.0-1.4 Normal   IMPRESSION: No definitive evidence of significant lower extremity arterial occlusive disease.    Echocardiogram 10/03/2020   1. Left ventricular ejection fraction, by estimation, is 55 to 60%. The  left ventricle has normal  function. The left ventricle has no regional  wall motion abnormalities. There is mild left ventricular hypertrophy.  Left ventricular diastolic parameters  are consistent with Grade I diastolic dysfunction (impaired relaxation).   2. Right ventricular systolic function is normal. The right ventricular  size is normal. There is normal pulmonary artery systolic pressure.   3. The mitral valve is normal in structure. No evidence of mitral valve  regurgitation. No evidence of mitral stenosis.   4. The tricuspid valve is abnormal.   5. The aortic valve is tricuspid. Aortic valve regurgitation is not  visualized. No aortic stenosis is present.   6. The inferior vena cava is normal in size with greater than 50%  respiratory variability, suggesting right atrial pressure of 3 mmHg.   Lower arterial duplex 08/10/2019 Summary:  Right: Resting right ankle-brachial index is within normal range. No  evidence of significant right lower extremity arterial disease. The right  toe-brachial index is normal.   Left: Resting left ankle-brachial index is within normal range. No  evidence of significant left lower extremity arterial disease. The left  toe-brachial index is normal.   Cardiac catheterization 11/18/2017 conclusions: Obstructive native one-vessel coronary artery disease. Status post CABG with one-to-one grafts patent. Preserved left ventricular systolic function with regional wall motion as above.  Angio-Seal closure of right common femoral artery.    Echo: 04/08/2015 - Left ventricle: Distal septal hypokinesis The cavity size was   mildly dilated. Wall thickness was normal. Systolic function was   normal. The estimated ejection fraction was in the range of 50%   to 55%. Doppler parameters are consistent with elevated   ventricular end-diastolic filling pressure. - Aortic valve: There was mild regurgitation. - Atrial septum: No defect or patent foramen ovale was identified.   CATH:  04/08/2015 Ost LAD to Prox LAD lesion, 95% stenosed. Severe tortuosity in the right subclavian made torquing catheters quite difficult. Would not use the right radial in the future for access site for cardiac cath.  Severe ostial LAD stenosis. There is no landing zone for stent.  Fixing this percutaneously would require leaving stents in the distal left main and putting a large ramus and circumflex at risk to be jailed. She would also likely have to be on lifelong clopidogrel. I think a better option would be to have a surgical consult for possible LIMA to LAD. She has a good target vessel in the mid to distal LAD.   Assessment and Plan:   1. CAD in native artery -s/p one-vessel CABG with LIMA to LAD 04/11/2015 -Denies any anginal symptoms but has been having some problems with achiness under her left breast and belching and suspect this is related to GERD but also having SOB>>pulmonary workup done and referred to ENT for her SOB -I will get a Lexiscan myoview to rule out ischemia -Shared Decision Making/Informed Consent The risks [chest pain, shortness of breath, cardiac arrhythmias, dizziness, blood pressure fluctuations, myocardial infarction, stroke/transient ischemic attack, nausea, vomiting, allergic reaction, radiation exposure, metallic taste sensation and life-threatening complications (estimated to be 1 in 10,000)], benefits (risk stratification, diagnosing coronary artery disease, treatment guidance) and alternatives of a nuclear stress test were discussed in detail with Kimberly [name] and she agrees to proceed. -Continue prescription drug management with aspirin 81 mg daily, Lopressor 25 mg twice daily and statin therapy with as needed refills   2. Essential hypertension -BP is controlled on exam today -Continue prescription drug management with Lopressor 25 mg twice daily, clonidine 0.1 mg twice daily and losartan 100 mg daily with as needed refills -I have personally reviewed and  interpreted outside labs performed by patient's PCP which showed serum creatinine 0.71 on 11/19/2020 -Repeat bmet today  3. Hyperlipidemia LDL goal <70 -check FLP and ALT -continue Prescription drug management with Crestor $RemoveBefo'40mg'[id number]$  daily with PRN refills  4. Tobacco use -Patient states she has cut way back on cigarettes.  It takes 3-4 days to go through a pack of cigs -She is vaping at work and when driving -Advised cessation if possible.   Medication Adjustments/Labs and Tests Ordered: Current medicines are reviewed at length with the patient today.  Concerns regarding medicines are outlined above.   Disposition: Follow-up with Dr. Harl Bowie or Dr. Domenic Polite in [place] office in 6 months  Signed, Fransico Him, MD 06/12/2021 9:35 AM    [place]

## 2021-06-13 ENCOUNTER — Other Ambulatory Visit: Payer: Self-pay | Admitting: *Deleted

## 2021-06-13 DIAGNOSIS — E782 Mixed hyperlipidemia: Secondary | ICD-10-CM

## 2021-06-17 ENCOUNTER — Ambulatory Visit (HOSPITAL_COMMUNITY)
Admission: RE | Admit: 2021-06-17 | Discharge: 2021-06-17 | Disposition: A | Discharge status: Home / Self Care | Payer: Medicare Other | Source: Ambulatory Visit | Attending: Cardiology | Admitting: Cardiology

## 2021-06-17 ENCOUNTER — Encounter (HOSPITAL_COMMUNITY): Payer: Self-pay

## 2021-06-17 DIAGNOSIS — R072 Precordial pain: Secondary | ICD-10-CM | POA: Diagnosis not present

## 2021-06-17 LAB — NM MYOCAR MULTI W/SPECT W/WALL MOTION / EF
LV dias vol: 112 mL (ref 46–106)
LV sys vol: 64 mL
Nuc Stress EF: 43 %
Peak HR: 105 {beats}/min
RATE: 0.4
Rest HR: 62 {beats}/min
Rest Nuclear Isotope Dose: 10.4 mCi
SDS: 0
SRS: 3
SSS: 3
ST Depression (mm): 0 mm
Stress Nuclear Isotope Dose: 33 mCi
TID: 1.01

## 2021-06-17 MED ORDER — SODIUM CHLORIDE FLUSH 0.9 % IV SOLN
INTRAVENOUS | Status: AC
  Administered 2021-06-17: 10 mL via INTRAVENOUS
  Filled 2021-06-17: qty 10

## 2021-06-17 MED ORDER — REGADENOSON 0.4 MG/5ML IV SOLN
INTRAVENOUS | Status: AC
  Administered 2021-06-17: 0.4 mg via INTRAVENOUS
  Filled 2021-06-17: qty 5

## 2021-06-17 MED ORDER — TECHNETIUM TC 99M TETROFOSMIN IV KIT
10.0000 | PACK | Freq: Once | INTRAVENOUS | Status: AC | PRN
  Administered 2021-06-17: 10.4 via INTRAVENOUS

## 2021-06-17 MED ORDER — TECHNETIUM TC 99M TETROFOSMIN IV KIT
30.0000 | PACK | Freq: Once | INTRAVENOUS | Status: AC | PRN
Start: 2021-06-17 — End: 2021-06-17
  Administered 2021-06-17: 33 via INTRAVENOUS

## 2021-06-18 ENCOUNTER — Telehealth: Payer: Self-pay | Admitting: Cardiology

## 2021-06-18 DIAGNOSIS — R0600 Dyspnea, unspecified: Secondary | ICD-10-CM

## 2021-06-18 NOTE — Telephone Encounter (Signed)
Pt aware of myoview results ./cy 

## 2021-06-18 NOTE — Telephone Encounter (Signed)
Please let patient know that stress test showed prior infarct (known CAD with prior CABG) and no ischemia.   Do not think SOB is related to blockages.  EF down compared to echo 2022- please get 2D echo to reassess LVF and EF

## 2021-06-18 NOTE — Telephone Encounter (Signed)
Pt calling to see if she can get help understanding the results from her stress test. Requesting a call back.

## 2021-06-19 ENCOUNTER — Other Ambulatory Visit: Payer: Self-pay

## 2021-06-19 DIAGNOSIS — R0602 Shortness of breath: Secondary | ICD-10-CM

## 2021-06-24 ENCOUNTER — Other Ambulatory Visit: Payer: Medicare Other

## 2021-06-25 ENCOUNTER — Ambulatory Visit (INDEPENDENT_AMBULATORY_CARE_PROVIDER_SITE_OTHER): Payer: Medicare Other

## 2021-06-25 ENCOUNTER — Other Ambulatory Visit: Payer: Self-pay | Admitting: Cardiology

## 2021-06-25 DIAGNOSIS — R0602 Shortness of breath: Secondary | ICD-10-CM | POA: Diagnosis not present

## 2021-06-25 LAB — ECHOCARDIOGRAM COMPLETE
AR max vel: 1.32 cm2
AV Area VTI: 1.53 cm2
AV Area mean vel: 1.48 cm2
AV Mean grad: 5.5 mmHg
AV Peak grad: 12.1 mmHg
Ao pk vel: 1.74 m/s
Area-P 1/2: 2.82 cm2
Calc EF: 51.8 %
MV M vel: 4.8 m/s
MV Peak grad: 92.2 mmHg
S' Lateral: 3.78 cm
Single Plane A2C EF: 56.1 %
Single Plane A4C EF: 50.5 %

## 2021-06-26 ENCOUNTER — Telehealth: Payer: Self-pay | Admitting: Cardiology

## 2021-06-26 DIAGNOSIS — R0600 Dyspnea, unspecified: Secondary | ICD-10-CM

## 2021-06-26 NOTE — Telephone Encounter (Signed)
Spoke with patient and discussed results of echo.  Per Dr. Radford Pax: 2D echo showed low normal LV function with EF 50 to 55% and mildly thickened heart muscle.  There was mild leakiness of the mitral valve.  Endocardial borders were not optimally visualized.  Since it appears EF is dropped slightly please have patient come back for Definity contrast study for better assessment of EF  Patient verbalized understanding and would like to proceed with echocardiogram and [facility] office.

## 2021-06-26 NOTE — Telephone Encounter (Signed)
-----   Message from Sueanne Margarita, MD sent at 06/26/2021  8:56 AM EDT ----- 2D echo showed low normal LV function with EF 50 to 55% and mildly thickened heart muscle.  There was mild leakiness of the mitral valve.  Endocardial borders were not optimally visualized.  Since it appears EF is dropped slightly please have patient come back for Definity contrast study for better assessment of EF

## 2021-06-26 NOTE — Telephone Encounter (Signed)
Pt would like a nurse to give her a call to explain test results. Please advise

## 2021-06-27 ENCOUNTER — Telehealth: Payer: Self-pay | Admitting: Cardiology

## 2021-06-27 NOTE — Telephone Encounter (Signed)
Checking percert on the following patient for testing scheduled at [facility].    echo with Perflutren  07/04/2021

## 2021-07-04 ENCOUNTER — Ambulatory Visit (HOSPITAL_COMMUNITY)
Admission: RE | Admit: 2021-07-04 | Discharge: 2021-07-04 | Disposition: A | Discharge status: Home / Self Care | Payer: Medicare Other | Source: Ambulatory Visit | Attending: Cardiology | Admitting: Cardiology

## 2021-07-04 DIAGNOSIS — R0600 Dyspnea, unspecified: Secondary | ICD-10-CM | POA: Diagnosis not present

## 2021-07-04 DIAGNOSIS — R0609 Other forms of dyspnea: Secondary | ICD-10-CM | POA: Diagnosis not present

## 2021-07-04 MED ORDER — PERFLUTREN LIPID MICROSPHERE
1.0000 mL | INTRAVENOUS | Status: AC | PRN
  Administered 2021-07-04: 3 mL via INTRAVENOUS

## 2021-07-09 ENCOUNTER — Ambulatory Visit (INDEPENDENT_AMBULATORY_CARE_PROVIDER_SITE_OTHER): Payer: Medicare Other | Admitting: Podiatry

## 2021-07-09 DIAGNOSIS — M2042 Other hammer toe(s) (acquired), left foot: Secondary | ICD-10-CM

## 2021-07-09 DIAGNOSIS — M2041 Other hammer toe(s) (acquired), right foot: Secondary | ICD-10-CM | POA: Diagnosis not present

## 2021-07-09 DIAGNOSIS — E1100 Type 2 diabetes mellitus with hyperosmolarity without nonketotic hyperglycemic-hyperosmolar coma (NKHHC): Secondary | ICD-10-CM | POA: Diagnosis not present

## 2021-07-16 NOTE — Progress Notes (Signed)
Subjective: Kimberly [name] presents today referred by Neale Burly, MD for diabetic foot evaluation.  Patient relates 5 year history of diabetes.  Patient denies any history of foot wounds.  Patient denies any history of numbness, tingling, burning, pins/needles sensations.  Past Medical History:  Diagnosis Date   Asthma    CAD (coronary artery disease) 04/06/2015   CABG with LIMA-LAD   Diabetes mellitus without complication ([place])    Myocardial infarction ([place])    Approx 2014 per pt report, no further details available   Ovarian cancer [facility])     Patient Active Problem List   Diagnosis Date Noted   DOE (dyspnea on exertion) 11/13/2020   Encounter for screening fecal occult blood testing 10/17/2020   Visit for pelvic exam 10/17/2020   History of ovarian cancer 10/17/2020   S/P hysterectomy with oophorectomy 10/17/2020   Essential hypertension 11/30/2019   Impingement syndrome of right shoulder 08/10/2019   Trigger finger, right middle finger 12/28/2018   Hyperlipidemia associated with type 2 diabetes mellitus ([place]) 05/06/2015   Adjustment reaction with mixed emotional features 04/22/2015   S/P CABG x 1 04/11/2015   Unstable angina (HCC)    Hypertensive heart disease without heart failure    Diabetes mellitus, type 2 ([place]) 04/06/2015   Hyperlipidemia 04/06/2015   ADHD (attention deficit hyperactivity disorder) 04/06/2015   Cigarette smoker 04/06/2015   Cough variant asthma vs uacs  04/06/2015    Past Surgical History:  Procedure Laterality Date   ABDOMINAL HYSTERECTOMY     ovarian cancer   APPENDECTOMY     CARDIAC CATHETERIZATION N/A 04/08/2015   Procedure: Left Heart Cath and Coronary Angiography;  Surgeon: Jettie Booze, MD; LAD 95%, single vessel disease    CORONARY ARTERY BYPASS GRAFT N/A 04/11/2015   Procedure: CORONARY ARTERY BYPASS GRAFTING (CABG) x one using left internal mamary artery. ;  Surgeon: Ivin Poot, MD;  LIMA-LAD   TEE WITHOUT  CARDIOVERSION N/A 04/11/2015   Procedure: TRANSESOPHAGEAL ECHOCARDIOGRAM (TEE);  Surgeon: Ivin Poot, MD;  Location: [place];  Service: Open Heart Surgery;  Laterality: N/A;    Current Outpatient Medications on File Prior to Visit  Medication Sig Dispense Refill   albuterol (PROAIR HFA) 108 (90 Base) MCG/ACT inhaler 2 puffs every 4 hours as needed only  if your can't catch your breath 18 g 1   albuterol (PROVENTIL HFA;VENTOLIN HFA) 108 (90 Base) MCG/ACT inhaler Inhale 1 puff into the lungs every 6 (six) hours as needed for wheezing or shortness of breath. 54 g 3   albuterol (PROVENTIL) (2.5 MG/3ML) 0.083% nebulizer solution Take 3 mLs (2.5 mg total) by nebulization every 6 (six) hours as needed for wheezing or shortness of breath. 75 mL 12   amphetamine-dextroamphetamine (ADDERALL) 20 MG tablet Take 20 mg by mouth 2 (two) times daily.     aspirin EC 81 MG tablet Take 81 mg by mouth daily. Swallow whole.     Blood Glucose Monitoring Suppl (TRUE METRIX METER) w/Device KIT Use as directed 1 kit 0   Cholecalciferol (VITAMIN D3) 50 MCG (2000 UT) TABS Take by mouth.     citalopram (CELEXA) 20 MG tablet Take 20 mg by mouth daily.     cloNIDine (CATAPRES) 0.1 MG tablet Take 0.1 mg by mouth 2 (two) times daily.     cloNIDine HCl (KAPVAY) 0.1 MG TB12 ER tablet Take 0.2 mg by mouth every morning.     desvenlafaxine (PRISTIQ) 100 MG 24 hr tablet Take 100 mg by mouth  daily.     diazepam (VALIUM) 5 MG tablet Take 1 tablet by mouth daily.     diclofenac Sodium (VOLTAREN) 1 % GEL Gram(s) Topical 4 Times Daily     escitalopram (LEXAPRO) 10 MG tablet Take 1 tablet by mouth daily.     FARXIGA 10 MG TABS tablet Take 10 mg by mouth every morning.     glucose blood (TRUE METRIX BLOOD GLUCOSE TEST) test strip Use as instructed 100 each 12   HUMALOG 100 UNIT/ML injection Inject into the skin as directed.     HYDROcodone-acetaminophen (NORCO/VICODIN) 5-325 MG tablet Take 1 tablet by mouth every 6 (six) hours as  needed for moderate pain. Post op pain 20 tablet 0   hydrOXYzine (ATARAX/VISTARIL) 25 MG tablet Take 1 tablet (25 mg total) by mouth 3 (three) times daily as needed for anxiety. 30 tablet 0   insulin aspart (NOVOLOG FLEXPEN) 100 UNIT/ML FlexPen As per sliding scale. 15 mL 11   Insulin Disposable Pump (OMNIPOD 5 G6 INTRO, GEN 5,) KIT by Does not apply route.     LORazepam (ATIVAN) 0.5 MG tablet Take 0.5 mg by mouth daily.      losartan (COZAAR) 100 MG tablet Take 100 mg by mouth daily.     Melatonin 3 MG TABS Take 1 tablet (3 mg total) by mouth at bedtime as needed. 30 tablet 0   metFORMIN (GLUCOPHAGE) 500 MG tablet Take by mouth 2 (two) times daily with a meal.     metoprolol tartrate (LOPRESSOR) 25 MG tablet TAKE 1 TABLET BY MOUTH TWICE A DAY 180 tablet 0   omeprazole (PRILOSEC) 40 MG capsule Take 40 mg by mouth daily.     OZEMPIC, 0.25 OR 0.5 MG/DOSE, 2 MG/3ML SOPN Inject 0.5 mg into the skin once a week.     pregabalin (LYRICA) 50 MG capsule Take 50 mg by mouth 3 (three) times daily.     rosuvastatin (CRESTOR) 40 MG tablet TAKE 1 TABLET BY MOUTH EVERY DAY 90 tablet 2   tiZANidine (ZANAFLEX) 4 MG tablet Take 4 mg by mouth 2 (two) times daily.     traZODone (DESYREL) 100 MG tablet Take by mouth.     TRUEPLUS LANCETS 28G MISC Use as directed 100 each 12   No current facility-administered medications on file prior to visit.     Allergies  Allergen Reactions   Atomoxetine     Other reaction(s): Other (See Comments) Caused yeast infection per [facility] records    Oxycodone    Oxycodone-Acetaminophen    Phenazopyridine    Strattera [Atomoxetine Hcl] Other (See Comments)    Caused yeast infection per [facility] records   Pyridium [Phenazopyridine Hcl] Itching    Social History   Occupational History   Not on file  Tobacco Use   Smoking status: Some Days    Packs/day: 0.50    Years: 42.00    Total pack years: 21.00    Types: Cigarettes    Start date: 09/14/1979   Smokeless  tobacco: Never   Tobacco comments:    smokes 6 cigarettes per day --11/30/2019  Vaping Use   Vaping Use: Former   Start date: 09/13/2019   Quit date: 10/02/2020  Substance and Sexual Activity   Alcohol use: Yes    Alcohol/week: 3.0 standard drinks of alcohol    Types: 3 Glasses of wine per week    Comment: occasional   Drug use: No   Sexual activity: Yes    Birth control/protection: None, Surgical  Comment: hysterectomy    Family History  Problem Relation Age of Onset   Heart disease Maternal Grandfather    Diabetes Mother     Immunization History  Administered Date(s) Administered   Pneumococcal Polysaccharide-23 04/26/2017, 12/14/2018    Review of systems: Positive Findings in bold print.  Constitutional:  chills, fatigue, fever, sweats, weight change Communication: Optometrist, sign Ecologist, hand writing, iPad/Android device Head: headaches, head injury Eyes: changes in vision, eye pain, glaucoma, cataracts, macular degeneration, diplopia, glare,  light sensitivity, eyeglasses or contacts, blindness Ears nose mouth throat: hearing impaired, hearing aids,  ringing in ears, deaf, sign language,  vertigo, nosebleeds,  rhinitis,  cold sores, snoring, swollen glands Cardiovascular: HTN, edema, arrhythmia, pacemaker in place, defibrillator in place, chest pain/tightness, chronic anticoagulation, blood clot, heart failure, MI Peripheral Vascular: leg cramps, varicose veins, blood clots, lymphedema, varicosities Respiratory:  asthma, difficulty breathing, denies congestion, SOB, wheezing, cough, emphysema Gastrointestinal: change in appetite or weight, abdominal pain, constipation, diarrhea, nausea, vomiting, vomiting blood, change in bowel habits, abdominal pain, jaundice, rectal bleeding, hemorrhoids, GERD Genitourinary:  nocturia,  pain on urination, polyuria,  blood in urine, Foley catheter, urinary urgency, ESRD on hemodialysis Musculoskeletal: amputation, cramping,  stiff joints, painful joints, decreased joint motion, fractures, OA, gout, hemiplegia, paraplegia, uses cane, wheelchair bound, uses walker, uses rollator Skin: +changes in toenails, color change, dryness, itching, mole changes,  rash, wound(s) Neurological: headaches, numbness in feet, paresthesias in feet, burning in feet, fainting,  seizures, change in speech, migraines, memory problems/poor historian, cerebral palsy, weakness, paralysis, CVA, TIA Endocrine: diabetes, hypothyroidism, hyperthyroidism,  goiter, dry mouth, flushing, heat intolerance, cold intolerance,  excessive thirst, denies polyuria,  nocturia Hematological:  easy bleeding, excessive bleeding, easy bruising, enlarged lymph nodes, on long term blood thinner, history of past transusions Allergy/immunological:  hives, eczema, frequent infections, multiple drug allergies, seasonal allergies, transplant recipient, multiple food allergies Psychiatric:  anxiety, depression, mood disorder, suicidal ideations, hallucinations, insomnia  Objective: There were no vitals filed for this visit. Vascular Examination: Capillary refill time less than 3 seconds x 10 digits.  Dorsalis pedis pulses palpable 2 out of 4.  Posterior tibial pulses palpable 2 out of 4.  Digital hair not present x 10 digits.  Skin temperature gradient WNL b/l.  Dermatological Examination: Skin with normal turgor, texture and tone b/l  Toenails 1-5 b/l discolored, thick, dystrophic with subungual debris and pain with palpation to nailbeds due to thickness of nails.  Hammertoe contracture bilateral noted.  No ulceration noted.  Musculoskeletal: Muscle strength 5/5 to all LE muscle groups.  Neurological: Sensation intact with 10 gram monofilament.  Vibratory sensation intact.  Assessment: NIDDM Encounter for diabetic foot examination Hammertoe contracture bilateral  Plan: Discussed diabetic foot care principles. Literature dispensed on today. Patient  to continue soft, supportive shoe gear daily. Patient to report any pedal injuries to medical professional immediately. Follow up one year. Patient/POA to call should there be a concern in the interim. Given that patient has hammertoe contracture I believe patient will benefit from diabetic shoes.  She will be scheduled to obtain diabetic shoes.

## 2021-07-18 DIAGNOSIS — Z79899 Other long term (current) drug therapy: Secondary | ICD-10-CM | POA: Diagnosis not present

## 2021-07-18 DIAGNOSIS — Z5181 Encounter for therapeutic drug level monitoring: Secondary | ICD-10-CM | POA: Diagnosis not present

## 2021-07-23 ENCOUNTER — Ambulatory Visit: Payer: Medicare Other

## 2021-07-23 NOTE — Progress Notes (Deleted)
Patient ID: Kimberly [name]                 DOB: 1965-02-07                    MRN: [id number]     HPI: Kimberly [name] is a 56 y.o. female patient referred to lipid clinic by Dr. Radford Pax. PMH is significant for  CAD (one-vessel CABG with LIMA to LAD 04/11/2015, HTN, HLD, DM, tobacco abuse, COPD and palpitations. Patient's most recent labs showed an LDL-C of 179. Patient admitted to missing some doses.   Will need PCKS9i to get to goal regardless Smoking Dual complete  Current Medications: rosuvastatin 40mg  daily  Intolerances:  Risk Factors: premature CAD, HTN, tobacco abuse, DM LDL goal: <55 APO B goal: <70  Diet:   Exercise:   Family History: The patient's family history includes Diabetes in her mother; Heart disease in her maternal grandfather.  Social History:   Labs: 06/12/21 TC 262 TG 128 HDL 60 LDL-C 179 (rosuvastatin 40mg  daily - but pt missing doses)  Past Medical History:  Diagnosis Date   Asthma    CAD (coronary artery disease) 04/06/2015   CABG with LIMA-LAD   Diabetes mellitus without complication ([place])    Myocardial infarction ([place])    Approx 2014 per pt report, no further details available   Ovarian cancer [facility])     Current Outpatient Medications on File Prior to Visit  Medication Sig Dispense Refill   albuterol (PROAIR HFA) 108 (90 Base) MCG/ACT inhaler 2 puffs every 4 hours as needed only  if your can't catch your breath 18 g 1   albuterol (PROVENTIL HFA;VENTOLIN HFA) 108 (90 Base) MCG/ACT inhaler Inhale 1 puff into the lungs every 6 (six) hours as needed for wheezing or shortness of breath. 54 g 3   albuterol (PROVENTIL) (2.5 MG/3ML) 0.083% nebulizer solution Take 3 mLs (2.5 mg total) by nebulization every 6 (six) hours as needed for wheezing or shortness of breath. 75 mL 12   amphetamine-dextroamphetamine (ADDERALL) 20 MG tablet Take 20 mg by mouth 2 (two) times daily.     aspirin EC 81 MG tablet Take 81 mg by mouth daily. Swallow whole.     Blood Glucose  Monitoring Suppl (TRUE METRIX METER) w/Device KIT Use as directed 1 kit 0   Cholecalciferol (VITAMIN D3) 50 MCG (2000 UT) TABS Take by mouth.     citalopram (CELEXA) 20 MG tablet Take 20 mg by mouth daily.     cloNIDine (CATAPRES) 0.1 MG tablet Take 0.1 mg by mouth 2 (two) times daily.     cloNIDine HCl (KAPVAY) 0.1 MG TB12 ER tablet Take 0.2 mg by mouth every morning.     desvenlafaxine (PRISTIQ) 100 MG 24 hr tablet Take 100 mg by mouth daily.     diazepam (VALIUM) 5 MG tablet Take 1 tablet by mouth daily.     diclofenac Sodium (VOLTAREN) 1 % GEL Gram(s) Topical 4 Times Daily     escitalopram (LEXAPRO) 10 MG tablet Take 1 tablet by mouth daily.     FARXIGA 10 MG TABS tablet Take 10 mg by mouth every morning.     glucose blood (TRUE METRIX BLOOD GLUCOSE TEST) test strip Use as instructed 100 each 12   HUMALOG 100 UNIT/ML injection Inject into the skin as directed.     HYDROcodone-acetaminophen (NORCO/VICODIN) 5-325 MG tablet Take 1 tablet by mouth every 6 (six) hours as needed for moderate pain. Post op pain 20 tablet  0   hydrOXYzine (ATARAX/VISTARIL) 25 MG tablet Take 1 tablet (25 mg total) by mouth 3 (three) times daily as needed for anxiety. 30 tablet 0   insulin aspart (NOVOLOG FLEXPEN) 100 UNIT/ML FlexPen As per sliding scale. 15 mL 11   Insulin Disposable Pump (OMNIPOD 5 G6 INTRO, GEN 5,) KIT by Does not apply route.     LORazepam (ATIVAN) 0.5 MG tablet Take 0.5 mg by mouth daily.      losartan (COZAAR) 100 MG tablet Take 100 mg by mouth daily.     Melatonin 3 MG TABS Take 1 tablet (3 mg total) by mouth at bedtime as needed. 30 tablet 0   metFORMIN (GLUCOPHAGE) 500 MG tablet Take by mouth 2 (two) times daily with a meal.     metoprolol tartrate (LOPRESSOR) 25 MG tablet TAKE 1 TABLET BY MOUTH TWICE A DAY 180 tablet 0   omeprazole (PRILOSEC) 40 MG capsule Take 40 mg by mouth daily.     OZEMPIC, 0.25 OR 0.5 MG/DOSE, 2 MG/3ML SOPN Inject 0.5 mg into the skin once a week.     pregabalin  (LYRICA) 50 MG capsule Take 50 mg by mouth 3 (three) times daily.     rosuvastatin (CRESTOR) 40 MG tablet TAKE 1 TABLET BY MOUTH EVERY DAY 90 tablet 2   tiZANidine (ZANAFLEX) 4 MG tablet Take 4 mg by mouth 2 (two) times daily.     traZODone (DESYREL) 100 MG tablet Take by mouth.     TRUEPLUS LANCETS 28G MISC Use as directed 100 each 12   No current facility-administered medications on file prior to visit.    Allergies  Allergen Reactions   Atomoxetine     Other reaction(s): Other (See Comments) Caused yeast infection per [facility] records    Oxycodone    Oxycodone-Acetaminophen    Phenazopyridine    Strattera [Atomoxetine Hcl] Other (See Comments)    Caused yeast infection per [facility] records   Pyridium [Phenazopyridine Hcl] Itching    Assessment/Plan:  1. Hyperlipidemia -    Thank you,   Ramond Dial, Pharm.D, BCPS, CPP [facility]  [address] [place], [place] [address]  Phone: [phone]; Fax: [phone]

## 2021-07-30 DIAGNOSIS — Z0000 Encounter for general adult medical examination without abnormal findings: Secondary | ICD-10-CM | POA: Diagnosis not present

## 2021-08-07 NOTE — Telephone Encounter (Signed)
[facility], requesting call back to discuss updates on surgical clearance.

## 2021-08-07 NOTE — Telephone Encounter (Signed)
Pt has clearance in from June, saw Dr. Radford Pax and had testing done, but surgeon's office never got clearance.  Please advise!

## 2021-08-07 NOTE — Telephone Encounter (Signed)
   Patient Name: Kimberly [name]  DOB: [birth year] MRN: [id number]  Primary Cardiologist: Fransico Him, MD  Chart reviewed as part of pre-operative protocol coverage. Per Dr. Radford Pax, primary cardiologist, given past medical history and time since last visit, based on ACC/AHA guidelines, Mashell Sieben would be at acceptable risk for the planned procedure without further cardiovascular testing.   I will route this recommendation to the requesting party via Epic fax function and remove from pre-op pool.  Please call with questions.  Lenna Sciara, NP 08/07/2021, 5:18 PM

## 2021-08-12 DIAGNOSIS — Z1231 Encounter for screening mammogram for malignant neoplasm of breast: Secondary | ICD-10-CM | POA: Diagnosis not present

## 2021-08-18 ENCOUNTER — Telehealth: Payer: Self-pay | Admitting: Cardiology

## 2021-08-18 DIAGNOSIS — M75102 Unspecified rotator cuff tear or rupture of left shoulder, not specified as traumatic: Secondary | ICD-10-CM | POA: Diagnosis not present

## 2021-08-18 DIAGNOSIS — M7552 Bursitis of left shoulder: Secondary | ICD-10-CM | POA: Diagnosis not present

## 2021-08-18 DIAGNOSIS — M25512 Pain in left shoulder: Secondary | ICD-10-CM | POA: Diagnosis not present

## 2021-08-18 DIAGNOSIS — M25511 Pain in right shoulder: Secondary | ICD-10-CM | POA: Diagnosis not present

## 2021-08-18 DIAGNOSIS — G8929 Other chronic pain: Secondary | ICD-10-CM | POA: Diagnosis not present

## 2021-08-18 NOTE — Telephone Encounter (Signed)
I have faxed over cardiac recommendations to [facility].

## 2021-08-18 NOTE — Telephone Encounter (Signed)
[facility] is calling back stating they still haven't received fax from this morning. Requesting this to be faxed again.  Updated fax: [phone]

## 2021-08-18 NOTE — Telephone Encounter (Signed)
See Clearance note from 05/30/21, clearance was faxed to the wrong number. Updated fax number: [phone]

## 2021-08-19 DIAGNOSIS — F32A Depression, unspecified: Secondary | ICD-10-CM | POA: Diagnosis not present

## 2021-08-19 DIAGNOSIS — I10 Essential (primary) hypertension: Secondary | ICD-10-CM | POA: Diagnosis not present

## 2021-08-19 DIAGNOSIS — F17210 Nicotine dependence, cigarettes, uncomplicated: Secondary | ICD-10-CM | POA: Diagnosis not present

## 2021-08-19 DIAGNOSIS — Z299 Encounter for prophylactic measures, unspecified: Secondary | ICD-10-CM | POA: Diagnosis not present

## 2021-08-19 DIAGNOSIS — E119 Type 2 diabetes mellitus without complications: Secondary | ICD-10-CM | POA: Diagnosis not present

## 2021-08-19 NOTE — Telephone Encounter (Signed)
Primary Cardiologist:Traci Radford Pax, MD   Preoperative team, please contact this patient and set up a phone call appointment for further preoperative risk assessment. Please obtain consent and complete medication review. Thank you for your help.   I confirm that guidance regarding antiplatelet and oral anticoagulation therapy has been completed and, if necessary, noted below (none requested). Patient is on aspirin 81 mg with preference to continue aspirin perioperatively but it may be held 5-7 days if deemed necessary.    Emmaline Life, NP-C    08/19/2021, [place] [address], [address] Office [phone] Fax [phone]

## 2021-08-19 NOTE — Telephone Encounter (Signed)
   Pre-operative Risk Assessment    Patient Name: Kimberly [name]  DOB: 1965-08-15 MRN: [id number]      Request for Surgical Clearance    Procedure:   Shoulder Atheroscopy  Date of Surgery:  Clearance 08/26/21                                 Surgeon:  Dr. Sheliah Plane Surgeon's Group or Practice Name:  [facility] in [place] Phone number:  [phone] Fax number:  [phone]   Type of Clearance Requested:   - Medical    Type of Anesthesia:  General    Additional requests/questions:    Caller is looking for surgical clearance for this procedure.   Signed, Heloise Beecham   08/19/2021, 10:40 AM

## 2021-08-19 NOTE — Telephone Encounter (Signed)
I left a message for the patient to return my call to set up a tele visit for clearance.

## 2021-08-20 ENCOUNTER — Ambulatory Visit: Payer: Medicare Other | Admitting: Student

## 2021-08-20 ENCOUNTER — Telehealth: Payer: Self-pay | Admitting: Cardiology

## 2021-08-20 NOTE — Telephone Encounter (Signed)
See clearance notes  

## 2021-08-20 NOTE — Telephone Encounter (Signed)
Pt returning call regarding Tele Visit appt. Please advise

## 2021-08-20 NOTE — Telephone Encounter (Signed)
Pt called our office today about her clearance. I reviewed the chart and stated to the pt that what I see in the chart is that she was cleared 08/07/21 and clearance notes were faxed to requesting office. I did tell the pt though let me confirm with the pre op provider today and that the tele appt is not needed. Pt had scheduled herself an in person appt for today.   I have confirmed with the pre op provider Christen Bame, NP that the pt ha been cleared and appt is not needed. I called the pt back and informed her she has been cleared as of 08/07/21 with notes faxed at that time. She asked for me to call Erin at Dr. Deniece Ree office and let her know. Fax # to send clearance to is [phone], not [phone].

## 2021-08-26 DIAGNOSIS — M19012 Primary osteoarthritis, left shoulder: Secondary | ICD-10-CM | POA: Diagnosis not present

## 2021-08-26 DIAGNOSIS — E119 Type 2 diabetes mellitus without complications: Secondary | ICD-10-CM | POA: Diagnosis not present

## 2021-08-26 DIAGNOSIS — I2510 Atherosclerotic heart disease of native coronary artery without angina pectoris: Secondary | ICD-10-CM | POA: Diagnosis not present

## 2021-08-26 DIAGNOSIS — M75101 Unspecified rotator cuff tear or rupture of right shoulder, not specified as traumatic: Secondary | ICD-10-CM | POA: Diagnosis not present

## 2021-08-26 DIAGNOSIS — I10 Essential (primary) hypertension: Secondary | ICD-10-CM | POA: Diagnosis not present

## 2021-08-26 DIAGNOSIS — Z794 Long term (current) use of insulin: Secondary | ICD-10-CM | POA: Diagnosis not present

## 2021-08-26 DIAGNOSIS — M75122 Complete rotator cuff tear or rupture of left shoulder, not specified as traumatic: Secondary | ICD-10-CM | POA: Diagnosis not present

## 2021-08-26 DIAGNOSIS — M67812 Other specified disorders of synovium, left shoulder: Secondary | ICD-10-CM | POA: Diagnosis not present

## 2021-08-26 DIAGNOSIS — Z79899 Other long term (current) drug therapy: Secondary | ICD-10-CM | POA: Diagnosis not present

## 2021-08-26 DIAGNOSIS — Z7982 Long term (current) use of aspirin: Secondary | ICD-10-CM | POA: Diagnosis not present

## 2021-08-26 DIAGNOSIS — Z885 Allergy status to narcotic agent status: Secondary | ICD-10-CM | POA: Diagnosis not present

## 2021-08-26 DIAGNOSIS — K219 Gastro-esophageal reflux disease without esophagitis: Secondary | ICD-10-CM | POA: Diagnosis not present

## 2021-08-26 DIAGNOSIS — M75102 Unspecified rotator cuff tear or rupture of left shoulder, not specified as traumatic: Secondary | ICD-10-CM | POA: Diagnosis not present

## 2021-08-26 DIAGNOSIS — S43432A Superior glenoid labrum lesion of left shoulder, initial encounter: Secondary | ICD-10-CM | POA: Diagnosis not present

## 2021-08-26 DIAGNOSIS — F17210 Nicotine dependence, cigarettes, uncomplicated: Secondary | ICD-10-CM | POA: Diagnosis not present

## 2021-08-26 DIAGNOSIS — G8918 Other acute postprocedural pain: Secondary | ICD-10-CM | POA: Diagnosis not present

## 2021-08-26 DIAGNOSIS — Z7984 Long term (current) use of oral hypoglycemic drugs: Secondary | ICD-10-CM | POA: Diagnosis not present

## 2021-08-26 DIAGNOSIS — M7522 Bicipital tendinitis, left shoulder: Secondary | ICD-10-CM | POA: Diagnosis not present

## 2021-08-26 DIAGNOSIS — Z7985 Long-term (current) use of injectable non-insulin antidiabetic drugs: Secondary | ICD-10-CM | POA: Diagnosis not present

## 2021-08-26 DIAGNOSIS — Z951 Presence of aortocoronary bypass graft: Secondary | ICD-10-CM | POA: Diagnosis not present

## 2021-08-27 DIAGNOSIS — M25512 Pain in left shoulder: Secondary | ICD-10-CM | POA: Diagnosis not present

## 2021-08-27 DIAGNOSIS — G8918 Other acute postprocedural pain: Secondary | ICD-10-CM | POA: Diagnosis not present

## 2021-09-09 ENCOUNTER — Telehealth: Payer: Self-pay | Admitting: Podiatry

## 2021-09-09 NOTE — Telephone Encounter (Signed)
Spoke with patient call back after 9/1 to schedule , she is going to PT for her shoulder surgery.

## 2021-09-12 DIAGNOSIS — F17210 Nicotine dependence, cigarettes, uncomplicated: Secondary | ICD-10-CM | POA: Diagnosis not present

## 2021-09-12 DIAGNOSIS — H349 Unspecified retinal vascular occlusion: Secondary | ICD-10-CM | POA: Diagnosis not present

## 2021-09-12 DIAGNOSIS — Z299 Encounter for prophylactic measures, unspecified: Secondary | ICD-10-CM | POA: Diagnosis not present

## 2021-09-12 DIAGNOSIS — R519 Headache, unspecified: Secondary | ICD-10-CM | POA: Diagnosis not present

## 2021-09-12 DIAGNOSIS — E1165 Type 2 diabetes mellitus with hyperglycemia: Secondary | ICD-10-CM | POA: Diagnosis not present

## 2021-09-12 DIAGNOSIS — I10 Essential (primary) hypertension: Secondary | ICD-10-CM | POA: Diagnosis not present

## 2021-09-17 ENCOUNTER — Other Ambulatory Visit: Payer: Self-pay | Admitting: *Deleted

## 2021-09-17 DIAGNOSIS — M7552 Bursitis of left shoulder: Secondary | ICD-10-CM | POA: Diagnosis not present

## 2021-09-17 DIAGNOSIS — M25512 Pain in left shoulder: Secondary | ICD-10-CM | POA: Diagnosis not present

## 2021-09-17 DIAGNOSIS — G8929 Other chronic pain: Secondary | ICD-10-CM | POA: Diagnosis not present

## 2021-09-17 DIAGNOSIS — M7522 Bicipital tendinitis, left shoulder: Secondary | ICD-10-CM | POA: Diagnosis not present

## 2021-09-17 DIAGNOSIS — M75102 Unspecified rotator cuff tear or rupture of left shoulder, not specified as traumatic: Secondary | ICD-10-CM | POA: Diagnosis not present

## 2021-09-17 MED ORDER — ROSUVASTATIN CALCIUM 40 MG PO TABS: 40.0000 mg | ORAL_TABLET | Freq: Every day | ORAL | 2 refills | Status: DC

## 2021-09-22 DIAGNOSIS — M75102 Unspecified rotator cuff tear or rupture of left shoulder, not specified as traumatic: Secondary | ICD-10-CM | POA: Diagnosis not present

## 2021-09-22 DIAGNOSIS — G8929 Other chronic pain: Secondary | ICD-10-CM | POA: Diagnosis not present

## 2021-09-22 DIAGNOSIS — M7522 Bicipital tendinitis, left shoulder: Secondary | ICD-10-CM | POA: Diagnosis not present

## 2021-09-22 DIAGNOSIS — M7552 Bursitis of left shoulder: Secondary | ICD-10-CM | POA: Diagnosis not present

## 2021-09-22 DIAGNOSIS — M25512 Pain in left shoulder: Secondary | ICD-10-CM | POA: Diagnosis not present

## 2021-09-25 DIAGNOSIS — M75102 Unspecified rotator cuff tear or rupture of left shoulder, not specified as traumatic: Secondary | ICD-10-CM | POA: Diagnosis not present

## 2021-09-25 DIAGNOSIS — M7522 Bicipital tendinitis, left shoulder: Secondary | ICD-10-CM | POA: Diagnosis not present

## 2021-09-25 DIAGNOSIS — M7552 Bursitis of left shoulder: Secondary | ICD-10-CM | POA: Diagnosis not present

## 2021-09-25 DIAGNOSIS — G8929 Other chronic pain: Secondary | ICD-10-CM | POA: Diagnosis not present

## 2021-09-25 DIAGNOSIS — M25512 Pain in left shoulder: Secondary | ICD-10-CM | POA: Diagnosis not present

## 2021-09-30 DIAGNOSIS — M7552 Bursitis of left shoulder: Secondary | ICD-10-CM | POA: Diagnosis not present

## 2021-09-30 DIAGNOSIS — M75102 Unspecified rotator cuff tear or rupture of left shoulder, not specified as traumatic: Secondary | ICD-10-CM | POA: Diagnosis not present

## 2021-09-30 DIAGNOSIS — G8929 Other chronic pain: Secondary | ICD-10-CM | POA: Diagnosis not present

## 2021-09-30 DIAGNOSIS — M25512 Pain in left shoulder: Secondary | ICD-10-CM | POA: Diagnosis not present

## 2021-09-30 DIAGNOSIS — M7522 Bicipital tendinitis, left shoulder: Secondary | ICD-10-CM | POA: Diagnosis not present

## 2021-10-06 DIAGNOSIS — Z0000 Encounter for general adult medical examination without abnormal findings: Secondary | ICD-10-CM | POA: Diagnosis not present

## 2021-10-06 DIAGNOSIS — I10 Essential (primary) hypertension: Secondary | ICD-10-CM | POA: Diagnosis not present

## 2021-10-06 DIAGNOSIS — E1143 Type 2 diabetes mellitus with diabetic autonomic (poly)neuropathy: Secondary | ICD-10-CM | POA: Diagnosis not present

## 2021-10-06 DIAGNOSIS — E785 Hyperlipidemia, unspecified: Secondary | ICD-10-CM | POA: Diagnosis not present

## 2021-10-06 DIAGNOSIS — K219 Gastro-esophageal reflux disease without esophagitis: Secondary | ICD-10-CM | POA: Diagnosis not present

## 2021-10-10 DIAGNOSIS — Z79899 Other long term (current) drug therapy: Secondary | ICD-10-CM | POA: Diagnosis not present

## 2021-10-10 DIAGNOSIS — Z5181 Encounter for therapeutic drug level monitoring: Secondary | ICD-10-CM | POA: Diagnosis not present

## 2021-10-14 DIAGNOSIS — M25512 Pain in left shoulder: Secondary | ICD-10-CM | POA: Diagnosis not present

## 2021-10-14 DIAGNOSIS — M75102 Unspecified rotator cuff tear or rupture of left shoulder, not specified as traumatic: Secondary | ICD-10-CM | POA: Diagnosis not present

## 2021-10-14 DIAGNOSIS — M7522 Bicipital tendinitis, left shoulder: Secondary | ICD-10-CM | POA: Diagnosis not present

## 2021-10-14 DIAGNOSIS — M7552 Bursitis of left shoulder: Secondary | ICD-10-CM | POA: Diagnosis not present

## 2021-10-14 DIAGNOSIS — G8929 Other chronic pain: Secondary | ICD-10-CM | POA: Diagnosis not present

## 2021-10-15 ENCOUNTER — Ambulatory Visit: Payer: Medicare Other | Admitting: Podiatry

## 2021-10-16 DIAGNOSIS — H2513 Age-related nuclear cataract, bilateral: Secondary | ICD-10-CM | POA: Diagnosis not present

## 2021-10-16 DIAGNOSIS — M25512 Pain in left shoulder: Secondary | ICD-10-CM | POA: Diagnosis not present

## 2021-10-16 DIAGNOSIS — M75102 Unspecified rotator cuff tear or rupture of left shoulder, not specified as traumatic: Secondary | ICD-10-CM | POA: Diagnosis not present

## 2021-10-16 DIAGNOSIS — H35033 Hypertensive retinopathy, bilateral: Secondary | ICD-10-CM | POA: Diagnosis not present

## 2021-10-16 DIAGNOSIS — Z794 Long term (current) use of insulin: Secondary | ICD-10-CM | POA: Diagnosis not present

## 2021-10-16 DIAGNOSIS — G8929 Other chronic pain: Secondary | ICD-10-CM | POA: Diagnosis not present

## 2021-10-16 DIAGNOSIS — I10 Essential (primary) hypertension: Secondary | ICD-10-CM | POA: Diagnosis not present

## 2021-10-16 DIAGNOSIS — M7522 Bicipital tendinitis, left shoulder: Secondary | ICD-10-CM | POA: Diagnosis not present

## 2021-10-16 DIAGNOSIS — M7552 Bursitis of left shoulder: Secondary | ICD-10-CM | POA: Diagnosis not present

## 2021-10-16 DIAGNOSIS — E109 Type 1 diabetes mellitus without complications: Secondary | ICD-10-CM | POA: Diagnosis not present

## 2021-10-21 DIAGNOSIS — M25512 Pain in left shoulder: Secondary | ICD-10-CM | POA: Diagnosis not present

## 2021-10-21 DIAGNOSIS — M7522 Bicipital tendinitis, left shoulder: Secondary | ICD-10-CM | POA: Diagnosis not present

## 2021-10-21 DIAGNOSIS — M75102 Unspecified rotator cuff tear or rupture of left shoulder, not specified as traumatic: Secondary | ICD-10-CM | POA: Diagnosis not present

## 2021-10-21 DIAGNOSIS — G8929 Other chronic pain: Secondary | ICD-10-CM | POA: Diagnosis not present

## 2021-10-21 DIAGNOSIS — M7552 Bursitis of left shoulder: Secondary | ICD-10-CM | POA: Diagnosis not present

## 2021-10-24 DIAGNOSIS — M25512 Pain in left shoulder: Secondary | ICD-10-CM | POA: Diagnosis not present

## 2021-10-24 DIAGNOSIS — G8929 Other chronic pain: Secondary | ICD-10-CM | POA: Diagnosis not present

## 2021-10-24 DIAGNOSIS — M7552 Bursitis of left shoulder: Secondary | ICD-10-CM | POA: Diagnosis not present

## 2021-10-24 DIAGNOSIS — M75102 Unspecified rotator cuff tear or rupture of left shoulder, not specified as traumatic: Secondary | ICD-10-CM | POA: Diagnosis not present

## 2021-10-24 DIAGNOSIS — M7522 Bicipital tendinitis, left shoulder: Secondary | ICD-10-CM | POA: Diagnosis not present

## 2021-10-27 DIAGNOSIS — M75102 Unspecified rotator cuff tear or rupture of left shoulder, not specified as traumatic: Secondary | ICD-10-CM | POA: Diagnosis not present

## 2021-10-27 DIAGNOSIS — M25512 Pain in left shoulder: Secondary | ICD-10-CM | POA: Diagnosis not present

## 2021-10-27 DIAGNOSIS — M7552 Bursitis of left shoulder: Secondary | ICD-10-CM | POA: Diagnosis not present

## 2021-10-27 DIAGNOSIS — G8929 Other chronic pain: Secondary | ICD-10-CM | POA: Diagnosis not present

## 2021-10-27 DIAGNOSIS — M7522 Bicipital tendinitis, left shoulder: Secondary | ICD-10-CM | POA: Diagnosis not present

## 2021-10-29 DIAGNOSIS — M7522 Bicipital tendinitis, left shoulder: Secondary | ICD-10-CM | POA: Diagnosis not present

## 2021-10-29 DIAGNOSIS — M25512 Pain in left shoulder: Secondary | ICD-10-CM | POA: Diagnosis not present

## 2021-10-29 DIAGNOSIS — G8929 Other chronic pain: Secondary | ICD-10-CM | POA: Diagnosis not present

## 2021-10-29 DIAGNOSIS — M7552 Bursitis of left shoulder: Secondary | ICD-10-CM | POA: Diagnosis not present

## 2021-10-29 DIAGNOSIS — M75102 Unspecified rotator cuff tear or rupture of left shoulder, not specified as traumatic: Secondary | ICD-10-CM | POA: Diagnosis not present

## 2021-11-03 DIAGNOSIS — M25552 Pain in left hip: Secondary | ICD-10-CM | POA: Diagnosis not present

## 2021-11-06 DIAGNOSIS — G8929 Other chronic pain: Secondary | ICD-10-CM | POA: Diagnosis not present

## 2021-11-06 DIAGNOSIS — M75102 Unspecified rotator cuff tear or rupture of left shoulder, not specified as traumatic: Secondary | ICD-10-CM | POA: Diagnosis not present

## 2021-11-06 DIAGNOSIS — M7552 Bursitis of left shoulder: Secondary | ICD-10-CM | POA: Diagnosis not present

## 2021-11-06 DIAGNOSIS — M7522 Bicipital tendinitis, left shoulder: Secondary | ICD-10-CM | POA: Diagnosis not present

## 2021-11-06 DIAGNOSIS — M25512 Pain in left shoulder: Secondary | ICD-10-CM | POA: Diagnosis not present

## 2021-11-07 ENCOUNTER — Encounter: Payer: Self-pay | Admitting: *Deleted

## 2021-11-07 DIAGNOSIS — G8929 Other chronic pain: Secondary | ICD-10-CM | POA: Diagnosis not present

## 2021-11-07 DIAGNOSIS — M7522 Bicipital tendinitis, left shoulder: Secondary | ICD-10-CM | POA: Diagnosis not present

## 2021-11-07 DIAGNOSIS — M75102 Unspecified rotator cuff tear or rupture of left shoulder, not specified as traumatic: Secondary | ICD-10-CM | POA: Diagnosis not present

## 2021-11-07 DIAGNOSIS — Z79899 Other long term (current) drug therapy: Secondary | ICD-10-CM | POA: Diagnosis not present

## 2021-11-07 DIAGNOSIS — M7552 Bursitis of left shoulder: Secondary | ICD-10-CM | POA: Diagnosis not present

## 2021-11-07 DIAGNOSIS — M25512 Pain in left shoulder: Secondary | ICD-10-CM | POA: Diagnosis not present

## 2021-11-12 ENCOUNTER — Ambulatory Visit: Payer: Medicare Other | Admitting: Podiatry

## 2021-11-13 DIAGNOSIS — M7522 Bicipital tendinitis, left shoulder: Secondary | ICD-10-CM | POA: Diagnosis not present

## 2021-11-13 DIAGNOSIS — M25512 Pain in left shoulder: Secondary | ICD-10-CM | POA: Diagnosis not present

## 2021-11-13 DIAGNOSIS — M7552 Bursitis of left shoulder: Secondary | ICD-10-CM | POA: Diagnosis not present

## 2021-11-13 DIAGNOSIS — M75102 Unspecified rotator cuff tear or rupture of left shoulder, not specified as traumatic: Secondary | ICD-10-CM | POA: Diagnosis not present

## 2021-11-13 DIAGNOSIS — G8929 Other chronic pain: Secondary | ICD-10-CM | POA: Diagnosis not present

## 2021-11-21 DIAGNOSIS — Z79899 Other long term (current) drug therapy: Secondary | ICD-10-CM | POA: Diagnosis not present

## 2021-12-15 DIAGNOSIS — M7711 Lateral epicondylitis, right elbow: Secondary | ICD-10-CM | POA: Diagnosis not present

## 2021-12-15 DIAGNOSIS — M25521 Pain in right elbow: Secondary | ICD-10-CM | POA: Diagnosis not present

## 2021-12-17 ENCOUNTER — Ambulatory Visit: Payer: Medicare Other | Admitting: Cardiology

## 2021-12-17 DIAGNOSIS — Z5181 Encounter for therapeutic drug level monitoring: Secondary | ICD-10-CM | POA: Diagnosis not present

## 2021-12-17 DIAGNOSIS — Z79899 Other long term (current) drug therapy: Secondary | ICD-10-CM | POA: Diagnosis not present

## 2021-12-18 ENCOUNTER — Ambulatory Visit: Payer: Medicare Other | Attending: Cardiology | Admitting: Nurse Practitioner

## 2021-12-18 ENCOUNTER — Encounter: Payer: Self-pay | Admitting: Nurse Practitioner

## 2021-12-18 VITALS — BP 128/80 | HR 90 | Ht 67.0 in | Wt 175.4 lb

## 2021-12-18 DIAGNOSIS — I25119 Atherosclerotic heart disease of native coronary artery with unspecified angina pectoris: Secondary | ICD-10-CM | POA: Diagnosis not present

## 2021-12-18 DIAGNOSIS — I10 Essential (primary) hypertension: Secondary | ICD-10-CM | POA: Diagnosis not present

## 2021-12-18 DIAGNOSIS — Z720 Tobacco use: Secondary | ICD-10-CM

## 2021-12-18 DIAGNOSIS — E785 Hyperlipidemia, unspecified: Secondary | ICD-10-CM | POA: Diagnosis not present

## 2021-12-18 MED ORDER — EZETIMIBE 10 MG PO TABS: 10.0000 mg | ORAL_TABLET | Freq: Every day | ORAL | 6 refills | Status: DC

## 2021-12-18 MED ORDER — METOPROLOL TARTRATE 50 MG PO TABS: 50.0000 mg | ORAL_TABLET | Freq: Two times a day (BID) | ORAL | 6 refills | Status: DC

## 2021-12-18 NOTE — Patient Instructions (Signed)
Medication Instructions:  Begin Zetia '10mg'$  daily Increase Metoprolol to '50mg'$  twice a day   Continue all other medications.     Labwork: none  Testing/Procedures: none  Follow-Up: 2-3 months   Any Other Special Instructions Will Be Listed Below (If Applicable). Smoking cessation info given today.   If you need a refill on your cardiac medications before your next appointment, please call your pharmacy.

## 2021-12-18 NOTE — Progress Notes (Signed)
Cardiology Office Note:    Date:  12/18/2021   ID:  Kimberly [name], DOB 10/24/1965, MRN [id number]  PCP:  Neale Burly, MD   [facility] Cardiologist:  Fransico Him, MD     Referring MD: Neale Burly, MD   CC: Here for follow-up  History of Present Illness:    [name] is a 56 y.o. female with a hx of the following:  CAD, s/p CABG x1 (LIMA to LAD) in 2017 HTN HLD COPD Tobacco abuse T2DM Hx of palpitations  Patient is a 56 year old female with past medical history as mentioned above.  Echo in 2022 revealed EF 55 to 60%, no RWMA, mild LVH, grade 1 DD, tricuspid valve was noted as being abnormal, mild TR without evidence of tricuspid stenosis.  No other significant valvular abnormalities noted.  She had an arterial study done previously that did not reveal evidence of lower extremity arterial disease.  She saw Dr. Fransico Him in June 2023 and was reported is doing well.  Noted intermittent gas under left breast and belching.  She was experiencing increased shortness of breath and was following Dr. Melvyn Novas with pulmonary and was referred to ENT.  Denied any chest pain, dizziness, syncope, PND, orthopnea, or lower extremity edema.  NST was arranged and did not reveal any ischemia, EF estimated at 43%, study was considered low risk repeat echo revealed EF 50 to 55%, mild LVH, elevated left atrial pressure, mild MR, mild TR, no other significant valvular abnormalities.  Definity contrast study was arranged for her and revealed EF 50 to 55%, hypokinesis of apical septal segment, apical inferior segment, and apex, Dr. Fransico Him recommended to continue medical management.  Was told to follow-up with Dr. Domenic Polite or Dr. Harl Bowie in [place] office in 6 months.  Today she presents for 25-monthfollow-up visit.  She states she continues to have some chest pain located under her left breast tissue, describes this as aching/pressure, notices this was belching, does not seem to  be positional, and not associated with exertion.  Has not tried any medication.  Not sure what makes it worse, sleeping makes it better.  Continues to be the same over time.  Denies any shortness of breath, palpitations, syncope, presyncope, dizziness, orthopnea, PND, swelling, significant weight changes, acute bleeding, or claudication.  Continues to smoke 5 cigarettes/day.  Used to smoke up to 1 pack/day, very interested and motivated to quit smoking.  BP well-controlled today, however she says SBP yesterday was around 170s.  Denies any other questions or concerns today.   Past Medical History:  Diagnosis Date   Asthma    CAD (coronary artery disease) 04/06/2015   CABG with LIMA-LAD   [place] complication [place]    [place]    Approx 2014 per pt report, no further details available   [facility]var[facility] ([facility]     Past Surgical History:  Procedure Laterality Date   ABDOMINAL HYSTERECTOMY     ovarian cancer   APPENDECTOMY     CARDIAC CATHETERIZATION N/A 04/08/2015   Procedure: Left Heart Cath and Coronary Angiography;  Surgeon: JJettie Booze MD; LAD 95%, single vessel disease    CORONARY ARTERY BYPASS GRAFT N/A 04/11/2015   Procedure: CORONARY ARTERY BYPASS GRAFTING (CABG) x one using left internal mamary artery. ;  Surgeon: PIvin Poot MD;  LIMA-LAD   TEE WITHOUT CARDIOVERSION N/A 04/11/2015   Procedure: TRANSESOPHAGEAL ECHOCARDIOGRAM (TEE);  Surgeon: PIvin Poot MD;  [place] [facility]  Service: Open Heart Surgery;  Laterality: N/A;    Current Medications: Current Meds  Medication Sig   albuterol (PROVENTIL HFA;VENTOLIN HFA) 108 (90 Base) MCG/ACT inhaler Inhale 1 puff into the lungs every 6 (six) hours as needed for wheezing or shortness of breath.   albuterol (PROVENTIL) (2.5 MG/3ML) 0.083% nebulizer solution Take 3 mLs (2.5 mg total) by nebulization every 6 (six) hours as needed for wheezing or shortness of breath.    amphetamine-dextroamphetamine (ADDERALL) 20 MG tablet Take 20 mg by mouth 3 (three) times daily.   aspirin EC 81 MG tablet Take 81 mg by mouth daily. Swallow whole.   Blood Glucose Monitoring Suppl (TRUE METRIX METER) w/Device KIT Use as directed   citalopram (CELEXA) 20 MG tablet Take 20 mg by mouth daily.   cloNIDine (CATAPRES) 0.1 MG tablet Take 0.2 mg by mouth every morning.   desvenlafaxine (PRISTIQ) 100 MG 24 hr tablet Take 100 mg by mouth daily.   diazepam (VALIUM) 5 MG tablet Take 1 tablet by mouth daily.   diclofenac Sodium (VOLTAREN) 1 % GEL Gram(s) Topical 4 Times Daily   FARXIGA 10 MG TABS tablet Take 10 mg by mouth every morning.   glucose blood (TRUE METRIX BLOOD GLUCOSE TEST) test strip Use as instructed   HUMALOG 100 UNIT/ML injection Inject into the skin as directed.   insulin aspart (NOVOLOG FLEXPEN) 100 UNIT/ML FlexPen As per sliding scale.   Insulin Disposable Pump (OMNIPOD 5 G6 INTRO, GEN 5,) KIT by Does not apply route.   LORazepam (ATIVAN) 0.5 MG tablet Take 0.5 mg by mouth daily.    losartan (COZAAR) 100 MG tablet Take 100 mg by mouth daily.   omeprazole (PRILOSEC) 40 MG capsule Take 40 mg by mouth daily.   OZEMPIC, 0.25 OR 0.5 MG/DOSE, 2 MG/3ML SOPN Inject 0.5 mg into the skin once a week.   pregabalin (LYRICA) 50 MG capsule Take 50 mg by mouth 3 (three) times daily.   rosuvastatin (CRESTOR) 40 MG tablet Take 1 tablet (40 mg total) by mouth daily.   tiZANidine (ZANAFLEX) 4 MG tablet Take 4 mg by mouth 2 (two) times daily.   TRUEPLUS LANCETS 28G MISC Use as directed   metoprolol tartrate (LOPRESSOR) 25 MG tablet TAKE 1 TABLET BY MOUTH TWICE A DAY (Patient taking differently: Take 25 mg by mouth daily.)     Allergies:   Atomoxetine, Oxycodone, Oxycodone-acetaminophen, Phenazopyridine, Strattera [atomoxetine hcl], and Pyridium [phenazopyridine hcl]   Social History   Socioeconomic History   Marital status: Married    Spouse name: Not on file   Number of children:  Not on file   Years of education: Not on file   Highest education level: Not on file  Occupational History   Not on file  Tobacco Use   Smoking status: Some Days    Packs/day: 0.50    Years: 42.00    Total pack years: 21.00    Types: Cigarettes    Start date: 09/14/1979   Smokeless tobacco: Never   Tobacco comments:    smokes 6 cigarettes per day --11/30/2019  Vaping Use   Vaping Use: Former   Start date: 09/13/2019   Quit date: 10/02/2020  Substance and Sexual Activity   Alcohol use: Yes    Alcohol/week: 3.0 standard drinks of alcohol    Types: 3 Glasses of wine per week    Comment: occasional   Drug use: No   Sexual activity: Yes    Birth control/protection: None, Surgical    Comment:  hysterectomy  Other Topics Concern   Not on file  Social History Narrative   Not on file   Social Determinants of Health   Financial Resource Strain: Low Risk  (10/17/2020)   Overall Financial Resource Strain (CARDIA)    Difficulty of Paying Living Expenses: Not very hard  Food Insecurity: Food Insecurity Present (10/17/2020)   Hunger Vital Sign    Worried About Running Out of Food in the Last Year: Sometimes true    Ran Out of Food in the Last Year: Sometimes true  Transportation Needs: No Transportation Needs (10/17/2020)   PRAPARE - Hydrologist (Medical): No    Lack of Transportation (Non-Medical): No  Physical Activity: Inactive (10/17/2020)   Exercise Vital Sign    Days of Exercise per Week: 0 days    Minutes of Exercise per Session: 30 min  Stress: No Stress Concern Present (10/17/2020)   [place]    Feeling of Stress : Only a little  Social Connections: Moderately Integrated (10/17/2020)   Social Connection and Isolation Panel [NHANES]    Frequency of Communication with Friends and Family: More than three times a week    Frequency of Social Gatherings with Friends and Family: Twice a  week    Attends Religious Services: 1 to 4 times per year    Active Member of [employer] or Organizations: No    Attends Archivist Meetings: Never    Marital Status: Married     Family History: The patient's family history includes Diabetes in her mother; Heart disease in her maternal grandfather.  ROS:   Review of Systems  Constitutional: Negative.   HENT: Negative.    Eyes: Negative.   Respiratory: Negative.    Cardiovascular:  Positive for chest pain. Negative for palpitations, orthopnea, claudication, leg swelling and PND.       See HPI.  Gastrointestinal: Negative.   Genitourinary: Negative.   Musculoskeletal:  Positive for joint pain. Negative for back pain, falls, myalgias and neck pain.  Skin: Negative.   Neurological: Negative.   Endo/Heme/Allergies: Negative.   Psychiatric/Behavioral: Negative.      Please see the history of present illness.    All other systems reviewed and are negative.  EKGs/Labs/Other Studies Reviewed:    The following studies were reviewed today:   EKG:  EKG is not ordered today.  EKG from June 2023 revealed normal sinus rhythm, 61 bpm, without any acute ischemic changes.  2D echocardiogram on June 25, 2021:  1. Left ventricular ejection fraction, by estimation, is 50 to 55%. The  left ventricle has low normal function. Left ventricular endocardial  border not optimally defined to evaluate regional wall motion. There is  mild left ventricular hypertrophy. Left  ventricular diastolic parameters are indeterminate. Elevated left atrial  pressure.   2. Right ventricular systolic function is normal. The right ventricular  size is normal. Tricuspid regurgitation signal is inadequate for assessing  PA pressure.   3. The mitral valve is abnormal. Mild mitral valve regurgitation. No  evidence of mitral stenosis.   4. The tricuspid valve is abnormal.   5. The aortic valve is tricuspid. Aortic valve regurgitation is not  visualized. No  aortic stenosis is present.   6. The inferior vena cava is normal in size with greater than 50%  respiratory variability, suggesting right atrial pressure of 3 mmHg.   Comparison(s): Echocardiogram done 10/03/20 showed an EF of 55-60%.  Lexiscan on  June 17, 2021:   Findings are consistent with prior myocardial infarction. The study is low risk.   No ST deviation was noted.   Left ventricular function is abnormal. There was a single regional abnormality. Nuclear stress EF: 43 %. The left ventricular ejection fraction is moderately decreased (30-44%). End diastolic cavity size is mildly enlarged. End systolic cavity size is mildly enlarged.   Apical infarct with no ischemia EF estimated at 43%    Left heart cath on April 08, 2015: Ost LAD to Prox LAD lesion, 95% stenosed. Severe tortuosity in the right subclavian made torquing catheters quite difficult. Would not use the right radial in the future for access site for cardiac cath.   Severe ostial LAD stenosis. There is no landing zone for stent. Fixing this percutaneously would require leaving stents in the distal left main and putting a large ramus and circumflex at risk to be jailed. She would also likely have to be on lifelong clopidogrel. I think a better option would be to have a surgical consult for possible LIMA to LAD. She has a good target vessel in the mid to distal LAD.   We'll restart heparin in 6 hours. Transfer to stepdown unit. Obtain echocardiogram. This was discussed with Dr. Claiborne Billings.   Recent Labs: 06/12/2021: ALT 46; BUN 8; Creatinine, Ser 0.88; Potassium 4.5; Sodium 141  Recent Lipid Panel    Component Value Date/Time   CHOL 262 (H) 06/12/2021 0958   TRIG 128 06/12/2021 0958   HDL 60 06/12/2021 0958   CHOLHDL 4.4 06/12/2021 0958   CHOLHDL 4.3 04/06/2015 1816   VLDL 53 (H) 04/06/2015 1816   LDLCALC 179 (H) 06/12/2021 0958     Risk Assessment/Calculations:        The 10-year ASCVD risk score (Arnett DK, et al.,  2019) is: 30.1%   Values used to calculate the score:     Age: 46 years     Sex: Female     Is Non-Hispanic African American: Yes     Diabetic: Yes     Tobacco smoker: Yes     Systolic Blood Pressure: 595 mmHg     Is BP treated: Yes     HDL Cholesterol: 60 mg/dL     Total Cholesterol: 262 mg/dL       Physical Exam:    VS:  BP 128/80   Pulse 90   Ht 5' 7" (1.702 m)   Wt 175 lb 6.4 oz (79.6 kg)   SpO2 98%   BMI 27.47 kg/m     Wt Readings from Last 3 Encounters:  12/18/21 175 lb 6.4 oz (79.6 kg)  06/12/21 189 lb (85.7 kg)  11/25/20 187 lb (84.8 kg)     GEN: Well nourished, well developed 56 year old female in no acute distress HEENT: Normal NECK: No JVD; No carotid bruits CARDIAC: S1/S2, RRR, no murmurs, rubs, gallops; 2+ peripheral pulses throughout, strong and equal bilaterally RESPIRATORY:  Clear and diminished to auscultation with diminished inspiratory wheezing along right posterior lobe otherwise no other wheezing or rhonchi noted; nonproductive, strong cough ABDOMEN: Soft, non-tender, non-distended MUSCULOSKELETAL:  No edema; No deformity  SKIN: Warm and dry NEUROLOGIC:  Alert and oriented x 3 PSYCHIATRIC:  Normal affect   ASSESSMENT:    1. Coronary artery disease involving native heart with angina pectoris, unspecified vessel or lesion type ([place])   2. Hyperlipidemia, unspecified hyperlipidemia type   3. Hypertension, unspecified type   4. Tobacco abuse    PLAN:    In  order of problems listed above:  CAD,  s/p CABG x1 (LIMA to LAD) in 2017 Continues to note atypical chest pain that has not changed in quality since last saw Dr. Olivia Mackie in office.  Myoview in June 2022 was negative for ischemia.  Decided to medically manage at this point.  Will increase metoprolol tartrate to 50 mg twice daily.  Continue aspirin, losartan, and Crestor. Heart healthy diet and regular cardiovascular exercise encouraged.  ED precautions discussed.  At next OV if symptoms are not  relieved, plan to initiate isosorbide mononitrate.  Hyperlipidemia Labs from September 2023 revealed total cholesterol 227, HDL 55, LDL 146, and triglycerides 148.  She is currently not at goal.  Initiate Zetia 10 mg daily.  Continue Crestor 40 mg daily. Heart healthy diet and regular cardiovascular exercise encouraged.  Plan to arrange FLP and LFT to be drawn at next office visit.   3. Hypertension Blood pressure today 120/80.  SBP has not been recently well-controlled at home.  She states yesterday her SBP was around 170s.  Will increase Lopressor as mentioned above.  Continue current medication regimen. Heart healthy diet and regular cardiovascular exercise encouraged.   4. Tobacco abuse Currently smokes 5 cigarettes/day, sometimes more on the weekends.  Smoking cessation encouraged and discussed.   Cannot initiate nicotine patches due to her being on Adderall. Will include smoking cessation resources for her with after visit summary instructions.  5. Disposition: Follow-up with Dr. Domenic Polite in 2 to 3 months or sooner if anything changes.    Medication Adjustments/Labs and Tests Ordered: Current medicines are reviewed at length with the patient today.  Concerns regarding medicines are outlined above.  No orders of the defined types were placed in this encounter.  Meds ordered this encounter  Medications   metoprolol tartrate (LOPRESSOR) 50 MG tablet    Sig: Take 1 tablet (50 mg total) by mouth 2 (two) times daily.    Dispense:  60 tablet    Refill:  6    Dose increased 12/18/2021   ezetimibe (ZETIA) 10 MG tablet    Sig: Take 1 tablet (10 mg total) by mouth daily.    Dispense:  30 tablet    Refill:  6    New 12/18/2021    Patient Instructions  Medication Instructions:  Begin Zetia 74m daily Increase Metoprolol to 51mtwice a day   Continue all other medications.     Labwork: none  Testing/Procedures: none  Follow-Up: 2-3 months   Any Other Special Instructions Will Be  Listed Below (If Applicable). Smoking cessation info given today.   If you need a refill on your cardiac medications before your next appointment, please call your pharmacy.    Signed, ElFinis BudNP  12/18/2021 1:05 PM    [facility]

## 2022-01-26 DIAGNOSIS — E7849 Other hyperlipidemia: Secondary | ICD-10-CM | POA: Diagnosis not present

## 2022-01-26 DIAGNOSIS — I10 Essential (primary) hypertension: Secondary | ICD-10-CM | POA: Diagnosis not present

## 2022-01-26 DIAGNOSIS — L84 Corns and callosities: Secondary | ICD-10-CM | POA: Diagnosis not present

## 2022-01-26 DIAGNOSIS — E1143 Type 2 diabetes mellitus with diabetic autonomic (poly)neuropathy: Secondary | ICD-10-CM | POA: Diagnosis not present

## 2022-01-26 DIAGNOSIS — K219 Gastro-esophageal reflux disease without esophagitis: Secondary | ICD-10-CM | POA: Diagnosis not present

## 2022-02-02 DIAGNOSIS — G5601 Carpal tunnel syndrome, right upper limb: Secondary | ICD-10-CM | POA: Diagnosis not present

## 2022-02-03 DIAGNOSIS — G5601 Carpal tunnel syndrome, right upper limb: Secondary | ICD-10-CM | POA: Diagnosis not present

## 2022-02-11 DIAGNOSIS — Z79899 Other long term (current) drug therapy: Secondary | ICD-10-CM | POA: Diagnosis not present

## 2022-02-11 DIAGNOSIS — Z5181 Encounter for therapeutic drug level monitoring: Secondary | ICD-10-CM | POA: Diagnosis not present

## 2022-02-27 ENCOUNTER — Ambulatory Visit: Payer: 59 | Admitting: Internal Medicine

## 2022-03-02 DIAGNOSIS — M7711 Lateral epicondylitis, right elbow: Secondary | ICD-10-CM | POA: Diagnosis not present

## 2022-03-02 DIAGNOSIS — R200 Anesthesia of skin: Secondary | ICD-10-CM | POA: Diagnosis not present

## 2022-03-02 DIAGNOSIS — M25511 Pain in right shoulder: Secondary | ICD-10-CM | POA: Diagnosis not present

## 2022-03-02 DIAGNOSIS — R202 Paresthesia of skin: Secondary | ICD-10-CM | POA: Diagnosis not present

## 2022-03-09 ENCOUNTER — Encounter: Payer: Self-pay | Admitting: Internal Medicine

## 2022-03-09 ENCOUNTER — Ambulatory Visit: Payer: 59 | Attending: Internal Medicine | Admitting: Internal Medicine

## 2022-03-09 VITALS — BP 135/78 | HR 64 | Ht 67.0 in | Wt 176.8 lb

## 2022-03-09 DIAGNOSIS — I739 Peripheral vascular disease, unspecified: Secondary | ICD-10-CM

## 2022-03-09 MED ORDER — ROSUVASTATIN CALCIUM 10 MG PO TABS: 10.0000 mg | ORAL_TABLET | Freq: Every day | ORAL | 3 refills | Status: DC

## 2022-03-09 MED ORDER — EZETIMIBE 10 MG PO TABS: 10.0000 mg | ORAL_TABLET | Freq: Every day | ORAL | 2 refills | Status: DC

## 2022-03-09 NOTE — Progress Notes (Signed)
Cardiology Office Note  Date: 03/09/2022   ID: Kimberly [name], DOB September 16, 1965, MRN [id number]  PCP:  Neale Burly, MD  Cardiologist:  Fransico Him, MD Electrophysiologist:  None   Reason for Office Visit: Follow-up of CAD   History of Present Illness: Kimberly [name] is a 57 y.o. female known to have CAD s/p one-vessel CABG in 2017 (LIMA to LAD) with low normal LVEF, DM 2, asthma, nicotine abuse presented to cardiology clinic for follow-up visit.  Patient has no symptoms of angina, DOE, lightness, syncope, LE swelling. She does report having cramps in her calf muscles when she walks and has to rest for them to resolve. Currently smokes 5 to 6 cigarettes/day. Takes rosuvastatin 40 mg nightly with diffuse muscle aches. She says she was not taking it daily in the past but lately she has been taking it every day.  She also complained of pain under her left breast which occurs at random times, not related to rest or exertion.  She underwent NM stress test in 06/2021 which showed no evidence of ischemia, prior MI and nuclear LVEF 43%.  Subsequent echocardiogram in 06/2021 showed LVEF 50 to 55%, indeterminate LV diastology, and mild MR.  Past Medical History:  Diagnosis Date   Asthma    CAD (coronary artery disease) 04/06/2015   CABG with LIMA-LAD   Diabetes mellitus without complication ([place])    Myocardial infarction ([place])    Approx 2014 per pt report, no further details available   Ovarian cancer [facility])     Past Surgical History:  Procedure Laterality Date   ABDOMINAL HYSTERECTOMY     ovarian cancer   APPENDECTOMY     CARDIAC CATHETERIZATION N/A 04/08/2015   Procedure: Left Heart Cath and Coronary Angiography;  Surgeon: Jettie Booze, MD; LAD 95%, single vessel disease    CORONARY ARTERY BYPASS GRAFT N/A 04/11/2015   Procedure: CORONARY ARTERY BYPASS GRAFTING (CABG) x one using left internal mamary artery. ;  Surgeon: Ivin Poot, MD;  LIMA-LAD   TEE WITHOUT CARDIOVERSION  N/A 04/11/2015   Procedure: TRANSESOPHAGEAL ECHOCARDIOGRAM (TEE);  Surgeon: Ivin Poot, MD;  Location: [facility];  Service: Open Heart Surgery;  Laterality: N/A;    Current Outpatient Medications  Medication Sig Dispense Refill   albuterol (PROVENTIL HFA;VENTOLIN HFA) 108 (90 Base) MCG/ACT inhaler Inhale 1 puff into the lungs every 6 (six) hours as needed for wheezing or shortness of breath. 54 g 3   albuterol (PROVENTIL) (2.5 MG/3ML) 0.083% nebulizer solution Take 3 mLs (2.5 mg total) by nebulization every 6 (six) hours as needed for wheezing or shortness of breath. 75 mL 12   amphetamine-dextroamphetamine (ADDERALL) 20 MG tablet Take 20 mg by mouth 3 (three) times daily.     aspirin EC 81 MG tablet Take 81 mg by mouth daily. Swallow whole.     Blood Glucose Monitoring Suppl (TRUE METRIX METER) w/Device KIT Use as directed 1 kit 0   celecoxib (CELEBREX) 200 MG capsule Take 200 mg by mouth daily.     citalopram (CELEXA) 20 MG tablet Take 20 mg by mouth daily.     cloNIDine (CATAPRES) 0.1 MG tablet Take 0.2 mg by mouth every morning.     desvenlafaxine (PRISTIQ) 100 MG 24 hr tablet Take 100 mg by mouth daily.     diazepam (VALIUM) 5 MG tablet Take 1 tablet by mouth daily.     diclofenac Sodium (VOLTAREN) 1 % GEL Gram(s) Topical 4 Times Daily     escitalopram (  LEXAPRO) 10 MG tablet Take 1 tablet by mouth daily.     ezetimibe (ZETIA) 10 MG tablet Take 1 tablet (10 mg total) by mouth daily. 30 tablet 6   FARXIGA 10 MG TABS tablet Take 10 mg by mouth every morning.     gabapentin (NEURONTIN) 100 MG capsule Take 100 mg by mouth 3 (three) times daily.     glucose blood (TRUE METRIX BLOOD GLUCOSE TEST) test strip Use as instructed 100 each 12   HUMALOG 100 UNIT/ML injection Inject into the skin as directed.     Insulin Disposable Pump (OMNIPOD 5 G6 INTRO, GEN 5,) KIT by Does not apply route.     lidocaine (LIDODERM) 5 % SMARTSIG:Topical     lisinopril (ZESTRIL) 20 MG tablet Take 20 mg by mouth  daily.     LORazepam (ATIVAN) 0.5 MG tablet Take 0.5 mg by mouth daily.      losartan (COZAAR) 100 MG tablet Take 100 mg by mouth daily.     metoprolol tartrate (LOPRESSOR) 50 MG tablet Take 1 tablet (50 mg total) by mouth 2 (two) times daily. 60 tablet 6   omeprazole (PRILOSEC) 40 MG capsule Take 40 mg by mouth daily.     OZEMPIC, 0.25 OR 0.5 MG/DOSE, 2 MG/3ML SOPN Inject 0.5 mg into the skin once a week.     rosuvastatin (CRESTOR) 40 MG tablet Take 1 tablet (40 mg total) by mouth daily. 90 tablet 2   tiZANidine (ZANAFLEX) 4 MG tablet Take 4 mg by mouth 2 (two) times daily.     TRUEPLUS LANCETS 28G MISC Use as directed 100 each 12   insulin aspart (NOVOLOG FLEXPEN) 100 UNIT/ML FlexPen As per sliding scale. (Patient not taking: Reported on 03/09/2022) 15 mL 11   Melatonin 3 MG TABS Take 1 tablet (3 mg total) by mouth at bedtime as needed. (Patient not taking: Reported on 12/18/2021) 30 tablet 0   pregabalin (LYRICA) 50 MG capsule Take 50 mg by mouth 3 (three) times daily. (Patient not taking: Reported on 03/09/2022)     No current facility-administered medications for this visit.   Allergies:  Atomoxetine, Oxycodone, Oxycodone-acetaminophen, Phenazopyridine, Strattera [atomoxetine hcl], and Pyridium [phenazopyridine hcl]   Social History: The patient  reports that she has been smoking cigarettes. She started smoking about 42 years ago. She has a 21.00 pack-year smoking history. She has never used smokeless tobacco. She reports current alcohol use of about 3.0 standard drinks of alcohol per week. She reports that she does not use drugs.   Family History: The patient's family history includes Diabetes in her mother; Heart disease in her maternal grandfather.   ROS:  Please see the history of present illness. Otherwise, complete review of systems is positive for none.  All other systems are reviewed and negative.   Physical Exam: VS:  BP 135/78   Pulse 64   Ht '5\' 7"'$  (1.702 m)   Wt 176 lb 12.8 oz  (80.2 kg)   SpO2 96%   BMI 27.69 kg/m , BMI Body mass index is 27.69 kg/m.  Wt Readings from Last 3 Encounters:  03/09/22 176 lb 12.8 oz (80.2 kg)  12/18/21 175 lb 6.4 oz (79.6 kg)  06/12/21 189 lb (85.7 kg)    General: Patient appears comfortable at rest. HEENT: Conjunctiva and lids normal, oropharynx clear with moist mucosa. Neck: Supple, no elevated JVP or carotid bruits, no thyromegaly. Lungs: Clear to auscultation, nonlabored breathing at rest. Cardiac: Regular rate and rhythm, no S3 or significant  systolic murmur, no pericardial rub. Abdomen: Soft, nontender, no hepatomegaly, bowel sounds present, no guarding or rebound. Extremities: No pitting edema, distal pulses 2+. Skin: Warm and dry. Musculoskeletal: No kyphosis. Neuropsychiatric: Alert and oriented x3, affect grossly appropriate.  ECG:  An ECG dated 06/2021 was personally reviewed today and demonstrated:  Normal sinus rhythm  Recent Labwork: 06/12/2021: ALT 46; AST 34; BUN 8; Creatinine, Ser 0.88; Potassium 4.5; Sodium 141     Component Value Date/Time   CHOL 262 (H) 06/12/2021 0958   TRIG 128 06/12/2021 0958   HDL 60 06/12/2021 0958   CHOLHDL 4.4 06/12/2021 0958   CHOLHDL 4.3 04/06/2015 1816   VLDL 53 (H) 04/06/2015 1816   LDLCALC 179 (H) 06/12/2021 0958    Other Studies Reviewed Today: Echocardiogram from 06/2021 LVEF 50 to 55% Indeterminant diastology Mild MR  NM stress testing 06/2021 No evidence of ischemia Prior MI Nuclear LVEF 43%  Assessment and Plan: Patient is a 57 year old M known to have CAD s/p one-vessel CABG in 2017 (LIMA to LAD) with low normal LVEF, DM 2, asthma, nicotine abuse presented to cardiology clinic for follow-up visit.  # Noncardiac chest pain -Patient has pain under her left breast, occurs at random times, no relation with rest or exercise. This pain does not occur during exertion.  She underwent NM stress test in 06/2021 which showed notice of ischemia but nuclear LVEF was 43%.   Subsequent echocardiogram showed LVEF 50 to 55% with mild MR.  No further cardiac testing is indicated at this time.  # CAD s/p one-vessel CABG in 2017 (LIMA to LAD) with low normal LVEF, currently free -Continue aspirin 81 mg once daily -Lower the dose of rosuvastatin from 40 mg to 10 mg nightly and add Zetia 10 mg once daily -Continue metoprolol tartrate 50 mg twice a day -Continue lisinopril 20 mg once a day -Continue Farxiga 10 mg once daily -ER precautions for chest pain  # HLD, currently not at goal -Decrease the dose of rosuvastatin from 40 mg to 10 mg nightly due to myalgias and add Zetia 10 mg once daily.  Obtain lipid panel in 3 months.  Goal LDL less than 70.  # Lower EXTR decortication, rule out PAD -Obtain ABI with USG arterial Doppler lower extremities  # Nicotine abuse Plan smoking cessation counseling provided, to cut down cigarettes. Smoking cessation instruction/counseling given:  counseled patient on the dangers of tobacco use, advised patient to stop smoking, and reviewed strategies to maximize success   I have spent a total of 33 minutes with patient reviewing chart, EKGs, labs and examining patient as well as establishing an assessment and plan that was discussed with the patient.  > 50% of time was spent in direct patient care.     Medication Adjustments/Labs and Tests Ordered: Current medicines are reviewed at length with the patient today.  Concerns regarding medicines are outlined above.   Tests Ordered: No orders of the defined types were placed in this encounter.   Medication Changes: No orders of the defined types were placed in this encounter.   Disposition:  Follow up  6 months  Signed Javaria Knapke Fidel Levy, MD, 03/09/2022 10:44 AM    [facility] [place], [place], [place] [address]

## 2022-03-09 NOTE — Patient Instructions (Addendum)
Medication Instructions:  Your physician has recommended you make the following change in your medication:  Decrease rosuvastatin to 10 mg daily Restart zetia 10 mg daily Continue other medications the same  Labwork: none  Testing/Procedures: Your physician has requested that you have an ankle brachial index (ABI). During this test an ultrasound and blood pressure cuff are used to evaluate the arteries that supply the arms and legs with blood. Allow thirty minutes for this exam. There are no restrictions or special instructions.  Follow-Up: Your physician recommends that you schedule a follow-up appointment in: 6 months  Any Other Special Instructions Will Be Listed Below (If Applicable).  If you need a refill on your cardiac medications before your next appointment, please call your pharmacy.

## 2022-03-11 DIAGNOSIS — Z79899 Other long term (current) drug therapy: Secondary | ICD-10-CM | POA: Diagnosis not present

## 2022-04-09 DIAGNOSIS — G5601 Carpal tunnel syndrome, right upper limb: Secondary | ICD-10-CM | POA: Diagnosis not present

## 2022-04-09 DIAGNOSIS — L84 Corns and callosities: Secondary | ICD-10-CM | POA: Diagnosis not present

## 2022-04-09 DIAGNOSIS — Z0000 Encounter for general adult medical examination without abnormal findings: Secondary | ICD-10-CM | POA: Diagnosis not present

## 2022-04-09 DIAGNOSIS — K219 Gastro-esophageal reflux disease without esophagitis: Secondary | ICD-10-CM | POA: Diagnosis not present

## 2022-04-09 DIAGNOSIS — I10 Essential (primary) hypertension: Secondary | ICD-10-CM | POA: Diagnosis not present

## 2022-04-09 DIAGNOSIS — E785 Hyperlipidemia, unspecified: Secondary | ICD-10-CM | POA: Diagnosis not present

## 2022-04-09 DIAGNOSIS — E1143 Type 2 diabetes mellitus with diabetic autonomic (poly)neuropathy: Secondary | ICD-10-CM | POA: Diagnosis not present

## 2022-04-13 ENCOUNTER — Encounter: Payer: Self-pay | Admitting: *Deleted

## 2022-04-14 ENCOUNTER — Encounter (INDEPENDENT_AMBULATORY_CARE_PROVIDER_SITE_OTHER): Payer: Self-pay | Admitting: *Deleted

## 2022-04-17 ENCOUNTER — Telehealth: Payer: Self-pay | Admitting: Internal Medicine

## 2022-04-17 NOTE — Telephone Encounter (Signed)
Patient is calling and said that orthopedic doctor told her to give [place] a call in regards to see if she can take some anti-inflammatory medication due to her having heart disease. Please call back to discuss

## 2022-04-17 NOTE — Telephone Encounter (Signed)
Patient made aware. States that Dr. Polly Cobia gave her a previous prescription for ibuprofen 800 mg and wanted to see if it would be ok to take one since tylenol does not do anything for her. Also states that Dr. Polly Cobia prescribed her gabapentin for nerve pain but she read where patients with heart issues should bot be on gabapentin. Please advise

## 2022-04-17 NOTE — Telephone Encounter (Signed)
Patient states that she called her orthopedic doctor regarding the pain in her shoulders and what she can take for the pain that would be safe for her heart condition. States that she was told that she would need to contact her cardiologist to see what anti inflammatory medications would be safe being that she has heart disease. States that she would like for someone to reach back out to her today and if it is after hours, to please send a MyChart message. Please advise.

## 2022-04-28 DIAGNOSIS — M47812 Spondylosis without myelopathy or radiculopathy, cervical region: Secondary | ICD-10-CM | POA: Diagnosis not present

## 2022-04-28 DIAGNOSIS — Z79891 Long term (current) use of opiate analgesic: Secondary | ICD-10-CM | POA: Diagnosis not present

## 2022-04-28 DIAGNOSIS — M2550 Pain in unspecified joint: Secondary | ICD-10-CM | POA: Diagnosis not present

## 2022-04-28 DIAGNOSIS — G8929 Other chronic pain: Secondary | ICD-10-CM | POA: Diagnosis not present

## 2022-04-28 DIAGNOSIS — M5412 Radiculopathy, cervical region: Secondary | ICD-10-CM | POA: Diagnosis not present

## 2022-04-28 DIAGNOSIS — M25511 Pain in right shoulder: Secondary | ICD-10-CM | POA: Diagnosis not present

## 2022-05-04 ENCOUNTER — Encounter: Payer: 59 | Admitting: Neurology

## 2022-05-06 DIAGNOSIS — Z79899 Other long term (current) drug therapy: Secondary | ICD-10-CM | POA: Diagnosis not present

## 2022-05-07 ENCOUNTER — Encounter: Payer: Self-pay | Admitting: Internal Medicine

## 2022-05-12 DIAGNOSIS — K219 Gastro-esophageal reflux disease without esophagitis: Secondary | ICD-10-CM | POA: Diagnosis not present

## 2022-05-12 DIAGNOSIS — E785 Hyperlipidemia, unspecified: Secondary | ICD-10-CM | POA: Diagnosis not present

## 2022-05-12 DIAGNOSIS — I10 Essential (primary) hypertension: Secondary | ICD-10-CM | POA: Diagnosis not present

## 2022-05-12 DIAGNOSIS — E1143 Type 2 diabetes mellitus with diabetic autonomic (poly)neuropathy: Secondary | ICD-10-CM | POA: Diagnosis not present

## 2022-05-25 ENCOUNTER — Ambulatory Visit (INDEPENDENT_AMBULATORY_CARE_PROVIDER_SITE_OTHER): Payer: 59 | Admitting: Neurology

## 2022-05-25 ENCOUNTER — Other Ambulatory Visit: Payer: Self-pay

## 2022-05-25 DIAGNOSIS — R202 Paresthesia of skin: Secondary | ICD-10-CM

## 2022-05-25 DIAGNOSIS — G5603 Carpal tunnel syndrome, bilateral upper limbs: Secondary | ICD-10-CM

## 2022-05-25 NOTE — Procedures (Signed)
  [facility]  [address] [place], [address]  [place], [place] [address] Tel: [phone] Fax: [phone] Test Date:  05/25/2022  Patient: Kimberly [name] DOB: 1965/11/11 Physician: Jacquelyne Balint, MD  Sex: Female Height: 5\' 7"  Ref Phys: Ricki Miller, MD  ID#: [id number]   Technician:    History: This is a 57 year old female with numbness and tingling of the right arm.  NCV & EMG Findings: Evaluation of the right upper limb was limited to nerve conduction studies due to patient declining needle examination (needle phobia). The nerve conduction studies show: Bilateral median sensory responses show prolonged distal peak latency (L3.9, R4.4 ms) and reduced amplitude (L11, R12 V). Right ulnar and radial sensory responses are within normal limits. Bilateral median (APB) and right ulnar (ADM) motor responses are within normal limits.  Impression: This is an abnormal study. As mentioned above, the study was limited to nerve conductions studies as patient declined needle examination due to fear of needles. The limited findings are consistent with the following: Bilateral median mononeuropathy at or distal to the wrist, consistent with carpal tunnel syndrome, mild in degree electrically, right worse than left. Screening studies for right ulnar or radial mononeuropathies are normal.    ___________________________ Jacquelyne Balint, MD    Nerve Conduction Studies Motor Nerve Results    Latency Amplitude F-Lat Segment Distance CV Comment  Site (ms) Norm (mV) Norm (ms)  (cm) (m/s) Norm   Left Median (APB) Motor  Wrist 3.0  < 4.0 9.9  > 6.0        Right Median (APB) Motor  Wrist 3.4  < 4.0 10.9  > 6.0        Elbow 8.9 - 9.7 -  Elbow-Wrist 29.5 54  > 50   Right Ulnar (ADM) Motor  Wrist 1.98  < 3.1 9.1  > 7.0        Bel elbow 6.6 - 7.9 -  Bel elbow-Wrist 24 52  > 50   Ab elbow 8.6 - 7.8 -  Ab elbow-Bel elbow 10 50 -    Sensory Sites    Neg Peak Lat Amplitude (O-P) Segment Distance  Velocity Comment  Site (ms) Norm (V) Norm  (cm) (ms)   Left Median Sensory  Wrist *3.9  < 3.6 *11  > 15 Wrist-Dig II 13    Right Median Sensory  Wrist-Dig II *4.4  < 3.6 *12  > 15 Wrist-Dig II 13    Right Radial Sensory  Forearm-Wrist 1.95  < 2.7 31  > 14 Forearm-Wrist 10    Right Ulnar Sensory  Wrist-Dig V 2.7  < 3.1 10  > 10 Wrist-Dig V 11        Waveforms:  Motor        Sensory

## 2022-05-26 DIAGNOSIS — M47812 Spondylosis without myelopathy or radiculopathy, cervical region: Secondary | ICD-10-CM | POA: Diagnosis not present

## 2022-05-26 DIAGNOSIS — G5601 Carpal tunnel syndrome, right upper limb: Secondary | ICD-10-CM | POA: Diagnosis not present

## 2022-05-26 DIAGNOSIS — M2550 Pain in unspecified joint: Secondary | ICD-10-CM | POA: Diagnosis not present

## 2022-06-01 DIAGNOSIS — M25512 Pain in left shoulder: Secondary | ICD-10-CM | POA: Diagnosis not present

## 2022-06-01 DIAGNOSIS — R202 Paresthesia of skin: Secondary | ICD-10-CM | POA: Diagnosis not present

## 2022-06-01 DIAGNOSIS — G8929 Other chronic pain: Secondary | ICD-10-CM | POA: Diagnosis not present

## 2022-06-01 DIAGNOSIS — M25562 Pain in left knee: Secondary | ICD-10-CM | POA: Diagnosis not present

## 2022-06-01 DIAGNOSIS — R200 Anesthesia of skin: Secondary | ICD-10-CM | POA: Diagnosis not present

## 2022-06-09 DIAGNOSIS — M19012 Primary osteoarthritis, left shoulder: Secondary | ICD-10-CM | POA: Diagnosis not present

## 2022-06-09 DIAGNOSIS — M25512 Pain in left shoulder: Secondary | ICD-10-CM | POA: Diagnosis not present

## 2022-06-16 ENCOUNTER — Encounter (INDEPENDENT_AMBULATORY_CARE_PROVIDER_SITE_OTHER): Payer: Self-pay | Admitting: *Deleted

## 2022-06-17 DIAGNOSIS — Z79899 Other long term (current) drug therapy: Secondary | ICD-10-CM | POA: Diagnosis not present

## 2022-06-29 DIAGNOSIS — M2550 Pain in unspecified joint: Secondary | ICD-10-CM | POA: Diagnosis not present

## 2022-06-29 DIAGNOSIS — M47812 Spondylosis without myelopathy or radiculopathy, cervical region: Secondary | ICD-10-CM | POA: Diagnosis not present

## 2022-06-29 DIAGNOSIS — M5412 Radiculopathy, cervical region: Secondary | ICD-10-CM | POA: Diagnosis not present

## 2022-07-18 ENCOUNTER — Other Ambulatory Visit: Payer: Self-pay | Admitting: Nurse Practitioner

## 2022-07-30 DIAGNOSIS — M25512 Pain in left shoulder: Secondary | ICD-10-CM | POA: Diagnosis not present

## 2022-07-30 DIAGNOSIS — G8929 Other chronic pain: Secondary | ICD-10-CM | POA: Diagnosis not present

## 2022-08-12 DIAGNOSIS — Z79899 Other long term (current) drug therapy: Secondary | ICD-10-CM | POA: Diagnosis not present

## 2022-08-24 DIAGNOSIS — G5601 Carpal tunnel syndrome, right upper limb: Secondary | ICD-10-CM | POA: Diagnosis not present

## 2022-08-24 DIAGNOSIS — M47812 Spondylosis without myelopathy or radiculopathy, cervical region: Secondary | ICD-10-CM | POA: Diagnosis not present

## 2022-08-31 DIAGNOSIS — Z0000 Encounter for general adult medical examination without abnormal findings: Secondary | ICD-10-CM | POA: Diagnosis not present

## 2022-08-31 DIAGNOSIS — N182 Chronic kidney disease, stage 2 (mild): Secondary | ICD-10-CM | POA: Diagnosis not present

## 2022-08-31 DIAGNOSIS — I10 Essential (primary) hypertension: Secondary | ICD-10-CM | POA: Diagnosis not present

## 2022-08-31 DIAGNOSIS — E785 Hyperlipidemia, unspecified: Secondary | ICD-10-CM | POA: Diagnosis not present

## 2022-08-31 DIAGNOSIS — E1143 Type 2 diabetes mellitus with diabetic autonomic (poly)neuropathy: Secondary | ICD-10-CM | POA: Diagnosis not present

## 2022-08-31 DIAGNOSIS — G5601 Carpal tunnel syndrome, right upper limb: Secondary | ICD-10-CM | POA: Diagnosis not present

## 2022-08-31 DIAGNOSIS — K219 Gastro-esophageal reflux disease without esophagitis: Secondary | ICD-10-CM | POA: Diagnosis not present

## 2022-08-31 DIAGNOSIS — L84 Corns and callosities: Secondary | ICD-10-CM | POA: Diagnosis not present

## 2022-09-03 DIAGNOSIS — I10 Essential (primary) hypertension: Secondary | ICD-10-CM | POA: Diagnosis not present

## 2022-09-07 ENCOUNTER — Encounter: Payer: Self-pay | Admitting: Internal Medicine

## 2022-09-07 ENCOUNTER — Ambulatory Visit: Payer: 59 | Attending: Internal Medicine | Admitting: Internal Medicine

## 2022-09-07 VITALS — BP 130/80 | HR 72 | Ht 66.0 in | Wt 193.8 lb

## 2022-09-07 DIAGNOSIS — R002 Palpitations: Secondary | ICD-10-CM | POA: Diagnosis not present

## 2022-09-07 DIAGNOSIS — R42 Dizziness and giddiness: Secondary | ICD-10-CM | POA: Diagnosis not present

## 2022-09-07 MED ORDER — ROSUVASTATIN CALCIUM 40 MG PO TABS: 40.0000 mg | ORAL_TABLET | Freq: Every day | ORAL | 1 refills | Status: DC

## 2022-09-07 MED ORDER — MECLIZINE HCL 50 MG PO TABS: 25.0000 mg | ORAL_TABLET | Freq: Three times a day (TID) | ORAL | 2 refills | Status: DC | PRN

## 2022-09-07 NOTE — Patient Instructions (Signed)
Medication Instructions:  Your physician has recommended you make the following change in your medication:  Increase Rosuvastatin from 10 mg to 40 mg at daily at bedtime Start taking Meclizine 25 mg three times a daily as needed Continue taking all other medications as prescibed  Labwork: Have Lipids completed at PCP in 3 months  Testing/Procedures: None  Follow-Up: Your physician recommends that you schedule a follow-up appointment in: 6 months  Any Other Special Instructions Will Be Listed Below (If Applicable).  If you need a refill on your cardiac medications before your next appointment, please call your pharmacy.

## 2022-09-07 NOTE — Progress Notes (Signed)
Cardiology Office Note  Date: 09/07/2022   ID: Kimberly [name], DOB 08/10/1965, MRN [id number]  PCP:  Toma Deiters, MD  Cardiologist:  Marjo Bicker, MD Electrophysiologist:  None    History of Present Illness: Kimberly [name] is a 57 y.o. female known to have CAD s/p one-vessel CABG in 2017 (LIMA to LAD) with low normal LVEF, DM 2, history of left eye stroke, asthma, nicotine abuse, HLD presented to cardiology clinic for follow-up visit.  NM stress test in 06/2021 showed no evidence of ischemia, prior MI and nuclear LVEF 43%.  Subsequent echocardiogram in 06/2021 showed LVEF 50 to 55%, indeterminate LV diastology, and mild MR.  Patient reports having dizziness especially in the left eye and thinks this could be due to history of stroke in the left eye.  No angina, orthopnea, PND, leg swelling. Has some DOE when she walks in living room to bedroom but has no DOE with other activities as seen walking from exam room to her car.  She thinks this could be from asthma as well.  No syncope. Continues to smoke cigarettes here and there and switched to vaping recently.  She gets burning and cramps in her legs when she walks. Currently on pregabalin. No evidence of poor circulation to lower extremities per ABI report. Rosuvastatin dose was decreased in the last clinic visit with no improvement in myalgias. This could probably be secondary to neuropathy.  Past Medical History:  Diagnosis Date   Asthma    CAD (coronary artery disease) 04/06/2015   CABG with LIMA-LAD   Diabetes mellitus without complication ([facility])    Myocardial infarction ([facility])    Approx 2014 per pt report, no further details available   Ovarian cancer [facility])     Past Surgical History:  Procedure Laterality Date   ABDOMINAL HYSTERECTOMY     ovarian cancer   APPENDECTOMY     CARDIAC CATHETERIZATION N/A 04/08/2015   Procedure: Left Heart Cath and Coronary Angiography;  Surgeon: Corky Crafts, MD; LAD 95%, single  vessel disease    CORONARY ARTERY BYPASS GRAFT N/A 04/11/2015   Procedure: CORONARY ARTERY BYPASS GRAFTING (CABG) x one using left internal mamary artery. ;  Surgeon: Kerin Perna, MD;  LIMA-LAD   TEE WITHOUT CARDIOVERSION N/A 04/11/2015   Procedure: TRANSESOPHAGEAL ECHOCARDIOGRAM (TEE);  Surgeon: Kerin Perna, MD;  Location: [facility] OR;  Service: Open Heart Surgery;  Laterality: N/A;    Current Outpatient Medications  Medication Sig Dispense Refill   albuterol (PROVENTIL HFA;VENTOLIN HFA) 108 (90 Base) MCG/ACT inhaler Inhale 1 puff into the lungs every 6 (six) hours as needed for wheezing or shortness of breath. 54 g 3   albuterol (PROVENTIL) (2.5 MG/3ML) 0.083% nebulizer solution Take 3 mLs (2.5 mg total) by nebulization every 6 (six) hours as needed for wheezing or shortness of breath. 75 mL 12   amphetamine-dextroamphetamine (ADDERALL) 20 MG tablet Take 20 mg by mouth 3 (three) times daily.     aspirin EC 81 MG tablet Take 81 mg by mouth daily. Swallow whole.     Blood Glucose Monitoring Suppl (TRUE METRIX METER) w/Device KIT Use as directed 1 kit 0   celecoxib (CELEBREX) 200 MG capsule Take 200 mg by mouth daily.     citalopram (CELEXA) 20 MG tablet Take 20 mg by mouth daily.     cloNIDine (CATAPRES) 0.1 MG tablet Take 0.2 mg by mouth every morning.     desvenlafaxine (PRISTIQ) 100 MG 24 hr tablet Take 100 mg  by mouth daily.     diazepam (VALIUM) 5 MG tablet Take 1 tablet by mouth daily.     diclofenac Sodium (VOLTAREN) 1 % GEL Gram(s) Topical 4 Times Daily     escitalopram (LEXAPRO) 10 MG tablet Take 1 tablet by mouth daily.     ezetimibe (ZETIA) 10 MG tablet Take 1 tablet (10 mg total) by mouth daily. 90 tablet 2   FARXIGA 10 MG TABS tablet Take 10 mg by mouth every morning.     gabapentin (NEURONTIN) 100 MG capsule Take 100 mg by mouth 3 (three) times daily.     glucose blood (TRUE METRIX BLOOD GLUCOSE TEST) test strip Use as instructed 100 each 12   HUMALOG 100 UNIT/ML injection  Inject into the skin as directed.     Insulin Disposable Pump (OMNIPOD 5 G6 INTRO, GEN 5,) KIT by Does not apply route.     lidocaine (LIDODERM) 5 % SMARTSIG:Topical     lisinopril (ZESTRIL) 20 MG tablet Take 20 mg by mouth daily.     LORazepam (ATIVAN) 0.5 MG tablet Take 0.5 mg by mouth daily.      metoprolol tartrate (LOPRESSOR) 50 MG tablet TAKE 1 TABLET BY MOUTH TWICE A DAY 180 tablet 1   omeprazole (PRILOSEC) 40 MG capsule Take 40 mg by mouth daily.     OZEMPIC, 0.25 OR 0.5 MG/DOSE, 2 MG/3ML SOPN Inject 0.5 mg into the skin once a week.     rosuvastatin (CRESTOR) 10 MG tablet Take 1 tablet (10 mg total) by mouth daily. 90 tablet 3   tiZANidine (ZANAFLEX) 4 MG tablet Take 4 mg by mouth 2 (two) times daily.     TRUEPLUS LANCETS 28G MISC Use as directed 100 each 12   No current facility-administered medications for this visit.   Allergies:  Atomoxetine, Oxycodone, Oxycodone-acetaminophen, Phenazopyridine, Strattera [atomoxetine hcl], and Pyridium [phenazopyridine hcl]   Social History: The patient  reports that she has been smoking cigarettes. She started smoking about 43 years ago. She has a 21.5 pack-year smoking history. She has never used smokeless tobacco. She reports current alcohol use of about 3.0 standard drinks of alcohol per week. She reports that she does not use drugs.   Family History: The patient's family history includes Diabetes in her mother; Heart disease in her maternal grandfather.   ROS:  Please see the history of present illness. Otherwise, complete review of systems is positive for none.  All other systems are reviewed and negative.   Physical Exam: VS:  There were no vitals taken for this visit., BMI There is no height or weight on file to calculate BMI.  Wt Readings from Last 3 Encounters:  03/09/22 176 lb 12.8 oz (80.2 kg)  12/18/21 175 lb 6.4 oz (79.6 kg)  06/12/21 189 lb (85.7 kg)    General: Patient appears comfortable at rest. HEENT: Conjunctiva and  lids normal, oropharynx clear with moist mucosa. Neck: Supple, no elevated JVP or carotid bruits, no thyromegaly. Lungs: Clear to auscultation, nonlabored breathing at rest. Cardiac: Regular rate and rhythm, no S3 or significant systolic murmur, no pericardial rub. Abdomen: Soft, nontender, no hepatomegaly, bowel sounds present, no guarding or rebound. Extremities: No pitting edema, distal pulses 2+. Skin: Warm and dry. Musculoskeletal: No kyphosis. Neuropsychiatric: Alert and oriented x3, affect grossly appropriate.  Recent Labwork: No results found for requested labs within last 365 days.     Component Value Date/Time   CHOL 262 (H) 06/12/2021 0958   TRIG 128 06/12/2021 7829  HDL 60 06/12/2021 0958   CHOLHDL 4.4 06/12/2021 0958   CHOLHDL 4.3 04/06/2015 1816   VLDL 53 (H) 04/06/2015 1816   LDLCALC 179 (H) 06/12/2021 0958    Other Studies Reviewed Today: Echocardiogram from 06/2021 LVEF 50 to 55% Indeterminant diastology Mild MR  NM stress testing 06/2021 No evidence of ischemia Prior MI Nuclear LVEF 43%  Assessment and Plan: Patient is a 57 year old M known to have CAD s/p one-vessel CABG in 2017 (LIMA to LAD) with low normal LVEF, DM 2, asthma, nicotine abuse presented to cardiology clinic for follow-up visit.   # CAD s/p one-vessel CABG in 2017 (LIMA to LAD) with low normal LVEF, angina free -Continue aspirin 20 mg once daily, increase rosuvastatin dose from 10 mg to 40 mg nightly (manage this did not improve with the lower dose and think this could be from neuropathy) and Zetia 10 mg once daily.  Continue metoprolol tartrate 50 mg twice daily, lisinopril 20 mg once daily, Farxiga 10 mg once daily.  ER precautions for chest pain provided.  # Dizziness likely from left eye stroke, rule out vertigo -Patient gets dizzy only in her left eye likely from history of left stroke.  Rule out vertigo.  Start meclizine 25 mg TID PRN.  EKG showed sinus bradycardia with HR 51 bpm.  No  indication of event monitor at this time.   # HLD, currently not at goal -Lipid panel reviewed from 03/2022.  LDL 149 and TG 213.  Rosuvastatin dose was decreased from 40 mg to 10 mg previously due to myalgias but this did not improve with the decreased dose.  Increase rosuvastatin dose from 10 mg to 40 mg nightly and continue Zetia 10 mg once daily.  Obtain lipid panel in 3 months from her PCP.  She is instructed to call the clinic after blood work is completed in 3 months for lipid panel.  Goal LDL less than 70.  She is agreeable to start PCSK9 inhibitors or glycerin if she does not reach LDL goal with increase to rosuvastatin dose.  # HTN, controlled -Follows with PCP, continue current antihypertensives.  # Nicotine abuse Plan smoking cessation counseling provided, to cut down cigarettes. Smoking cessation instruction/counseling given:  counseled patient on the dangers of tobacco use, advised patient to stop smoking, and reviewed strategies to maximize success     Medication Adjustments/Labs and Tests Ordered: Current medicines are reviewed at length with the patient today.  Concerns regarding medicines are outlined above.   Tests Ordered: No orders of the defined types were placed in this encounter.   Medication Changes: No orders of the defined types were placed in this encounter.   Disposition:  Follow up  6 months  Signed Kennesha Brewbaker Verne Spurr, MD, 09/07/2022 9:40 AM    [facility] at [facility] [address] [place], [place], [place] [address]

## 2022-09-17 DIAGNOSIS — Z1231 Encounter for screening mammogram for malignant neoplasm of breast: Secondary | ICD-10-CM | POA: Diagnosis not present

## 2022-09-21 ENCOUNTER — Other Ambulatory Visit: Payer: Self-pay | Admitting: Internal Medicine

## 2022-10-14 DIAGNOSIS — M65332 Trigger finger, left middle finger: Secondary | ICD-10-CM | POA: Diagnosis not present

## 2022-10-14 DIAGNOSIS — M79641 Pain in right hand: Secondary | ICD-10-CM | POA: Diagnosis not present

## 2022-10-14 DIAGNOSIS — M79642 Pain in left hand: Secondary | ICD-10-CM | POA: Diagnosis not present

## 2022-10-14 DIAGNOSIS — M25511 Pain in right shoulder: Secondary | ICD-10-CM | POA: Diagnosis not present

## 2022-11-02 DIAGNOSIS — M79641 Pain in right hand: Secondary | ICD-10-CM | POA: Diagnosis not present

## 2022-11-09 DIAGNOSIS — M79641 Pain in right hand: Secondary | ICD-10-CM | POA: Diagnosis not present

## 2022-11-09 DIAGNOSIS — R29898 Other symptoms and signs involving the musculoskeletal system: Secondary | ICD-10-CM | POA: Diagnosis not present

## 2022-11-11 DIAGNOSIS — E119 Type 2 diabetes mellitus without complications: Secondary | ICD-10-CM | POA: Diagnosis not present

## 2022-11-11 DIAGNOSIS — I10 Essential (primary) hypertension: Secondary | ICD-10-CM | POA: Diagnosis not present

## 2022-11-11 DIAGNOSIS — H35033 Hypertensive retinopathy, bilateral: Secondary | ICD-10-CM | POA: Diagnosis not present

## 2022-11-11 DIAGNOSIS — H2513 Age-related nuclear cataract, bilateral: Secondary | ICD-10-CM | POA: Diagnosis not present

## 2022-11-16 DIAGNOSIS — M65332 Trigger finger, left middle finger: Secondary | ICD-10-CM | POA: Diagnosis not present

## 2022-11-16 DIAGNOSIS — M79642 Pain in left hand: Secondary | ICD-10-CM | POA: Diagnosis not present

## 2022-11-16 DIAGNOSIS — M25511 Pain in right shoulder: Secondary | ICD-10-CM | POA: Diagnosis not present

## 2022-11-16 DIAGNOSIS — Z01818 Encounter for other preprocedural examination: Secondary | ICD-10-CM | POA: Diagnosis not present

## 2022-11-16 DIAGNOSIS — M75101 Unspecified rotator cuff tear or rupture of right shoulder, not specified as traumatic: Secondary | ICD-10-CM | POA: Diagnosis not present

## 2022-11-17 DIAGNOSIS — M810 Age-related osteoporosis without current pathological fracture: Secondary | ICD-10-CM | POA: Diagnosis not present

## 2022-11-24 DIAGNOSIS — M47812 Spondylosis without myelopathy or radiculopathy, cervical region: Secondary | ICD-10-CM | POA: Diagnosis not present

## 2022-11-24 DIAGNOSIS — M2550 Pain in unspecified joint: Secondary | ICD-10-CM | POA: Diagnosis not present

## 2022-11-24 DIAGNOSIS — M5412 Radiculopathy, cervical region: Secondary | ICD-10-CM | POA: Diagnosis not present

## 2022-11-25 DIAGNOSIS — E785 Hyperlipidemia, unspecified: Secondary | ICD-10-CM | POA: Diagnosis not present

## 2022-11-25 DIAGNOSIS — I129 Hypertensive chronic kidney disease with stage 1 through stage 4 chronic kidney disease, or unspecified chronic kidney disease: Secondary | ICD-10-CM | POA: Diagnosis not present

## 2022-11-25 DIAGNOSIS — E1140 Type 2 diabetes mellitus with diabetic neuropathy, unspecified: Secondary | ICD-10-CM | POA: Diagnosis not present

## 2022-11-25 DIAGNOSIS — Z7982 Long term (current) use of aspirin: Secondary | ICD-10-CM | POA: Diagnosis not present

## 2022-11-25 DIAGNOSIS — M65332 Trigger finger, left middle finger: Secondary | ICD-10-CM | POA: Diagnosis not present

## 2022-11-25 DIAGNOSIS — Z791 Long term (current) use of non-steroidal anti-inflammatories (NSAID): Secondary | ICD-10-CM | POA: Diagnosis not present

## 2022-11-25 DIAGNOSIS — Z7985 Long-term (current) use of injectable non-insulin antidiabetic drugs: Secondary | ICD-10-CM | POA: Diagnosis not present

## 2022-11-25 DIAGNOSIS — M47817 Spondylosis without myelopathy or radiculopathy, lumbosacral region: Secondary | ICD-10-CM | POA: Diagnosis not present

## 2022-11-25 DIAGNOSIS — M65331 Trigger finger, right middle finger: Secondary | ICD-10-CM | POA: Diagnosis not present

## 2022-11-25 DIAGNOSIS — M19041 Primary osteoarthritis, right hand: Secondary | ICD-10-CM | POA: Diagnosis not present

## 2022-11-25 DIAGNOSIS — Z7984 Long term (current) use of oral hypoglycemic drugs: Secondary | ICD-10-CM | POA: Diagnosis not present

## 2022-11-25 DIAGNOSIS — J449 Chronic obstructive pulmonary disease, unspecified: Secondary | ICD-10-CM | POA: Diagnosis not present

## 2022-11-25 DIAGNOSIS — Z955 Presence of coronary angioplasty implant and graft: Secondary | ICD-10-CM | POA: Diagnosis not present

## 2022-11-25 DIAGNOSIS — I2510 Atherosclerotic heart disease of native coronary artery without angina pectoris: Secondary | ICD-10-CM | POA: Diagnosis not present

## 2022-11-25 DIAGNOSIS — N189 Chronic kidney disease, unspecified: Secondary | ICD-10-CM | POA: Diagnosis not present

## 2022-11-25 DIAGNOSIS — I252 Old myocardial infarction: Secondary | ICD-10-CM | POA: Diagnosis not present

## 2022-11-25 DIAGNOSIS — Z794 Long term (current) use of insulin: Secondary | ICD-10-CM | POA: Diagnosis not present

## 2022-11-25 DIAGNOSIS — E1122 Type 2 diabetes mellitus with diabetic chronic kidney disease: Secondary | ICD-10-CM | POA: Diagnosis not present

## 2022-11-25 DIAGNOSIS — Z79899 Other long term (current) drug therapy: Secondary | ICD-10-CM | POA: Diagnosis not present

## 2022-11-25 DIAGNOSIS — K219 Gastro-esophageal reflux disease without esophagitis: Secondary | ICD-10-CM | POA: Diagnosis not present

## 2022-11-30 DIAGNOSIS — E1122 Type 2 diabetes mellitus with diabetic chronic kidney disease: Secondary | ICD-10-CM | POA: Diagnosis not present

## 2022-11-30 DIAGNOSIS — I10 Essential (primary) hypertension: Secondary | ICD-10-CM | POA: Diagnosis not present

## 2022-11-30 DIAGNOSIS — N182 Chronic kidney disease, stage 2 (mild): Secondary | ICD-10-CM | POA: Diagnosis not present

## 2022-11-30 DIAGNOSIS — L84 Corns and callosities: Secondary | ICD-10-CM | POA: Diagnosis not present

## 2022-11-30 DIAGNOSIS — E785 Hyperlipidemia, unspecified: Secondary | ICD-10-CM | POA: Diagnosis not present

## 2022-11-30 DIAGNOSIS — E1143 Type 2 diabetes mellitus with diabetic autonomic (poly)neuropathy: Secondary | ICD-10-CM | POA: Diagnosis not present

## 2022-11-30 DIAGNOSIS — G5601 Carpal tunnel syndrome, right upper limb: Secondary | ICD-10-CM | POA: Diagnosis not present

## 2022-11-30 DIAGNOSIS — K219 Gastro-esophageal reflux disease without esophagitis: Secondary | ICD-10-CM | POA: Diagnosis not present

## 2022-12-03 ENCOUNTER — Encounter (INDEPENDENT_AMBULATORY_CARE_PROVIDER_SITE_OTHER): Payer: Self-pay | Admitting: *Deleted

## 2022-12-04 ENCOUNTER — Other Ambulatory Visit: Payer: Self-pay | Admitting: Internal Medicine

## 2022-12-14 DIAGNOSIS — Z122 Encounter for screening for malignant neoplasm of respiratory organs: Secondary | ICD-10-CM | POA: Diagnosis not present

## 2022-12-14 DIAGNOSIS — E1122 Type 2 diabetes mellitus with diabetic chronic kidney disease: Secondary | ICD-10-CM | POA: Diagnosis not present

## 2022-12-14 DIAGNOSIS — Z992 Dependence on renal dialysis: Secondary | ICD-10-CM | POA: Diagnosis not present

## 2022-12-14 DIAGNOSIS — F17210 Nicotine dependence, cigarettes, uncomplicated: Secondary | ICD-10-CM | POA: Diagnosis not present

## 2022-12-14 DIAGNOSIS — N186 End stage renal disease: Secondary | ICD-10-CM | POA: Diagnosis not present

## 2022-12-25 ENCOUNTER — Telehealth (INDEPENDENT_AMBULATORY_CARE_PROVIDER_SITE_OTHER): Payer: Self-pay | Admitting: Gastroenterology

## 2022-12-25 NOTE — Telephone Encounter (Signed)
Ok to schedule.  Room 3  Thanks,  Vista Lawman, MD Gastroenterology and Hepatology [facility]

## 2022-12-25 NOTE — Telephone Encounter (Signed)
Who is your primary care physician: Dr.Hasanaj  Reasons for the colonoscopy: screening  Have you had a colonoscopy before?  no  Do you have family history of colon cancer? no  Previous colonoscopy with polyps removed? no  Do you have a history colorectal cancer?   no  Are you diabetic? If yes, Type 1 or Type 2?    Yes type 2  Do you have a prosthetic or mechanical heart valve? no  Do you have a pacemaker/defibrillator?   no  Have you had endocarditis/atrial fibrillation? yes  Have you had joint replacement within the last 12 months?  no  Do you tend to be constipated or have to use laxatives? no  Do you have any history of drugs or alchohol?  yes  Do you use supplemental oxygen?  no  Have you had a stroke or heart attack within the last 6 months? no  Do you take weight loss medication?  no  For female patients: have you had a hysterectomy?  yes                                     are you post menopausal?       no                                            do you still have your menstrual cycle? no      Do you take any blood-thinning medications such as: (aspirin, warfarin, Plavix, Aggrenox)  no  If yes we need the name, milligram, dosage and who is prescribing doctor  Current Outpatient Medications on File Prior to Visit  Medication Sig Dispense Refill   albuterol (PROVENTIL HFA;VENTOLIN HFA) 108 (90 Base) MCG/ACT inhaler Inhale 1 puff into the lungs every 6 (six) hours as needed for wheezing or shortness of breath. 54 g 3   albuterol (PROVENTIL) (2.5 MG/3ML) 0.083% nebulizer solution Take 3 mLs (2.5 mg total) by nebulization every 6 (six) hours as needed for wheezing or shortness of breath. 75 mL 12   amphetamine-dextroamphetamine (ADDERALL) 20 MG tablet Take 20 mg by mouth 3 (three) times daily.     aspirin EC 81 MG tablet Take 81 mg by mouth daily. Swallow whole.     Blood Glucose Monitoring Suppl (TRUE METRIX METER) w/Device KIT Use as directed 1 kit 0   celecoxib  (CELEBREX) 200 MG capsule Take 200 mg by mouth daily.     citalopram (CELEXA) 20 MG tablet Take 20 mg by mouth daily.     cloNIDine (CATAPRES) 0.1 MG tablet Take 0.2 mg by mouth every morning.     desvenlafaxine (PRISTIQ) 100 MG 24 hr tablet Take 100 mg by mouth daily.     diclofenac Sodium (VOLTAREN) 1 % GEL Gram(s) Topical 4 Times Daily     escitalopram (LEXAPRO) 10 MG tablet Take 1 tablet by mouth daily.     ezetimibe (ZETIA) 10 MG tablet Take 1 tablet (10 mg total) by mouth daily. 90 tablet 2   FARXIGA 10 MG TABS tablet Take 10 mg by mouth every morning.     glucose blood (TRUE METRIX BLOOD GLUCOSE TEST) test strip Use as instructed 100 each 12   HYDROcodone-acetaminophen (NORCO) 7.5-325 MG tablet Take 1 tablet by mouth 2 (two) times daily.  Insulin Disposable Pump (OMNIPOD 5 G6 INTRO, GEN 5,) KIT by Does not apply route.     lidocaine (LIDODERM) 5 % SMARTSIG:Topical     lisinopril (ZESTRIL) 20 MG tablet Take 20 mg by mouth daily.     meclizine (ANTIVERT) 50 MG tablet Take 0.5 tablets (25 mg total) by mouth 3 (three) times daily as needed. 30 tablet 2   metoprolol tartrate (LOPRESSOR) 50 MG tablet TAKE 1 TABLET BY MOUTH TWICE A DAY 180 tablet 1   omeprazole (PRILOSEC) 40 MG capsule Take 40 mg by mouth daily.     OZEMPIC, 0.25 OR 0.5 MG/DOSE, 2 MG/3ML SOPN Inject 0.5 mg into the skin once a week.     pregabalin (LYRICA) 25 MG capsule Take 25 mg by mouth 2 (two) times daily.     rosuvastatin (CRESTOR) 40 MG tablet TAKE 1 TABLET BY MOUTH EVERYDAY AT BEDTIME 90 tablet 1   tiZANidine (ZANAFLEX) 4 MG tablet Take 4 mg by mouth 2 (two) times daily.     TRUEPLUS LANCETS 28G MISC Use as directed 100 each 12   No current facility-administered medications on file prior to visit.    Allergies  Allergen Reactions   Atomoxetine     Other reaction(s): Other (See Comments) Caused yeast infection per [facility] records    Oxycodone    Oxycodone-Acetaminophen    Phenazopyridine    Strattera  [Atomoxetine Hcl] Other (See Comments)    Caused yeast infection per [facility] records   Pyridium [Phenazopyridine Hcl] Itching     Pharmacy: [facility] [employer] [employer] Name: [employer] number where you can be reached: [phone]

## 2023-01-08 ENCOUNTER — Encounter (INDEPENDENT_AMBULATORY_CARE_PROVIDER_SITE_OTHER): Payer: Self-pay | Admitting: *Deleted

## 2023-01-08 ENCOUNTER — Other Ambulatory Visit: Payer: Self-pay | Admitting: Internal Medicine

## 2023-01-08 MED ORDER — PEG 3350-KCL-NA BICARB-NACL 420 G PO SOLR: 4000.0000 mL | Freq: Once | ORAL | 0 refills | Status: AC

## 2023-01-08 NOTE — Telephone Encounter (Signed)
Referral completed, [facility] apt letter sent to PCP

## 2023-01-08 NOTE — Addendum Note (Signed)
Addended by: Marlowe Shores on: 01/08/2023 09:23 AM   Modules accepted: Orders

## 2023-01-08 NOTE — Telephone Encounter (Signed)
This RX was prescribed at office visit by Dr. Jenene Slicker. As this is not a cardiac drug, please address. Thank You

## 2023-01-08 NOTE — Telephone Encounter (Signed)
Pt contacted and scheduled for 02/04/23. Will mail instructions once pre op is received. Will need to do PA after first of year. Prep sent to pharmacy

## 2023-01-10 ENCOUNTER — Other Ambulatory Visit: Payer: Self-pay | Admitting: Internal Medicine

## 2023-01-14 NOTE — Telephone Encounter (Signed)
 No PA needed via [facility]

## 2023-01-26 DIAGNOSIS — Z79899 Other long term (current) drug therapy: Secondary | ICD-10-CM | POA: Diagnosis not present

## 2023-02-02 ENCOUNTER — Other Ambulatory Visit: Payer: Self-pay

## 2023-02-02 ENCOUNTER — Encounter (HOSPITAL_COMMUNITY)
Admission: RE | Admit: 2023-02-02 | Discharge: 2023-02-02 | Disposition: A | Discharge status: Home / Self Care | Payer: 59 | Source: Ambulatory Visit | Attending: Gastroenterology | Admitting: Gastroenterology

## 2023-02-02 ENCOUNTER — Encounter (HOSPITAL_COMMUNITY): Payer: Self-pay

## 2023-02-02 NOTE — Patient Instructions (Signed)
Kimberly [name]  02/02/2023       Your procedure is scheduled on 02/04/23.  Report to [name] at 6:15 A.M.  Call this number if you have problems the morning of surgery:  [phone]  If you experience any cold or flu symptoms such as cough, fever, chills, shortness of breath, etc. between now and your scheduled surgery, please notify us at the above number.   Remember:  Do not eat or drink after midnight.  You may drink clear liquids until 04:15 .  Clear liquids allowed are:                    Water, Juice (No red color; non-citric and without pulp; diabetics please choose diet or no sugar options), Carbonated beverages (diabetics please choose diet or no sugar options), Clear Tea (No creamer, milk, or cream, including half & half and powdered creamer), Black Coffee Only (No creamer, milk or cream, including half & half and powdered creamer), and Clear Sports drink (No red color; diabetics please choose diet or no sugar options)    Take these medicines the morning of surgery with A SIP OF WATER Clonidine, Lyrica, omperazole, celex, lexapro, metoprolol, Hydrocodone    Do not wear jewelry, make-up or nail polish, including gel polish,  artificial nails, or any other type of covering on natural nails (fingers and  toes).  Do not wear lotions, powders, or perfumes, or deodorant.  Do not shave 48 hours prior to surgery.  Men may shave face and neck.  Do not bring valuables to the hospital.  [facility] is not responsible for any belongings or valuables.  Contacts, dentures or bridgework may not be worn into surgery.  Leave your suitcase in the car.  After surgery it may be brought to your room.  For patients admitted to the hospital, discharge time will be determined by your treatment team.  Patients discharged the day of surgery will not be allowed to drive home.   Please read over the following fact sheets that you were given.  Colonoscopy, Adult, Care After The following  information offers guidance on how to care for yourself after your procedure. Your health care provider may also give you more specific instructions. If you have problems or questions, contact your health care provider. What can I expect after the procedure? After the procedure, it is common to have: A small amount of blood in your stool for 24 hours after the procedure. Some gas. Mild cramping or bloating of your abdomen. Follow these instructions at home: Eating and drinking  Drink enough fluid to keep your urine pale yellow. Follow instructions from your health care provider about eating or drinking restrictions. Resume your normal diet as told by your health care provider. Avoid heavy or fried foods that are hard to digest. Activity Rest as told by your health care provider. Avoid sitting for a long time without moving. Get up to take short walks every 1-2 hours. This is important to improve blood flow and breathing. Ask for help if you feel weak or unsteady. Return to your normal activities as told by your health care provider. Ask your health care provider what activities are safe for you. Managing cramping and bloating  Try walking around when you have cramps or feel bloated. If directed, apply heat to your abdomen as told by your health care provider. Use the heat source that your health care provider recommends, such as a moist heat pack or a heating  pad. Place a towel between your skin and the heat source. Leave the heat on for 20-30 minutes. Remove the heat if your skin turns bright red. This is especially important if you are unable to feel pain, heat, or cold. You have a greater risk of getting burned. General instructions If you were given a sedative during the procedure, it can affect you for several hours. Do not drive or operate machinery until your health care provider says that it is safe. For the first 24 hours after the procedure: Do not sign important documents. Do  not drink alcohol. Do your regular daily activities at a slower pace than normal. Eat soft foods that are easy to digest. Take over-the-counter and prescription medicines only as told by your health care provider. Keep all follow-up visits. This is important. Contact a health care provider if: You have blood in your stool 2-3 days after the procedure. Get help right away if: You have more than a small spotting of blood in your stool. You have large blood clots in your stool. You have swelling of your abdomen. You have nausea or vomiting. You have a fever. You have increasing pain in your abdomen that is not relieved with medicine. These symptoms may be an emergency. Get help right away. Call 911. Do not wait to see if the symptoms will go away. Do not drive yourself to the hospital. Summary After the procedure, it is common to have a small amount of blood in your stool. You may also have mild cramping and bloating of your abdomen. If you were given a sedative during the procedure, it can affect you for several hours. Do not drive or operate machinery until your health care provider says that it is safe. Get help right away if you have a lot of blood in your stool, nausea or vomiting, a fever, or increased pain in your abdomen. This information is not intended to replace advice given to you by your health care provider. Make sure you discuss any questions you have with your health care provider. Document Revised: 02/10/2022 Document Reviewed: 08/21/2020 Elsevier Patient Education  2024 [employer].

## 2023-02-02 NOTE — Pre-Procedure Instructions (Signed)
Attempted pre-op phonecall. Left VM for her to call us back. 

## 2023-02-04 ENCOUNTER — Ambulatory Visit (HOSPITAL_COMMUNITY)
Admission: RE | Admit: 2023-02-04 | Discharge: 2023-02-04 | Disposition: A | Discharge status: Home / Self Care | Payer: 59 | Source: Ambulatory Visit | Attending: Gastroenterology | Admitting: Gastroenterology

## 2023-02-04 ENCOUNTER — Encounter (HOSPITAL_COMMUNITY): Payer: Self-pay | Admitting: Gastroenterology

## 2023-02-04 ENCOUNTER — Other Ambulatory Visit: Payer: Self-pay

## 2023-02-04 ENCOUNTER — Ambulatory Visit (HOSPITAL_COMMUNITY): Payer: 59 | Admitting: Certified Registered"

## 2023-02-04 ENCOUNTER — Encounter (INDEPENDENT_AMBULATORY_CARE_PROVIDER_SITE_OTHER): Payer: Self-pay | Admitting: *Deleted

## 2023-02-04 ENCOUNTER — Encounter (HOSPITAL_COMMUNITY)
Admission: RE | Disposition: A | Discharge status: Home / Self Care | Payer: Self-pay | Source: Ambulatory Visit | Attending: Gastroenterology

## 2023-02-04 DIAGNOSIS — E1122 Type 2 diabetes mellitus with diabetic chronic kidney disease: Secondary | ICD-10-CM | POA: Diagnosis not present

## 2023-02-04 DIAGNOSIS — Z1211 Encounter for screening for malignant neoplasm of colon: Secondary | ICD-10-CM | POA: Diagnosis not present

## 2023-02-04 DIAGNOSIS — D120 Benign neoplasm of cecum: Secondary | ICD-10-CM | POA: Insufficient documentation

## 2023-02-04 DIAGNOSIS — N182 Chronic kidney disease, stage 2 (mild): Secondary | ICD-10-CM | POA: Insufficient documentation

## 2023-02-04 DIAGNOSIS — Z139 Encounter for screening, unspecified: Secondary | ICD-10-CM | POA: Diagnosis not present

## 2023-02-04 DIAGNOSIS — I252 Old myocardial infarction: Secondary | ICD-10-CM | POA: Insufficient documentation

## 2023-02-04 DIAGNOSIS — Z9641 Presence of insulin pump (external) (internal): Secondary | ICD-10-CM | POA: Diagnosis not present

## 2023-02-04 DIAGNOSIS — D125 Benign neoplasm of sigmoid colon: Secondary | ICD-10-CM

## 2023-02-04 DIAGNOSIS — I2510 Atherosclerotic heart disease of native coronary artery without angina pectoris: Secondary | ICD-10-CM | POA: Diagnosis not present

## 2023-02-04 DIAGNOSIS — F17210 Nicotine dependence, cigarettes, uncomplicated: Secondary | ICD-10-CM | POA: Diagnosis not present

## 2023-02-04 DIAGNOSIS — K621 Rectal polyp: Secondary | ICD-10-CM | POA: Diagnosis not present

## 2023-02-04 DIAGNOSIS — Z7984 Long term (current) use of oral hypoglycemic drugs: Secondary | ICD-10-CM | POA: Diagnosis not present

## 2023-02-04 DIAGNOSIS — D122 Benign neoplasm of ascending colon: Secondary | ICD-10-CM | POA: Diagnosis not present

## 2023-02-04 DIAGNOSIS — K635 Polyp of colon: Secondary | ICD-10-CM | POA: Insufficient documentation

## 2023-02-04 DIAGNOSIS — I25119 Atherosclerotic heart disease of native coronary artery with unspecified angina pectoris: Secondary | ICD-10-CM | POA: Diagnosis not present

## 2023-02-04 DIAGNOSIS — K644 Residual hemorrhoidal skin tags: Secondary | ICD-10-CM | POA: Diagnosis not present

## 2023-02-04 DIAGNOSIS — K648 Other hemorrhoids: Secondary | ICD-10-CM | POA: Insufficient documentation

## 2023-02-04 DIAGNOSIS — I10 Essential (primary) hypertension: Secondary | ICD-10-CM | POA: Diagnosis not present

## 2023-02-04 DIAGNOSIS — D128 Benign neoplasm of rectum: Secondary | ICD-10-CM | POA: Insufficient documentation

## 2023-02-04 DIAGNOSIS — Z7985 Long-term (current) use of injectable non-insulin antidiabetic drugs: Secondary | ICD-10-CM | POA: Insufficient documentation

## 2023-02-04 DIAGNOSIS — D123 Benign neoplasm of transverse colon: Secondary | ICD-10-CM | POA: Diagnosis not present

## 2023-02-04 DIAGNOSIS — I129 Hypertensive chronic kidney disease with stage 1 through stage 4 chronic kidney disease, or unspecified chronic kidney disease: Secondary | ICD-10-CM | POA: Insufficient documentation

## 2023-02-04 HISTORY — PX: POLYPECTOMY: SHX5525

## 2023-02-04 HISTORY — PX: COLONOSCOPY WITH PROPOFOL: SHX5780

## 2023-02-04 LAB — GLUCOSE, CAPILLARY
Glucose-Capillary: 65 mg/dL — ABNORMAL LOW (ref 70–99)
Glucose-Capillary: 99 mg/dL (ref 70–99)

## 2023-02-04 LAB — HM COLONOSCOPY

## 2023-02-04 SURGERY — COLONOSCOPY WITH PROPOFOL
Anesthesia: General

## 2023-02-04 MED ORDER — PROPOFOL 1000 MG/100ML IV EMUL
INTRAVENOUS | Status: AC
  Filled 2023-02-04: qty 100

## 2023-02-04 MED ORDER — LIDOCAINE HCL (PF) 2 % IJ SOLN
INTRAMUSCULAR | Status: AC
  Filled 2023-02-04: qty 5

## 2023-02-04 MED ORDER — DEXTROSE 50 % IV SOLN
INTRAVENOUS | Status: AC
  Filled 2023-02-04: qty 50

## 2023-02-04 MED ORDER — DEXTROSE 50 % IV SOLN
25.0000 mL | Freq: Once | INTRAVENOUS | Status: AC
  Administered 2023-02-04: 25 mL via INTRAVENOUS

## 2023-02-04 MED ORDER — LACTATED RINGERS IV SOLN: INTRAVENOUS | Status: DC | PRN

## 2023-02-04 MED ORDER — LIDOCAINE HCL (CARDIAC) PF 100 MG/5ML IV SOSY
PREFILLED_SYRINGE | INTRAVENOUS | Status: DC | PRN
  Administered 2023-02-04: 100 mg via INTRAVENOUS

## 2023-02-04 MED ORDER — PROPOFOL 10 MG/ML IV BOLUS
INTRAVENOUS | Status: DC | PRN
  Administered 2023-02-04: 25 mg via INTRAVENOUS
  Administered 2023-02-04: 20 mg via INTRAVENOUS
  Administered 2023-02-04: 70 mg via INTRAVENOUS
  Administered 2023-02-04: 20 mg via INTRAVENOUS
  Administered 2023-02-04: 25 mg via INTRAVENOUS

## 2023-02-04 MED ORDER — PROPOFOL 500 MG/50ML IV EMUL
INTRAVENOUS | Status: DC | PRN
  Administered 2023-02-04: 150 ug/kg/min via INTRAVENOUS

## 2023-02-04 MED ORDER — PHENYLEPHRINE 80 MCG/ML (10ML) SYRINGE FOR IV PUSH (FOR BLOOD PRESSURE SUPPORT)
PREFILLED_SYRINGE | INTRAVENOUS | Status: DC | PRN
  Administered 2023-02-04: 80 ug via INTRAVENOUS

## 2023-02-04 NOTE — Discharge Instructions (Addendum)
  Discharge instructions Please read the instructions outlined below and refer to this sheet in the next few weeks. These discharge instructions provide you with general information on caring for yourself after you leave the hospital. Your doctor may also give you specific instructions. While your treatment has been planned according to the most current medical practices available, unavoidable complications occasionally occur. If you have any problems or questions after discharge, please call your doctor. ACTIVITY You may resume your regular activity but move at a slower pace for the next 24 hours.  Take frequent rest periods for the next 24 hours.  Walking will help expel (get rid of) the air and reduce the bloated feeling in your abdomen.  No driving for 24 hours (because of the anesthesia (medicine) used during the test).  You may shower.  Do not sign any important legal documents or operate any machinery for 24 hours (because of the anesthesia used during the test).  NUTRITION Drink plenty of fluids.  You may resume your normal diet.  Begin with a light meal and progress to your normal diet.  Avoid alcoholic beverages for 24 hours or as instructed by your caregiver.  MEDICATIONS You may resume your normal medications unless your caregiver tells you otherwise.  WHAT YOU CAN EXPECT TODAY You may experience abdominal discomfort such as a feeling of fullness or "gas" pains.  FOLLOW-UP Your doctor will discuss the results of your test with you.  SEEK IMMEDIATE MEDICAL ATTENTION IF ANY OF THE FOLLOWING OCCUR: Excessive nausea (feeling sick to your stomach) and/or vomiting.  Severe abdominal pain and distention (swelling).  Trouble swallowing.  Temperature over 101 F (37.8 C).  Rectal bleeding or vomiting of blood.      Avoid using high dose aspirin including Goody/BC powders, NSAIDs such as Aleve, ibuprofen, naproxen, Motrin, Voltaren or Advil (even the topical ones) for 14 days   I  hope you have a great rest of your week!   Vista Lawman , M.D.. Gastroenterology and Hepatology [facility]

## 2023-02-04 NOTE — Transfer of Care (Signed)
Immediate Anesthesia Transfer of Care Note  Patient: Kimberly [name]  Procedure(s) Performed: COLONOSCOPY WITH PROPOFOL POLYPECTOMY  Patient Location: Endoscopy Unit  Anesthesia Type:General  Level of Consciousness: drowsy and patient cooperative  Airway & Oxygen Therapy: Patient Spontanous Breathing and Patient connected to nasal cannula oxygen  Post-op Assessment: Report given to RN and Post -op Vital signs reviewed and stable  Post vital signs: Reviewed and stable  Last Vitals:  Vitals Value Taken Time  BP 138/64 02/04/23 0815  Temp 36.5 C 02/04/23 0815  Pulse 79 02/04/23 0815  Resp 19 02/04/23 0819  SpO2 100 % 02/04/23 0815  Vitals shown include unfiled device data.  Last Pain:  Vitals:   02/04/23 0815  TempSrc: Axillary  PainSc:          Complications: No notable events documented.

## 2023-02-04 NOTE — Anesthesia Preprocedure Evaluation (Signed)
Anesthesia Evaluation  Patient identified by MRN, date of birth, ID band Patient awake    Reviewed: Allergy & Precautions, H&P , NPO status , Patient's Chart, lab work & pertinent test results, reviewed documented beta blocker date and time   Airway Mallampati: II  TM Distance: >3 FB Neck ROM: full    Dental no notable dental hx.    Pulmonary neg pulmonary ROS, asthma , Current Smoker and Patient abstained from smoking.   Pulmonary exam normal breath sounds clear to auscultation       Cardiovascular Exercise Tolerance: Good hypertension, + angina  + CAD, + Past MI and + DOE  negative cardio ROS  Rhythm:regular Rate:Normal     Neuro/Psych  PSYCHIATRIC DISORDERS      negative neurological ROS  negative psych ROS   GI/Hepatic negative GI ROS, Neg liver ROS,,,  Endo/Other  negative endocrine ROSdiabetes    Renal/GU Renal diseasenegative Renal ROS  negative genitourinary   Musculoskeletal   Abdominal   Peds  Hematology negative hematology ROS (+)   Anesthesia Other Findings   Reproductive/Obstetrics negative OB ROS                             Anesthesia Physical Anesthesia Plan  ASA: 3  Anesthesia Plan: General   Post-op Pain Management:    Induction:   PONV Risk Score and Plan: Propofol infusion  Airway Management Planned:   Additional Equipment:   Intra-op Plan:   Post-operative Plan:   Informed Consent: I have reviewed the patients History and Physical, chart, labs and discussed the procedure including the risks, benefits and alternatives for the proposed anesthesia with the patient or authorized representative who has indicated his/her understanding and acceptance.     Dental Advisory Given  Plan Discussed with: CRNA  Anesthesia Plan Comments:        Anesthesia Quick Evaluation

## 2023-02-04 NOTE — Op Note (Signed)
[facility] Patient Name: Kimberly [name] Procedure Date: 02/04/2023 7:05 AM MRN: [id number] Date of Birth: 05-01-1965 Attending MD: Sanjuan Dame , MD, [id number] CSN: [id number] Age: 58 Admit Type: Outpatient Procedure:                Colonoscopy Indications:              Screening for colorectal malignant neoplasm Providers:                Sanjuan Dame, MD, Edrick Kins, RN, Pandora Leiter, Technician Referring MD:              Medicines:                Monitored Anesthesia Care Complications:            No immediate complications. Estimated Blood Loss:     Estimated blood loss was minimal. Procedure:                Pre-Anesthesia Assessment:                           - Prior to the procedure, a History and Physical                            was performed, and patient medications and                            allergies were reviewed. The patient's tolerance of                            previous anesthesia was also reviewed. The risks                            and benefits of the procedure and the sedation                            options and risks were discussed with the patient.                            All questions were answered, and informed consent                            was obtained. Prior Anticoagulants: The patient has                            taken no anticoagulant or antiplatelet agents                            except for aspirin. ASA Grade Assessment: III - A                            patient with severe systemic disease. After  reviewing the risks and benefits, the patient was                            deemed in satisfactory condition to undergo the                            procedure.                           After obtaining informed consent, the colonoscope                            was passed under direct vision. Throughout the                            procedure, the patient's blood  pressure, pulse, and                            oxygen saturations were monitored continuously. The                            [id number]) scope was introduced through                            the anus and advanced to the the cecum, identified                            by appendiceal orifice and ileocecal valve. The                            colonoscopy was performed without difficulty. The                            patient tolerated the procedure well. The quality                            of the bowel preparation was evaluated using the                            BBPS [facility] Bowel Preparation Scale) with scores                            of: Right Colon = 3, Transverse Colon = 3 and Left                            Colon = 3 (entire mucosa seen well with no residual                            staining, small fragments of stool or opaque                            liquid). The total BBPS score equals 9. The  ileocecal valve, appendiceal orifice, and rectum                            were photographed. Scope In: 7:41:08 AM Scope Out: 8:12:31 AM Scope Withdrawal Time: 0 hours 29 minutes 0 seconds  Total Procedure Duration: 0 hours 31 minutes 23 seconds  Findings:      Six sessile polyps were found in the rectum, sigmoid colon, transverse       colon and cecum. The polyps were 3 to 7 mm in size. These polyps were       removed with a cold snare. Resection and retrieval were complete.      A 10 mm polyp was found in the ascending colon. The polyp was sessile.       The polyp was removed with a cold snare. Resection and retrieval were       complete.      Non-bleeding external and internal hemorrhoids were found during       retroflexion. The hemorrhoids were small. Impression:               - Six 3 to 7 mm polyps in the rectum, in the                            sigmoid colon, in the transverse colon and in the                            cecum,  removed with a cold snare. Resected and                            retrieved.                           - One 10 mm polyp in the ascending colon, removed                            with a cold snare. Resected and retrieved.                           - Non-bleeding external and internal hemorrhoids. Moderate Sedation:      Per Anesthesia Care Recommendation:           - Patient has a contact number available for                            emergencies. The signs and symptoms of potential                            delayed complications were discussed with the                            patient. Return to normal activities tomorrow.                            Written discharge instructions were provided to the  patient.                           - Resume previous diet.                           - Continue present medications.                           - Await pathology results.                           - Repeat colonoscopy in 3 years for surveillance.                           - Return to primary care physician as previously                            scheduled. Procedure Code(s):        --- Professional ---                           [phone], Colonoscopy, flexible; with removal of                            tumor(s), polyp(s), or other lesion(s) by snare                            technique Diagnosis Code(s):        --- Professional ---                           Z12.11, Encounter for screening for malignant                            neoplasm of colon                           D12.8, Benign neoplasm of rectum                           D12.5, Benign neoplasm of sigmoid colon                           D12.3, Benign neoplasm of transverse colon (hepatic                            flexure or splenic flexure)                           D12.0, Benign neoplasm of cecum                           D12.2, Benign neoplasm of ascending colon                           K64.8,  Other hemorrhoids CPT copyright 2022 American Medical Association. All rights reserved. The codes documented in  this report are preliminary and upon coder review may  be revised to meet current compliance requirements. Sanjuan Dame, MD Sanjuan Dame, MD 02/04/2023 8:17:50 AM This report has been signed electronically. Number of Addenda: 0

## 2023-02-04 NOTE — H&P (Signed)
Primary Care Physician:  Toma Deiters, MD Primary Gastroenterologist:  Dr. Tasia Catchings  Pre-Procedure History & Physical: HPI:  Kimberly [name] is a 57 y.o. female is here for a colonoscopy for colon cancer screening purposes.  Patient denies any family history of colorectal cancer.  No melena or hematochezia.  No abdominal pain or unintentional weight loss.  No change in bowel habits.  Overall feels well from a GI standpoint.  Past Medical History:  Diagnosis Date   Asthma    CAD (coronary artery disease) 04/06/2015   CABG with LIMA-LAD   Diabetes mellitus without complication (HCC)    Myocardial infarction (HCC)    Approx 2014 per pt report, no further details available   Ovarian cancer (HCC)    Stage 2 chronic kidney disease     Past Surgical History:  Procedure Laterality Date   ABDOMINAL HYSTERECTOMY     ovarian cancer   APPENDECTOMY     CARDIAC CATHETERIZATION N/A 04/08/2015   Procedure: Left Heart Cath and Coronary Angiography;  Surgeon: Corky Crafts, MD; LAD 95%, single vessel disease    CORONARY ARTERY BYPASS GRAFT N/A 04/11/2015   Procedure: CORONARY ARTERY BYPASS GRAFTING (CABG) x one using left internal mamary artery. ;  Surgeon: Kerin Perna, MD;  LIMA-LAD   TEE WITHOUT CARDIOVERSION N/A 04/11/2015   Procedure: TRANSESOPHAGEAL ECHOCARDIOGRAM (TEE);  Surgeon: Kerin Perna, MD;  Location: [facility] OR;  Service: Open Heart Surgery;  Laterality: N/A;    Prior to Admission medications   Medication Sig Start Date End Date Taking? Authorizing Provider  albuterol (PROVENTIL HFA;VENTOLIN HFA) 108 (90 Base) MCG/ACT inhaler Inhale 1 puff into the lungs every 6 (six) hours as needed for wheezing or shortness of breath. 07/05/15  Yes Jegede, Olugbemiga E, MD  albuterol (PROVENTIL) (2.5 MG/3ML) 0.083% nebulizer solution Take 3 mLs (2.5 mg total) by nebulization every 6 (six) hours as needed for wheezing or shortness of breath. 04/15/15  Yes Barrett, Erin R, PA-C   amphetamine-dextroamphetamine (ADDERALL) 20 MG tablet Take 20 mg by mouth 3 (three) times daily.   Yes [provider]  aspirin EC 81 MG tablet Take 81 mg by mouth daily. Swallow whole.   Yes [provider]  celecoxib (CELEBREX) 200 MG capsule Take 200 mg by mouth daily. 06/19/21  Yes [provider]  citalopram (CELEXA) 20 MG tablet Take 20 mg by mouth daily. 08/28/19  Yes [provider]  cloNIDine (CATAPRES) 0.1 MG tablet Take 0.2 mg by mouth every morning.   Yes [provider]  escitalopram (LEXAPRO) 10 MG tablet Take 1 tablet by mouth daily. 03/08/18  Yes [provider]  ezetimibe (ZETIA) 10 MG tablet Take 1 tablet (10 mg total) by mouth daily. 03/09/22  Yes Mallipeddi, Vishnu P, MD  lidocaine (LIDODERM) 5 % SMARTSIG:Topical   Yes [provider]  lisinopril (ZESTRIL) 20 MG tablet Take 20 mg by mouth daily. 04/11/19  Yes [provider]  metoprolol tartrate (LOPRESSOR) 50 MG tablet TAKE 1 TABLET BY MOUTH TWICE A DAY 01/11/23  Yes Mallipeddi, Vishnu P, MD  omeprazole (PRILOSEC) 40 MG capsule Take 40 mg by mouth daily.   Yes [provider]  pregabalin (LYRICA) 25 MG capsule Take 25 mg by mouth 2 (two) times daily. 08/05/22  Yes [provider]  rosuvastatin (CRESTOR) 40 MG tablet TAKE 1 TABLET BY MOUTH EVERYDAY AT BEDTIME 12/04/22  Yes Mallipeddi, Vishnu P, MD  tiZANidine (ZANAFLEX) 4 MG tablet Take 4 mg by mouth 2 (two)  times daily. 08/10/19  Yes [provider]  TRUEPLUS LANCETS 28G MISC Use as directed 07/12/15  Yes Langeland, Dawn T, MD  Blood Glucose Monitoring Suppl (TRUE METRIX METER) w/Device KIT Use as directed 07/12/15   Dierdre Searles T, MD  desvenlafaxine (PRISTIQ) 100 MG 24 hr tablet Take 100 mg by mouth daily. 06/04/21   [provider]  diclofenac Sodium (VOLTAREN) 1 % GEL Gram(s) Topical 4 Times Daily 12/24/20   [provider]  FARXIGA 10 MG TABS tablet Take 10 mg by  mouth every morning. 05/16/21   [provider]  glucose blood (TRUE METRIX BLOOD GLUCOSE TEST) test strip Use as instructed 07/12/15   Dierdre Searles T, MD  HYDROcodone-acetaminophen (NORCO) 7.5-325 MG tablet Take 1 tablet by mouth 2 (two) times daily.    [provider]  Insulin Disposable Pump (OMNIPOD 5 G6 INTRO, GEN 5,) KIT by Does not apply route.    [provider]  meclizine (ANTIVERT) 50 MG tablet TAKE 0.5 TABLETS (25 MG TOTAL) BY MOUTH 3 (THREE) TIMES DAILY AS NEEDED. 01/08/23   Mallipeddi, Vishnu P, MD  OZEMPIC, 0.25 OR 0.5 MG/DOSE, 2 MG/3ML SOPN Inject 0.5 mg into the skin once a week. 05/28/21   [provider]    Allergies as of 01/08/2023 - Review Complete 12/25/2022  Allergen Reaction Noted   Atomoxetine  04/12/2015   Oxycodone  06/05/2019   Oxycodone-acetaminophen  10/13/2019   Phenazopyridine  06/05/2019   Strattera [atomoxetine hcl] Other (See Comments) 04/12/2015   Pyridium [phenazopyridine hcl] Itching 04/06/2015    Family History  Problem Relation Age of Onset   Heart disease Maternal Grandfather    Diabetes Mother     Social History   Socioeconomic History   Marital status: Married    Spouse name: Not on file   Number of children: Not on file   Years of education: Not on file   Highest education level: Not on file  Occupational History   Not on file  Tobacco Use   Smoking status: Every Day    Current packs/day: 0.50    Average packs/day: 0.5 packs/day for 43.4 years (21.7 ttl pk-yrs)    Types: Cigarettes    Start date: 09/14/1979   Smokeless tobacco: Never   Tobacco comments:    smokes 6 cigarettes per day --11/30/2019  Vaping Use   Vaping status: Former   Start date: 09/13/2019   Quit date: 10/02/2020  Substance and Sexual Activity   Alcohol use: Yes    Alcohol/week: 3.0 standard drinks of alcohol    Types: 3 Glasses of wine per week    Comment: occasional   Drug use: No   Sexual activity: Yes    Birth  control/protection: None, Surgical    Comment: hysterectomy  Other Topics Concern   Not on file  Social History Narrative   Not on file   Social Drivers of Health   Financial Resource Strain: Medium Risk (04/28/2022)   Received from [facility], [facility]   Overall Financial Resource Strain (CARDIA)    Difficulty of Paying Living Expenses: Somewhat hard  Food Insecurity: No Food Insecurity (04/28/2022)   Received from [facility], [facility]   Hunger Vital Sign    Worried About Running Out of Food in the Last Year: Never true    Ran Out of Food in the Last Year: Never true  Transportation Needs: No Transportation Needs (04/28/2022)   Received from [facility], [facility]   [facility] - Transportation  Lack of Transportation (Medical): No    Lack of Transportation (Non-Medical): No  Physical Activity: Unknown (04/28/2022)   Received from [facility], [facility]   Exercise Vital Sign    Days of Exercise per Week: 0 days    Minutes of Exercise per Session: Not on file  Stress: No Stress Concern Present (04/28/2022)   Received from [facility], [facility] - Occupational Stress Questionnaire    Feeling of Stress : Not at all  Social Connections: Socially Integrated (04/28/2022)   Received from [facility], [facility]   Social Network    How would you rate your social network (family, work, friends)?: Good participation with social networks  Intimate Partner Violence: Not At Risk (04/28/2022)   Received from [facility], [facility]   HITS    Over the last 12 months how often did your partner physically hurt you?: Never    Over the last 12 months how often did your partner insult you or talk down to you?: Never    Over the last 12 months how often did your partner threaten you with physical harm?: Never    Over the last 12 months how often did your partner scream or curse at you?: Never    Review of  Systems: See HPI, otherwise negative ROS  Physical Exam: Vital signs in last 24 hours: Temp:  [98.9 F (37.2 C)] 98.9 F (37.2 C) (01/23 0701) Pulse Rate:  [60] 60 (01/23 0701) Resp:  [20] 20 (01/23 0701) BP: (157)/(82) 157/82 (01/23 0701) SpO2:  [98 %] 98 % (01/23 0701) Weight:  [80.7 kg] 80.7 kg (01/23 0701)   General:   Alert,  Well-developed, well-nourished, pleasant and cooperative in NAD Head:  Normocephalic and atraumatic. Eyes:  Sclera clear, no icterus.   Conjunctiva pink. Ears:  Normal auditory acuity. Nose:  No deformity, discharge,  or lesions. Msk:  Symmetrical without gross deformities. Normal posture. Extremities:  Without clubbing or edema. Neurologic:  Alert and  oriented x4;  grossly normal neurologically. Skin:  Intact without significant lesions or rashes. Psych:  Alert and cooperative. Normal mood and affect.  Impression/Plan: Kimberly [name] is here for a colonoscopy to be performed for colon cancer screening purposes.  The risks of the procedure including infection, bleed, or perforation as well as benefits, limitations, alternatives and imponderables have been reviewed with the patient. Questions have been answered. All parties agreeable.

## 2023-02-05 ENCOUNTER — Encounter (HOSPITAL_COMMUNITY): Payer: Self-pay | Admitting: Gastroenterology

## 2023-02-05 LAB — SURGICAL PATHOLOGY

## 2023-02-09 NOTE — Progress Notes (Signed)
I reviewed the pathology results. Ann, can you send her a letter with the findings as described below please?  Repeat colonoscopy in 3 years  Thanks,  Vista Lawman, MD Gastroenterology and Hepatology [facility]  ---------------------------------------------------------------------------------------------  [facility] [address], [place], [place] [address] Phone:  [phone]   02/09/23 [name], [place]   Dear Kimberly [name],  I am writing to inform you that the biopsies taken during your recent endoscopic examination showed:  I am writing to let you know the results of your recent colonoscopy.  You had a total of 7 polyps removed. The pathology came back as "tubular adenoma and hyperplastic polyp ." These findings are NOT cancer, but had the polyps ( tubular adenoma) remained in your colon, they could have turned into cancer.  Given these findings, it is recommended that your next colonoscopy be performed in 3 years.  Also I value your feedback , so if you get a survey , please take the time to fill it out and thank you for choosing [facility]  Please call us at [phone] if you have persistent problems or have questions about your condition that have not been fully answered at this time.  Sincerely,  Vista Lawman, MD Gastroenterology and Hepatology

## 2023-02-10 ENCOUNTER — Encounter (INDEPENDENT_AMBULATORY_CARE_PROVIDER_SITE_OTHER): Payer: Self-pay | Admitting: *Deleted

## 2023-02-12 NOTE — Anesthesia Postprocedure Evaluation (Signed)
Anesthesia Post Note  Patient: Kimberly [name]  Procedure(s) Performed: COLONOSCOPY WITH PROPOFOL POLYPECTOMY  Patient location during evaluation: Phase II Anesthesia Type: General Level of consciousness: awake Pain management: pain level controlled Vital Signs Assessment: post-procedure vital signs reviewed and stable Respiratory status: spontaneous breathing and respiratory function stable Cardiovascular status: blood pressure returned to baseline and stable Postop Assessment: no headache and no apparent nausea or vomiting Anesthetic complications: no Comments: Late entry   No notable events documented.   Last Vitals:  Vitals:   02/04/23 0701 02/04/23 0815  BP: (!) 157/82 138/64  Pulse: 60 79  Resp: 20 18  Temp: 37.2 C 36.5 C  SpO2: 98% 100%    Last Pain:  Vitals:   02/04/23 0815  TempSrc: Axillary  PainSc: 0-No pain                 Windell Norfolk

## 2023-02-17 ENCOUNTER — Other Ambulatory Visit: Payer: 59

## 2023-02-17 ENCOUNTER — Encounter: Payer: Self-pay | Admitting: Internal Medicine

## 2023-02-17 ENCOUNTER — Ambulatory Visit: Payer: 59 | Attending: Internal Medicine | Admitting: Internal Medicine

## 2023-02-17 ENCOUNTER — Other Ambulatory Visit: Payer: Self-pay | Admitting: Internal Medicine

## 2023-02-17 VITALS — BP 136/68 | HR 66 | Ht 66.0 in | Wt 190.2 lb

## 2023-02-17 DIAGNOSIS — I2510 Atherosclerotic heart disease of native coronary artery without angina pectoris: Secondary | ICD-10-CM | POA: Diagnosis not present

## 2023-02-17 DIAGNOSIS — E785 Hyperlipidemia, unspecified: Secondary | ICD-10-CM

## 2023-02-17 DIAGNOSIS — R42 Dizziness and giddiness: Secondary | ICD-10-CM

## 2023-02-17 DIAGNOSIS — I10 Essential (primary) hypertension: Secondary | ICD-10-CM

## 2023-02-17 NOTE — Patient Instructions (Signed)
 Medication Instructions:  Your physician recommends that you continue on your current medications as directed. Please refer to the Current Medication list given to you today.   Labwork: Lipid panel to be completed today at [facility] or [facility]  Testing/Procedures: Your physician has recommended that you wear a Zio monitor.   This monitor is a medical device that records the heart's electrical activity. Doctors most often use these monitors to diagnose arrhythmias. Arrhythmias are problems with the speed or rhythm of the heartbeat. The monitor is a small device applied to your chest. You can wear one while you do your normal daily activities. While wearing this monitor if you have any symptoms to push the button and record what you felt. Once you have worn this monitor for the period of time provider prescribed (for 14 days), you will return the monitor device in the postage paid box. Once it is returned they will download the data collected and provide us  with a report which the provider will then review and we will call you with those results. Important tips:  Avoid showering during the first 24 hours of wearing the monitor. Avoid excessive sweating to help maximize wear time. Do not submerge the device, no hot tubs, and no swimming pools. Keep any lotions or oils away from the patch. After 24 hours you may shower with the patch on. Take brief showers with your back facing the shower head.  Do not remove patch once it has been placed because that will interrupt data and decrease adhesive wear time. Push the button when you have any symptoms and write down what you were feeling. Once you have completed wearing your monitor, remove and place into box which has postage paid and place in your outgoing mailbox.  If for some reason you have misplaced your box then call our office and we can provide another box and/or mail it off for you.   Follow-Up: Your physician recommends that you schedule  a follow-up appointment in: 6 months  Any Other Special Instructions Will Be Listed Below (If Applicable). Thank you for choosing [facility]!      If you need a refill on your cardiac medications before your next appointment, please call your pharmacy.

## 2023-02-17 NOTE — Progress Notes (Signed)
 Cardiology Office Note  Date: 02/17/2023   ID: Kimberly [name], DOB 22-Jan-1965, MRN [id number]  PCP:  Kimberly [name] LABOR, MD  Cardiologist:  Diannah SHAUNNA Maywood, MD Electrophysiologist:  None    History of Present Illness: Kimberly [name] is a 58 y.o. female known to have CAD s/p one-vessel CABG in 2017 (LIMA to LAD) with low normal LVEF, DM 2, history of left eye stroke, asthma, nicotine  abuse, HLD presented to cardiology clinic for follow-up visit.  NM stress test in 06/2021 showed no evidence of ischemia, prior MI and nuclear LVEF 43%.  Subsequent echocardiogram in 06/2021 showed LVEF 50 to 55%, indeterminate LV diastology, and mild MR. ABIs are within normal limits.  She continues to have dizziness, exertional in nature and sometimes at rest.  Occurs almost daily.  HR was 51 in the last clinic visit, currently 66 bpm.  No presyncope or syncope.  She had stroke in the left eye.  She tried meclizine  which helped somewhat but not completely.  No angina, DOE, palpitations, leg swelling.  Currently smoking 5 to 6 cigarettes/day.  Not on nicotine  patches due to interactions with Adderall.  Past Medical History:  Diagnosis Date   Asthma    CAD (coronary artery disease) 04/06/2015   CABG with LIMA-LAD   Diabetes mellitus without complication (HCC)    Myocardial infarction (HCC)    Approx 2014 per pt report, no further details available   Ovarian cancer (HCC)    Stage 2 chronic kidney disease     Past Surgical History:  Procedure Laterality Date   ABDOMINAL HYSTERECTOMY     ovarian cancer   APPENDECTOMY     CARDIAC CATHETERIZATION N/A 04/08/2015   Procedure: Left Heart Cath and Coronary Angiography;  Surgeon: Candyce GORMAN Reek, MD; LAD 95%, single vessel disease    COLONOSCOPY WITH PROPOFOL  N/A 02/04/2023   Procedure: COLONOSCOPY WITH PROPOFOL ;  Surgeon: Cinderella Deatrice FALCON, MD;  [facility];  Service: Endoscopy;  Laterality: N/A;  8:15AM;ASA 3   CORONARY ARTERY BYPASS GRAFT  N/A 04/11/2015   Procedure: CORONARY ARTERY BYPASS GRAFTING (CABG) x one using left internal mamary artery. ;  Surgeon: Maude Fleeta Ochoa, MD;  LIMA-LAD   POLYPECTOMY  02/04/2023   Procedure: POLYPECTOMY;  Surgeon: Cinderella Deatrice FALCON, MD;  [facility]: [facility];  Service: Endoscopy;;   TEE WITHOUT CARDIOVERSION N/A 04/11/2015   Procedure: TRANSESOPHAGEAL ECHOCARDIOGRAM (TEE);  Surgeon: Maude Fleeta Ochoa, MD;  Location: [facility] OR;  Service: Open Heart Surgery;  Laterality: N/A;    Current Outpatient Medications  Medication Sig Dispense Refill   albuterol  (PROVENTIL  HFA;VENTOLIN  HFA) 108 (90 Base) MCG/ACT inhaler Inhale 1 puff into the lungs every 6 (six) hours as needed for wheezing or shortness of breath. 54 g 3   albuterol  (PROVENTIL ) (2.5 MG/3ML) 0.083% nebulizer solution Take 3 mLs (2.5 mg total) by nebulization every 6 (six) hours as needed for wheezing or shortness of breath. 75 mL 12   amphetamine -dextroamphetamine  (ADDERALL) 20 MG tablet Take 20 mg by mouth 3 (three) times daily.     aspirin  EC 81 MG tablet Take 81 mg by mouth daily. Swallow whole.     Blood Glucose Monitoring Suppl (TRUE METRIX METER) w/Device KIT Use as directed 1 kit 0   citalopram  (CELEXA ) 20 MG tablet Take 20 mg by mouth daily.     cloNIDine (CATAPRES) 0.1 MG tablet Take 0.2 mg by mouth every morning.     Continuous Glucose Receiver (DEXCOM G7 RECEIVER) DEVI  Continuous Glucose Sensor (DEXCOM G7 SENSOR) MISC CHANGE EVERY 10 DAYS     escitalopram (LEXAPRO) 10 MG tablet Take 1 tablet by mouth daily.     ezetimibe  (ZETIA ) 10 MG tablet Take 1 tablet (10 mg total) by mouth daily. 90 tablet 2   FARXIGA 10 MG TABS tablet Take 10 mg by mouth every morning.     glucose blood (TRUE METRIX BLOOD GLUCOSE TEST) test strip Use as instructed 100 each 12   HUMALOG 100 UNIT/ML injection Inject into the skin as directed.     HYDROcodone -acetaminophen  (NORCO) 7.5-325 MG tablet Take 1 tablet by mouth 2 (two) times daily.     hydrOXYzine   (VISTARIL ) 25 MG capsule Take 25 mg by mouth 3 (three) times daily as needed.     Insulin  Disposable Pump (OMNIPOD 5 G6 INTRO, GEN 5,) KIT by Does not apply route.     lidocaine  (LIDODERM ) 5 % SMARTSIG:Topical     lisinopril  (ZESTRIL ) 20 MG tablet Take 20 mg by mouth daily.     meclizine  (ANTIVERT ) 50 MG tablet TAKE 0.5 TABLETS (25 MG TOTAL) BY MOUTH 3 (THREE) TIMES DAILY AS NEEDED. 45 tablet 1   metoprolol  tartrate (LOPRESSOR ) 50 MG tablet TAKE 1 TABLET BY MOUTH TWICE A DAY 180 tablet 1   omeprazole (PRILOSEC) 40 MG capsule Take 40 mg by mouth daily.     OZEMPIC, 0.25 OR 0.5 MG/DOSE, 2 MG/3ML SOPN Inject 0.5 mg into the skin once a week.     pregabalin (LYRICA) 25 MG capsule Take 25 mg by mouth 2 (two) times daily.     rosuvastatin  (CRESTOR ) 40 MG tablet TAKE 1 TABLET BY MOUTH EVERYDAY AT BEDTIME 90 tablet 1   tiZANidine (ZANAFLEX) 4 MG tablet Take 4 mg by mouth 2 (two) times daily.     TRUEPLUS LANCETS 28G MISC Use as directed 100 each 12   desvenlafaxine (PRISTIQ) 100 MG 24 hr tablet Take 100 mg by mouth daily. (Patient not taking: Reported on 02/17/2023)     diclofenac Sodium (VOLTAREN) 1 % GEL Gram(s) Topical 4 Times Daily (Patient not taking: Reported on 02/17/2023)     No current facility-administered medications for this visit.   Allergies:  Atomoxetine, Oxycodone , Oxycodone -acetaminophen , Phenazopyridine, Strattera [atomoxetine hcl], and Pyridium [phenazopyridine hcl]   Social History: The patient  reports that she has been smoking cigarettes. She started smoking about 43 years ago. She has a 21.7 pack-year smoking history. She has never used smokeless tobacco. She reports current alcohol use of about 3.0 standard drinks of alcohol per week. She reports that she does not use drugs.   Family History: The patient's family history includes Diabetes in her mother; Heart disease in her maternal grandfather.   ROS:  Please see the history of present illness. Otherwise, complete review of  systems is positive for none.  All other systems are reviewed and negative.   Physical Exam: VS:  BP 136/68 (BP Location: Left Arm, Patient Position: Sitting, Cuff Size: Normal)   Pulse 66   Ht 5' 6 (1.676 m)   Wt 190 lb 3.2 oz (86.3 kg)   SpO2 99%   BMI 30.70 kg/m , BMI Body mass index is 30.7 kg/m.  Wt Readings from Last 3 Encounters:  02/17/23 190 lb 3.2 oz (86.3 kg)  02/04/23 178 lb (80.7 kg)  02/02/23 178 lb (80.7 kg)    General: Patient appears comfortable at rest. HEENT: Conjunctiva and lids normal, oropharynx clear with moist mucosa. Neck: Supple, no elevated JVP or  carotid bruits, no thyromegaly. Lungs: Clear to auscultation, nonlabored breathing at rest. Cardiac: Regular rate and rhythm, no S3 or significant systolic murmur, no pericardial rub. Abdomen: Soft, nontender, no hepatomegaly, bowel sounds present, no guarding or rebound. Extremities: No pitting edema, distal pulses 2+. Skin: Warm and dry. Musculoskeletal: No kyphosis. Neuropsychiatric: Alert and oriented x3, affect grossly appropriate.  Recent Labwork: No results found for requested labs within last 365 days.     Component Value Date/Time   CHOL 262 (H) 06/12/2021 0958   TRIG 128 06/12/2021 0958   HDL 60 06/12/2021 0958   CHOLHDL 4.4 06/12/2021 0958   CHOLHDL 4.3 04/06/2015 1816   VLDL 53 (H) 04/06/2015 1816   LDLCALC 179 (H) 06/12/2021 0958    Other Studies Reviewed Today: Echocardiogram from 06/2021 LVEF 50 to 55% Indeterminant diastology Mild MR  NM stress testing 06/2021 No evidence of ischemia Prior MI Nuclear LVEF 43%  Assessment and Plan:   # CAD s/p one-vessel CABG in 2017 (LIMA to LAD) with low normal LVEF, angina free -Angina free, continue cardioprotective medications, refill aspirin  81 mg once daily, continue rosuvastatin  40 mg nightly.  ER precautions for chest pain provided.  # Dizziness likely from left eye stroke, rule out vertigo -Ongoing dizziness with exertion and at  rest.  No presyncope or syncope.  Obtain 2-week event monitor.  # HLD, currently not at goal -Lipid panel from March 2024 reviewed, LDL 149 and TG 213, both elevated.  No myalgias on rosuvastatin  40 mg nightly and Zetia  10 mg once daily.  Obtain lipid panel.  Goal LDL less than 70.  # HTN, controlled -Continue current antihypertensives, follows with PCP.  # Nicotine  abuse -Smokes 5 to 6 cigarettes/day.  Smoking cessation counseling provided.  Cannot be on nicotine  patches due to interactions with Adderall per her PCP.   Medication Adjustments/Labs and Tests Ordered: Current medicines are reviewed at length with the patient today.  Concerns regarding medicines are outlined above.   Tests Ordered: Orders Placed This Encounter  Procedures   EKG 12-Lead    Medication Changes: No orders of the defined types were placed in this encounter.   Disposition:  Follow up  6 months  Signed Kebra Lowrimore Priya Finch Costanzo, MD, 02/17/2023 8:56 AM    [facility] at [facility] [address] [place], [place], [place] [address]

## 2023-02-18 DIAGNOSIS — R42 Dizziness and giddiness: Secondary | ICD-10-CM | POA: Diagnosis not present

## 2023-02-24 DIAGNOSIS — M2550 Pain in unspecified joint: Secondary | ICD-10-CM | POA: Diagnosis not present

## 2023-02-24 DIAGNOSIS — M47812 Spondylosis without myelopathy or radiculopathy, cervical region: Secondary | ICD-10-CM | POA: Diagnosis not present

## 2023-02-24 DIAGNOSIS — M5412 Radiculopathy, cervical region: Secondary | ICD-10-CM | POA: Diagnosis not present

## 2023-02-27 ENCOUNTER — Other Ambulatory Visit: Payer: Self-pay | Admitting: Internal Medicine

## 2023-03-15 DIAGNOSIS — E785 Hyperlipidemia, unspecified: Secondary | ICD-10-CM | POA: Diagnosis not present

## 2023-03-15 DIAGNOSIS — Z0000 Encounter for general adult medical examination without abnormal findings: Secondary | ICD-10-CM | POA: Diagnosis not present

## 2023-03-15 DIAGNOSIS — I700 Atherosclerosis of aorta: Secondary | ICD-10-CM | POA: Diagnosis not present

## 2023-03-15 DIAGNOSIS — N182 Chronic kidney disease, stage 2 (mild): Secondary | ICD-10-CM | POA: Diagnosis not present

## 2023-03-15 DIAGNOSIS — J449 Chronic obstructive pulmonary disease, unspecified: Secondary | ICD-10-CM | POA: Diagnosis not present

## 2023-03-15 DIAGNOSIS — I10 Essential (primary) hypertension: Secondary | ICD-10-CM | POA: Diagnosis not present

## 2023-03-15 DIAGNOSIS — E1122 Type 2 diabetes mellitus with diabetic chronic kidney disease: Secondary | ICD-10-CM | POA: Diagnosis not present

## 2023-03-15 DIAGNOSIS — G5601 Carpal tunnel syndrome, right upper limb: Secondary | ICD-10-CM | POA: Diagnosis not present

## 2023-03-15 DIAGNOSIS — K219 Gastro-esophageal reflux disease without esophagitis: Secondary | ICD-10-CM | POA: Diagnosis not present

## 2023-03-25 ENCOUNTER — Other Ambulatory Visit: Payer: Self-pay | Admitting: Internal Medicine

## 2023-04-20 DIAGNOSIS — Z79899 Other long term (current) drug therapy: Secondary | ICD-10-CM | POA: Diagnosis not present

## 2023-04-29 DIAGNOSIS — Z20822 Contact with and (suspected) exposure to covid-19: Secondary | ICD-10-CM | POA: Diagnosis not present

## 2023-04-29 DIAGNOSIS — N189 Chronic kidney disease, unspecified: Secondary | ICD-10-CM | POA: Diagnosis not present

## 2023-04-29 DIAGNOSIS — E1122 Type 2 diabetes mellitus with diabetic chronic kidney disease: Secondary | ICD-10-CM | POA: Diagnosis not present

## 2023-04-29 DIAGNOSIS — I129 Hypertensive chronic kidney disease with stage 1 through stage 4 chronic kidney disease, or unspecified chronic kidney disease: Secondary | ICD-10-CM | POA: Diagnosis not present

## 2023-04-29 DIAGNOSIS — F17210 Nicotine dependence, cigarettes, uncomplicated: Secondary | ICD-10-CM | POA: Diagnosis not present

## 2023-04-29 DIAGNOSIS — R058 Other specified cough: Secondary | ICD-10-CM | POA: Diagnosis not present

## 2023-04-29 DIAGNOSIS — J209 Acute bronchitis, unspecified: Secondary | ICD-10-CM | POA: Diagnosis not present

## 2023-04-29 DIAGNOSIS — E785 Hyperlipidemia, unspecified: Secondary | ICD-10-CM | POA: Diagnosis not present

## 2023-04-29 DIAGNOSIS — B9689 Other specified bacterial agents as the cause of diseases classified elsewhere: Secondary | ICD-10-CM | POA: Diagnosis not present

## 2023-04-29 DIAGNOSIS — E1140 Type 2 diabetes mellitus with diabetic neuropathy, unspecified: Secondary | ICD-10-CM | POA: Diagnosis not present

## 2023-04-29 DIAGNOSIS — Z720 Tobacco use: Secondary | ICD-10-CM | POA: Diagnosis not present

## 2023-04-29 DIAGNOSIS — I2510 Atherosclerotic heart disease of native coronary artery without angina pectoris: Secondary | ICD-10-CM | POA: Diagnosis not present

## 2023-04-29 DIAGNOSIS — J449 Chronic obstructive pulmonary disease, unspecified: Secondary | ICD-10-CM | POA: Diagnosis not present

## 2023-04-30 ENCOUNTER — Other Ambulatory Visit: Payer: Self-pay | Admitting: Internal Medicine

## 2023-05-18 DIAGNOSIS — M2550 Pain in unspecified joint: Secondary | ICD-10-CM | POA: Diagnosis not present

## 2023-05-18 DIAGNOSIS — M47812 Spondylosis without myelopathy or radiculopathy, cervical region: Secondary | ICD-10-CM | POA: Diagnosis not present

## 2023-05-18 DIAGNOSIS — M5412 Radiculopathy, cervical region: Secondary | ICD-10-CM | POA: Diagnosis not present

## 2023-05-18 DIAGNOSIS — R42 Dizziness and giddiness: Secondary | ICD-10-CM | POA: Diagnosis not present

## 2023-05-26 DIAGNOSIS — M545 Low back pain, unspecified: Secondary | ICD-10-CM | POA: Diagnosis not present

## 2023-06-03 ENCOUNTER — Other Ambulatory Visit: Payer: Self-pay | Admitting: Internal Medicine

## 2023-06-04 DIAGNOSIS — M48061 Spinal stenosis, lumbar region without neurogenic claudication: Secondary | ICD-10-CM | POA: Diagnosis not present

## 2023-06-04 DIAGNOSIS — M545 Low back pain, unspecified: Secondary | ICD-10-CM | POA: Diagnosis not present

## 2023-06-04 DIAGNOSIS — M4807 Spinal stenosis, lumbosacral region: Secondary | ICD-10-CM | POA: Diagnosis not present

## 2023-06-11 DIAGNOSIS — R202 Paresthesia of skin: Secondary | ICD-10-CM | POA: Diagnosis not present

## 2023-06-11 DIAGNOSIS — M4722 Other spondylosis with radiculopathy, cervical region: Secondary | ICD-10-CM | POA: Diagnosis not present

## 2023-06-11 DIAGNOSIS — M545 Low back pain, unspecified: Secondary | ICD-10-CM | POA: Diagnosis not present

## 2023-06-11 DIAGNOSIS — R200 Anesthesia of skin: Secondary | ICD-10-CM | POA: Diagnosis not present

## 2023-06-23 ENCOUNTER — Encounter: Payer: Self-pay | Admitting: Neurology

## 2023-06-23 ENCOUNTER — Other Ambulatory Visit: Payer: Self-pay

## 2023-06-23 DIAGNOSIS — R202 Paresthesia of skin: Secondary | ICD-10-CM

## 2023-07-04 ENCOUNTER — Other Ambulatory Visit: Payer: Self-pay | Admitting: Internal Medicine

## 2023-07-12 DIAGNOSIS — Z79899 Other long term (current) drug therapy: Secondary | ICD-10-CM | POA: Diagnosis not present

## 2023-07-20 DIAGNOSIS — Z0000 Encounter for general adult medical examination without abnormal findings: Secondary | ICD-10-CM | POA: Diagnosis not present

## 2023-07-21 DIAGNOSIS — M4722 Other spondylosis with radiculopathy, cervical region: Secondary | ICD-10-CM | POA: Diagnosis not present

## 2023-07-21 DIAGNOSIS — M545 Low back pain, unspecified: Secondary | ICD-10-CM | POA: Diagnosis not present

## 2023-08-02 ENCOUNTER — Ambulatory Visit (INDEPENDENT_AMBULATORY_CARE_PROVIDER_SITE_OTHER): Admitting: Neurology

## 2023-08-02 DIAGNOSIS — R202 Paresthesia of skin: Secondary | ICD-10-CM | POA: Diagnosis not present

## 2023-08-02 NOTE — Procedures (Signed)
 [facility]  [address] [place], [address]  [place], [place] [address] Tel: [phone] Fax: [phone] Test Date:  08/02/2023  Patient: Kimberly [name] DOB: [birth year] Physician: Venetia Potters, MD  Sex: Female Height: 5' 6 Ref Phys: Manus Gobble, MD  ID#: [id number]   Technician:    History: This is a 58 year old female with low back pain and bilateral sciatica symptoms.   NCV & EMG Findings: Electrodiagnostic evaluation was limited to nerve conduction studies due to patient declining needle examination (needle phobia). Patient had a similar response to RUE EMG on 05/25/22. The nerve conduction studies show: Bilateral sural and superficial peroneal/fibular sensory responses are within normal limits. Bilateral peroneal/fibular (EDB) and tibial (AH) motor responses are within normal limits. Bilateral H reflex appears to be absent. Patient did not well tolerate these studies though, which may limit the ability to obtain the response.  Impression: As mentioned above, the study was limited to nerve conductions studies as patient declined needle examination due to fear of needles. The limited findings are: No electrodiagnostic evidence of a large fiber sensorimotor neuropathy. Screening studies for right or left peroneal, tibial, or sciatic mononeuropathy are normal.    ___________________________ Venetia Potters, MD    Nerve Conduction Studies Motor Nerve Results    Latency Amplitude F-Lat Segment Distance CV Comment  Site (ms) Norm (mV) Norm (ms)  (cm) (m/s) Norm   Left Fibular (EDB) Motor  Ankle 3.0  < 6.0 3.9  > 2.5        Bel fib head 10.0 - 3.4 -  Bel fib head-Ankle 32 46  > 40   Pop fossa 11.9 - 3.2 -  Pop fossa-Bel fib head 9 47 -   Right Fibular (EDB) Motor  Ankle 2.8  < 6.0 5.1  > 2.5        Bel fib head 9.9 - 4.4 -  Bel fib head-Ankle 33 46  > 40   Pop fossa 11.8 - 4.3 -  Pop fossa-Bel fib head 9 47 -   Left Tibial (AH) Motor  Ankle 3.4  < 6.0 9.9  > 4.0         Knee 11.7 - 8.6 -  Knee-Ankle 41 49  > 40   Right Tibial (AH) Motor  Ankle 3.4  < 6.0 7.3  > 4.0        Knee 11.8 - 5.6 -  Knee-Ankle 43 51  > 40    Sensory Sites    Neg Peak Lat Amplitude (O-P) Segment Distance Velocity Comment  Site (ms) Norm (V) Norm  (cm) (ms)   Left Superficial Fibular Sensory  14 cm-Ankle 2.7  < 4.6 9  > 4 14 cm-Ankle 14    Right Superficial Fibular Sensory  14 cm-Ankle 3.2  < 4.6 5  > 4 14 cm-Ankle 14    Left Sural Sensory  Calf-Lat mall 3.0  < 4.6 4  > 4 Calf-Lat mall 14    Right Sural Sensory  Calf-Lat mall 3.3  < 4.6 7  > 4 Calf-Lat mall 14     H-Reflex Results    M-Lat H Lat H Neg Amp H-M Lat  Site (ms) (ms) Norm (mV) (ms)  Left Tibial H-Reflex  Pop fossa 4.6 -  < 35.0 - -  Right Tibial H-Reflex  Pop fossa 4.6 -  < 35.0 - -      Waveforms:  Motor           Sensory  H-Reflex

## 2023-08-03 DIAGNOSIS — M545 Low back pain, unspecified: Secondary | ICD-10-CM | POA: Diagnosis not present

## 2023-08-03 DIAGNOSIS — M4722 Other spondylosis with radiculopathy, cervical region: Secondary | ICD-10-CM | POA: Diagnosis not present

## 2023-08-05 DIAGNOSIS — M545 Low back pain, unspecified: Secondary | ICD-10-CM | POA: Diagnosis not present

## 2023-08-05 DIAGNOSIS — M4722 Other spondylosis with radiculopathy, cervical region: Secondary | ICD-10-CM | POA: Diagnosis not present

## 2023-08-11 DIAGNOSIS — R29818 Other symptoms and signs involving the nervous system: Secondary | ICD-10-CM | POA: Diagnosis not present

## 2023-08-11 DIAGNOSIS — R42 Dizziness and giddiness: Secondary | ICD-10-CM | POA: Diagnosis not present

## 2023-08-11 DIAGNOSIS — Z79899 Other long term (current) drug therapy: Secondary | ICD-10-CM | POA: Diagnosis not present

## 2023-08-18 DIAGNOSIS — M545 Low back pain, unspecified: Secondary | ICD-10-CM | POA: Diagnosis not present

## 2023-08-18 DIAGNOSIS — M4722 Other spondylosis with radiculopathy, cervical region: Secondary | ICD-10-CM | POA: Diagnosis not present

## 2023-08-18 DIAGNOSIS — M5416 Radiculopathy, lumbar region: Secondary | ICD-10-CM | POA: Diagnosis not present

## 2023-08-18 DIAGNOSIS — M5412 Radiculopathy, cervical region: Secondary | ICD-10-CM | POA: Diagnosis not present

## 2023-08-18 DIAGNOSIS — M2550 Pain in unspecified joint: Secondary | ICD-10-CM | POA: Diagnosis not present

## 2023-08-24 DIAGNOSIS — R9082 White matter disease, unspecified: Secondary | ICD-10-CM | POA: Diagnosis not present

## 2023-08-24 DIAGNOSIS — J320 Chronic maxillary sinusitis: Secondary | ICD-10-CM | POA: Diagnosis not present

## 2023-08-24 DIAGNOSIS — Z8673 Personal history of transient ischemic attack (TIA), and cerebral infarction without residual deficits: Secondary | ICD-10-CM | POA: Diagnosis not present

## 2023-08-24 DIAGNOSIS — R42 Dizziness and giddiness: Secondary | ICD-10-CM | POA: Diagnosis not present

## 2023-08-26 DIAGNOSIS — M4722 Other spondylosis with radiculopathy, cervical region: Secondary | ICD-10-CM | POA: Diagnosis not present

## 2023-08-26 DIAGNOSIS — M545 Low back pain, unspecified: Secondary | ICD-10-CM | POA: Diagnosis not present

## 2023-08-28 ENCOUNTER — Other Ambulatory Visit: Payer: Self-pay | Admitting: Internal Medicine

## 2023-08-30 ENCOUNTER — Other Ambulatory Visit: Payer: Self-pay | Admitting: Internal Medicine

## 2023-09-09 DIAGNOSIS — M79642 Pain in left hand: Secondary | ICD-10-CM | POA: Diagnosis not present

## 2023-09-09 DIAGNOSIS — M79641 Pain in right hand: Secondary | ICD-10-CM | POA: Diagnosis not present

## 2023-09-22 DIAGNOSIS — Z1231 Encounter for screening mammogram for malignant neoplasm of breast: Secondary | ICD-10-CM | POA: Diagnosis not present

## 2023-10-08 DIAGNOSIS — M25511 Pain in right shoulder: Secondary | ICD-10-CM | POA: Diagnosis not present

## 2023-10-26 DIAGNOSIS — E1122 Type 2 diabetes mellitus with diabetic chronic kidney disease: Secondary | ICD-10-CM | POA: Diagnosis not present

## 2023-10-26 DIAGNOSIS — E7849 Other hyperlipidemia: Secondary | ICD-10-CM | POA: Diagnosis not present

## 2023-10-26 DIAGNOSIS — J449 Chronic obstructive pulmonary disease, unspecified: Secondary | ICD-10-CM | POA: Diagnosis not present

## 2023-10-26 DIAGNOSIS — L84 Corns and callosities: Secondary | ICD-10-CM | POA: Diagnosis not present

## 2023-10-26 DIAGNOSIS — M4800 Spinal stenosis, site unspecified: Secondary | ICD-10-CM | POA: Diagnosis not present

## 2023-10-26 DIAGNOSIS — N182 Chronic kidney disease, stage 2 (mild): Secondary | ICD-10-CM | POA: Diagnosis not present

## 2023-12-24 ENCOUNTER — Other Ambulatory Visit: Payer: Self-pay | Admitting: Internal Medicine

## 2024-01-23 ENCOUNTER — Other Ambulatory Visit: Payer: Self-pay | Admitting: Internal Medicine

## 2024-02-29 ENCOUNTER — Ambulatory Visit: Admitting: Internal Medicine
